# Patient Record
Sex: Female | Born: 1940
Health system: Southern US, Community
[De-identification: ages and names within clinical notes are randomized; demographics above are authoritative.]

## PROBLEM LIST (undated history)

## (undated) ENCOUNTER — Emergency Department (HOSPITAL_COMMUNITY): Admission: EM | Payer: Medicare Other | Source: Home / Self Care

## (undated) DIAGNOSIS — G4719 Other hypersomnia: Secondary | ICD-10-CM

## (undated) DIAGNOSIS — K760 Fatty (change of) liver, not elsewhere classified: Secondary | ICD-10-CM

## (undated) DIAGNOSIS — I2510 Atherosclerotic heart disease of native coronary artery without angina pectoris: Secondary | ICD-10-CM

## (undated) DIAGNOSIS — I679 Cerebrovascular disease, unspecified: Secondary | ICD-10-CM

## (undated) DIAGNOSIS — K219 Gastro-esophageal reflux disease without esophagitis: Secondary | ICD-10-CM

## (undated) DIAGNOSIS — I480 Paroxysmal atrial fibrillation: Secondary | ICD-10-CM

## (undated) DIAGNOSIS — Z950 Presence of cardiac pacemaker: Secondary | ICD-10-CM

## (undated) DIAGNOSIS — I10 Essential (primary) hypertension: Secondary | ICD-10-CM

## (undated) DIAGNOSIS — R269 Unspecified abnormalities of gait and mobility: Secondary | ICD-10-CM

## (undated) DIAGNOSIS — E785 Hyperlipidemia, unspecified: Secondary | ICD-10-CM

## (undated) DIAGNOSIS — I495 Sick sinus syndrome: Secondary | ICD-10-CM

## (undated) DIAGNOSIS — D126 Benign neoplasm of colon, unspecified: Secondary | ICD-10-CM

## (undated) DIAGNOSIS — M4800 Spinal stenosis, site unspecified: Secondary | ICD-10-CM

## (undated) DIAGNOSIS — K449 Diaphragmatic hernia without obstruction or gangrene: Secondary | ICD-10-CM

## (undated) DIAGNOSIS — K317 Polyp of stomach and duodenum: Secondary | ICD-10-CM

## (undated) DIAGNOSIS — G4710 Hypersomnia, unspecified: Secondary | ICD-10-CM

## (undated) DIAGNOSIS — G40909 Epilepsy, unspecified, not intractable, without status epilepticus: Secondary | ICD-10-CM

## (undated) DIAGNOSIS — N390 Urinary tract infection, site not specified: Secondary | ICD-10-CM

## (undated) HISTORY — DX: Polyp of stomach and duodenum: K31.7

## (undated) HISTORY — DX: Gastro-esophageal reflux disease without esophagitis: K21.9

## (undated) HISTORY — DX: Hyperlipidemia, unspecified: E78.5

## (undated) HISTORY — DX: Essential (primary) hypertension: I10

## (undated) HISTORY — DX: Unspecified abnormalities of gait and mobility: R26.9

## (undated) HISTORY — DX: Hypersomnia, unspecified: G47.10

## (undated) HISTORY — DX: Cerebrovascular disease, unspecified: I67.9

## (undated) HISTORY — DX: Paroxysmal atrial fibrillation: I48.0

## (undated) HISTORY — DX: Other hypersomnia: G47.19

## (undated) HISTORY — PX: BACK SURGERY: SHX140

## (undated) HISTORY — DX: Spinal stenosis, site unspecified: M48.00

## (undated) HISTORY — DX: Atherosclerotic heart disease of native coronary artery without angina pectoris: I25.10

## (undated) HISTORY — DX: Sick sinus syndrome: I49.5

## (undated) HISTORY — DX: Benign neoplasm of colon, unspecified: D12.6

## (undated) HISTORY — DX: Urinary tract infection, site not specified: N39.0

## (undated) HISTORY — DX: Fatty (change of) liver, not elsewhere classified: K76.0

## (undated) HISTORY — DX: Diaphragmatic hernia without obstruction or gangrene: K44.9

## (undated) HISTORY — DX: Epilepsy, unspecified, not intractable, without status epilepticus: G40.909

## (undated) SURGERY — ECHOCARDIOGRAM, TRANSESOPHAGEAL
Anesthesia: Moderate Sedation

---

## 1984-11-05 HISTORY — PX: MITRAL VALVE REPLACEMENT: SHX147

## 1992-11-05 HISTORY — PX: AORTIC AND MITRAL VALVE REPLACEMENT: SHX5056

## 1998-02-01 ENCOUNTER — Encounter (HOSPITAL_COMMUNITY): Admission: RE | Admit: 1998-02-01 | Discharge: 1998-05-02 | Payer: Self-pay | Admitting: Cardiology

## 1998-05-20 ENCOUNTER — Encounter (HOSPITAL_COMMUNITY): Admission: RE | Admit: 1998-05-20 | Discharge: 1998-08-18 | Payer: Self-pay | Admitting: Cardiology

## 1998-12-22 ENCOUNTER — Encounter: Payer: Self-pay | Admitting: Cardiology

## 1998-12-22 ENCOUNTER — Inpatient Hospital Stay (HOSPITAL_COMMUNITY): Admission: EM | Admit: 1998-12-22 | Discharge: 1998-12-24 | Payer: Self-pay | Admitting: Emergency Medicine

## 1998-12-23 ENCOUNTER — Encounter: Payer: Self-pay | Admitting: Cardiology

## 2000-01-24 ENCOUNTER — Encounter: Payer: Self-pay | Admitting: *Deleted

## 2000-01-24 ENCOUNTER — Encounter: Admission: RE | Admit: 2000-01-24 | Discharge: 2000-01-24 | Payer: Self-pay | Admitting: *Deleted

## 2000-03-20 ENCOUNTER — Other Ambulatory Visit: Admission: RE | Admit: 2000-03-20 | Discharge: 2000-03-20 | Payer: Self-pay | Admitting: *Deleted

## 2001-03-04 ENCOUNTER — Ambulatory Visit (HOSPITAL_COMMUNITY): Admission: RE | Admit: 2001-03-04 | Discharge: 2001-03-04 | Payer: Self-pay | Admitting: Cardiology

## 2001-03-14 ENCOUNTER — Encounter: Payer: Self-pay | Admitting: *Deleted

## 2001-03-14 ENCOUNTER — Encounter: Admission: RE | Admit: 2001-03-14 | Discharge: 2001-03-14 | Payer: Self-pay | Admitting: *Deleted

## 2001-06-23 ENCOUNTER — Other Ambulatory Visit: Admission: RE | Admit: 2001-06-23 | Discharge: 2001-06-23 | Payer: Self-pay | Admitting: *Deleted

## 2001-12-19 ENCOUNTER — Ambulatory Visit (HOSPITAL_BASED_OUTPATIENT_CLINIC_OR_DEPARTMENT_OTHER): Admission: RE | Admit: 2001-12-19 | Discharge: 2001-12-19 | Payer: Self-pay | Admitting: Internal Medicine

## 2002-04-23 ENCOUNTER — Encounter: Admission: RE | Admit: 2002-04-23 | Discharge: 2002-04-23 | Payer: Self-pay | Admitting: *Deleted

## 2002-04-23 ENCOUNTER — Encounter: Payer: Self-pay | Admitting: *Deleted

## 2003-02-02 ENCOUNTER — Encounter: Payer: Self-pay | Admitting: Neurology

## 2003-02-02 ENCOUNTER — Encounter: Admission: RE | Admit: 2003-02-02 | Discharge: 2003-02-02 | Payer: Self-pay | Admitting: Neurology

## 2003-12-03 ENCOUNTER — Encounter: Admission: RE | Admit: 2003-12-03 | Discharge: 2003-12-03 | Payer: Self-pay | Admitting: Family Medicine

## 2003-12-12 ENCOUNTER — Encounter: Admission: RE | Admit: 2003-12-12 | Discharge: 2003-12-12 | Payer: Self-pay | Admitting: Family Medicine

## 2004-01-17 ENCOUNTER — Inpatient Hospital Stay (HOSPITAL_COMMUNITY): Admission: RE | Admit: 2004-01-17 | Discharge: 2004-01-25 | Payer: Self-pay | Admitting: Cardiology

## 2004-12-11 ENCOUNTER — Encounter: Admission: RE | Admit: 2004-12-11 | Discharge: 2004-12-11 | Payer: Self-pay | Admitting: Neurology

## 2006-04-04 ENCOUNTER — Encounter: Admission: RE | Admit: 2006-04-04 | Discharge: 2006-04-04 | Payer: Self-pay | Admitting: Cardiology

## 2006-04-19 HISTORY — PX: NM MYOCAR PERF WALL MOTION: HXRAD629

## 2007-06-02 ENCOUNTER — Encounter: Admission: RE | Admit: 2007-06-02 | Discharge: 2007-06-27 | Payer: Self-pay | Admitting: Orthopedic Surgery

## 2007-09-09 ENCOUNTER — Encounter: Admission: RE | Admit: 2007-09-09 | Discharge: 2007-09-09 | Payer: Self-pay | Admitting: Cardiology

## 2008-03-31 HISTORY — PX: NM MYOCAR PERF WALL MOTION: HXRAD629

## 2008-04-02 ENCOUNTER — Encounter: Admission: RE | Admit: 2008-04-02 | Discharge: 2008-04-02 | Payer: Self-pay | Admitting: Cardiology

## 2008-04-09 ENCOUNTER — Encounter (INDEPENDENT_AMBULATORY_CARE_PROVIDER_SITE_OTHER): Payer: Self-pay | Admitting: Cardiology

## 2008-04-09 ENCOUNTER — Ambulatory Visit (HOSPITAL_COMMUNITY): Admission: RE | Admit: 2008-04-09 | Discharge: 2008-04-09 | Payer: Self-pay | Admitting: Cardiology

## 2009-04-14 ENCOUNTER — Encounter: Admission: RE | Admit: 2009-04-14 | Discharge: 2009-04-14 | Payer: Self-pay | Admitting: Neurology

## 2010-04-05 HISTORY — PX: CARDIAC CATHETERIZATION: SHX172

## 2010-04-26 ENCOUNTER — Inpatient Hospital Stay (HOSPITAL_COMMUNITY): Admission: AD | Admit: 2010-04-26 | Discharge: 2010-05-11 | Payer: Self-pay | Admitting: Cardiology

## 2010-04-27 ENCOUNTER — Encounter (INDEPENDENT_AMBULATORY_CARE_PROVIDER_SITE_OTHER): Payer: Self-pay | Admitting: Cardiology

## 2010-05-02 ENCOUNTER — Encounter (INDEPENDENT_AMBULATORY_CARE_PROVIDER_SITE_OTHER): Payer: Self-pay | Admitting: Cardiovascular Disease

## 2010-05-05 DIAGNOSIS — Z950 Presence of cardiac pacemaker: Secondary | ICD-10-CM

## 2010-05-05 HISTORY — PX: PACEMAKER PLACEMENT: SHX43

## 2010-05-05 HISTORY — DX: Presence of cardiac pacemaker: Z95.0

## 2010-05-15 ENCOUNTER — Emergency Department (HOSPITAL_COMMUNITY): Admission: EM | Admit: 2010-05-15 | Discharge: 2010-05-15 | Payer: Self-pay | Admitting: Emergency Medicine

## 2010-05-23 ENCOUNTER — Inpatient Hospital Stay (HOSPITAL_COMMUNITY): Admission: EM | Admit: 2010-05-23 | Discharge: 2010-05-25 | Payer: Self-pay | Admitting: Emergency Medicine

## 2010-05-23 ENCOUNTER — Ambulatory Visit: Payer: Self-pay | Admitting: Family Medicine

## 2010-06-13 ENCOUNTER — Ambulatory Visit (HOSPITAL_COMMUNITY): Admission: RE | Admit: 2010-06-13 | Discharge: 2010-06-13 | Payer: Self-pay | Admitting: Cardiology

## 2010-08-25 ENCOUNTER — Encounter (INDEPENDENT_AMBULATORY_CARE_PROVIDER_SITE_OTHER): Payer: Self-pay | Admitting: *Deleted

## 2010-10-09 ENCOUNTER — Ambulatory Visit: Payer: Self-pay | Admitting: Gastroenterology

## 2010-10-09 DIAGNOSIS — K7689 Other specified diseases of liver: Secondary | ICD-10-CM | POA: Insufficient documentation

## 2010-10-09 DIAGNOSIS — I4891 Unspecified atrial fibrillation: Secondary | ICD-10-CM | POA: Insufficient documentation

## 2010-10-16 ENCOUNTER — Encounter: Payer: Self-pay | Admitting: Gastroenterology

## 2010-10-17 ENCOUNTER — Telehealth: Payer: Self-pay | Admitting: Gastroenterology

## 2010-11-01 ENCOUNTER — Telehealth: Payer: Self-pay | Admitting: Gastroenterology

## 2010-11-02 ENCOUNTER — Encounter: Payer: Self-pay | Admitting: Gastroenterology

## 2010-11-05 DIAGNOSIS — D126 Benign neoplasm of colon, unspecified: Secondary | ICD-10-CM

## 2010-11-05 HISTORY — DX: Benign neoplasm of colon, unspecified: D12.6

## 2010-11-10 ENCOUNTER — Other Ambulatory Visit: Payer: Self-pay | Admitting: Gastroenterology

## 2010-11-10 ENCOUNTER — Ambulatory Visit
Admission: RE | Admit: 2010-11-10 | Discharge: 2010-11-10 | Payer: Self-pay | Source: Home / Self Care | Attending: Gastroenterology | Admitting: Gastroenterology

## 2010-11-10 LAB — CBC WITH DIFFERENTIAL/PLATELET
Basophils Absolute: 0 10*3/uL (ref 0.0–0.1)
Basophils Relative: 0.4 % (ref 0.0–3.0)
Eosinophils Absolute: 0.1 10*3/uL (ref 0.0–0.7)
Eosinophils Relative: 1.9 % (ref 0.0–5.0)
HCT: 35 % — ABNORMAL LOW (ref 36.0–46.0)
Hemoglobin: 12 g/dL (ref 12.0–15.0)
Lymphocytes Relative: 23.9 % (ref 12.0–46.0)
Lymphs Abs: 1 10*3/uL (ref 0.7–4.0)
MCHC: 34.4 g/dL (ref 30.0–36.0)
MCV: 94.6 fl (ref 78.0–100.0)
Monocytes Absolute: 0.2 10*3/uL (ref 0.1–1.0)
Monocytes Relative: 5.9 % (ref 3.0–12.0)
Neutro Abs: 2.8 10*3/uL (ref 1.4–7.7)
Neutrophils Relative %: 67.9 % (ref 43.0–77.0)
Platelets: 164 10*3/uL (ref 150.0–400.0)
RBC: 3.7 Mil/uL — ABNORMAL LOW (ref 3.87–5.11)
RDW: 12.6 % (ref 11.5–14.6)
WBC: 4.1 10*3/uL — ABNORMAL LOW (ref 4.5–10.5)

## 2010-11-10 LAB — PROTIME-INR
INR: 3.6 ratio — ABNORMAL HIGH (ref 0.8–1.0)
Prothrombin Time: 35.5 s — ABNORMAL HIGH (ref 10.2–12.4)

## 2010-11-14 ENCOUNTER — Ambulatory Visit
Admission: RE | Admit: 2010-11-14 | Discharge: 2010-11-14 | Payer: Self-pay | Source: Home / Self Care | Attending: Gastroenterology | Admitting: Gastroenterology

## 2010-11-14 ENCOUNTER — Other Ambulatory Visit: Payer: Self-pay | Admitting: Gastroenterology

## 2010-11-14 LAB — COMPREHENSIVE METABOLIC PANEL
ALT: 26 U/L (ref 0–35)
AST: 43 U/L — ABNORMAL HIGH (ref 0–37)
Albumin: 3.6 g/dL (ref 3.5–5.2)
Alkaline Phosphatase: 59 U/L (ref 39–117)
BUN: 12 mg/dL (ref 6–23)
CO2: 30 mEq/L (ref 19–32)
Calcium: 9 mg/dL (ref 8.4–10.5)
Chloride: 101 mEq/L (ref 96–112)
Creatinine, Ser: 1.1 mg/dL (ref 0.4–1.2)
GFR: 55.38 mL/min — ABNORMAL LOW (ref >60.00)
Glucose, Bld: 73 mg/dL (ref 70–99)
Potassium: 3.5 mEq/L (ref 3.5–5.1)
Sodium: 141 mEq/L (ref 135–145)
Total Bilirubin: 0.7 mg/dL (ref 0.3–1.2)
Total Protein: 6.3 g/dL (ref 6.0–8.3)

## 2010-11-20 ENCOUNTER — Encounter: Payer: Self-pay | Admitting: Gastroenterology

## 2010-12-05 NOTE — Letter (Signed)
Summary: [facility] Instructions  [facility]  [address] [place], [place] [address]   Phone: [phone]  Fax: [phone]       Alice Ballard    Jan 04, 1941    MRN: [id number]        Procedure Day /Date: Tuesday January 10th, 2012     Arrival Time: 2:00pm     Procedure Time: 3:00pm     Location of Procedure:                    _x _  [facility] (4th Floor)                        PREPARATION FOR COLONOSCOPY WITH MOVIPREP   Starting 5 days prior to your procedure 11/09/10 do not eat nuts, seeds, popcorn, corn, beans, peas,  salads, or any raw vegetables.  Do not take any fiber supplements (e.g. Metamucil, Citrucel, and Benefiber).  THE DAY BEFORE YOUR PROCEDURE         DATE: 11/13/10  DAY: Monday  1.  Drink clear liquids the entire day-NO SOLID FOOD  2.  Do not drink anything colored red or purple.  Avoid juices with pulp.  No orange juice.  3.  Drink at least 64 oz. (8 glasses) of fluid/clear liquids during the day to prevent dehydration and help the prep work efficiently.  CLEAR LIQUIDS INCLUDE: Water Jello Ice Popsicles Tea (sugar ok, no milk/cream) Powdered fruit flavored drinks Coffee (sugar ok, no milk/cream) Gatorade Juice: apple, white grape, white cranberry  Lemonade Clear bullion, consomm, broth Carbonated beverages (any kind) Strained chicken noodle soup Hard Candy                             4.  In the morning, mix first dose of MoviPrep solution:    Empty 1 Pouch A and 1 Pouch B into the disposable container    Add lukewarm drinking water to the top line of the container. Mix to dissolve    Refrigerate (mixed solution should be used within 24 hrs)  5.  Begin drinking the prep at 5:00 p.m. The MoviPrep container is divided by 4 marks.   Every 15 minutes drink the solution down to the next mark (approximately 8 oz) until the full liter is complete.   6.  Follow completed prep with 16 oz of clear liquid of your choice  (Nothing red or purple).  Continue to drink clear liquids until bedtime.  7.  Before going to bed, mix second dose of MoviPrep solution:    Empty 1 Pouch A and 1 Pouch B into the disposable container    Add lukewarm drinking water to the top line of the container. Mix to dissolve    Refrigerate  THE DAY OF YOUR PROCEDURE      DATE: 11/14/10 EPP:IRJJOAC  Beginning at 10:00 a.m. (5 hours before procedure):         1. Every 15 minutes, drink the solution down to the next mark (approx 8 oz) until the full liter is complete.  2. Follow completed prep with 16 oz. of clear liquid of your choice.    3. You may drink clear liquids until 1:00pm (2 HOURS BEFORE PROCEDURE).   MEDICATION INSTRUCTIONS  Unless otherwise instructed, you should take regular prescription medications with a small sip of water   as early as possible the morning of your procedure.  Stop taking Coumadin on  _ _  (5 days before procedure).  Additional medication instructions: You will be contaced by our office prior to your procedure for directions on holding your Coumadin/Warfarin.  If you do not hear from our office 1 week prior to your scheduled procedure, please call [phone] to discuss.          OTHER INSTRUCTIONS  You will need a responsible adult at least 69 years of age to accompany you and drive you home.   This person must remain in the waiting room during your procedure.  Wear loose fitting clothing that is easily removed.  Leave jewelry and other valuables at home.  However, you may wish to bring a book to read or  an iPod/MP3 player to listen to music as you wait for your procedure to start.  Remove all body piercing jewelry and leave at home.  Total time from sign-in until discharge is approximately 2-3 hours.  You should go home directly after your procedure and rest.  You can resume normal activities the  day after your procedure.  The day of your procedure you should not:    Drive   Make legal decisions   Operate machinery   Drink alcohol   Return to work  You will receive specific instructions about eating, activities and medications before you leave.    The above instructions have been reviewed and explained to me by   Marchelle Folks.     I fully understand and can verbalize these instructions _____________________________ Date _________

## 2010-12-05 NOTE — Letter (Signed)
Summary: New Patient letter  [facility]  [address] [place], [place] [address]   Phone: [phone]  Fax: [phone]       08/25/2010 MRN: [id number]  Alice Ballard [address] [place], [place]  [address]  Dear Alice Ballard,  Welcome to the Gastroenterology Division at [facility].    You are scheduled to see Dr.  Russella Dar on 10-09-10 at 11:00a.m. on the 3rd floor at [facility], [address]  We ask that you try to arrive at our office 15 minutes prior to your appointment time to allow for check-in.  We would like you to complete the enclosed self-administered evaluation form prior to your visit and bring it with you on the day of your appointment.  We will review it with you.  Also, please bring a complete list of all your medications or, if you prefer, bring the medication bottles and we will list them.  Please bring your insurance card so that we may make a copy of it.  If your insurance requires a referral to see a specialist, please bring your referral form from your primary care physician.  Co-payments are due at the time of your visit and may be paid by cash, check or credit card.     Your office visit will consist of a consult with your physician (includes a physical exam), any laboratory testing he/she may order, scheduling of any necessary diagnostic testing (e.g. x-ray, ultrasound, CT-scan), and scheduling of a procedure (e.g. Endoscopy, Colonoscopy) if required.  Please allow enough time on your schedule to allow for any/all of these possibilities.    If you cannot keep your appointment, please call [phone] to cancel or reschedule prior to your appointment date.  This allows [place] the opportunity to schedule an appointment for another patient in need of care.  If you do not cancel or reschedule by 5 p.m. the business day prior to your appointment date, you will be charged a $50.00 late cancellation/no-show fee.    Thank you for choosing [facility] for your medical needs.  We appreciate the opportunity to care for you.  Please visit [place] at our website  to learn more about our practice.                     Sincerely,                                                             The Gastroenterology Division

## 2010-12-05 NOTE — Assessment & Plan Note (Signed)
Summary: discuss COL on BT--ch.   History of Present Illness Visit Type: Initial Consult Primary GI MD: Elie Goody MD [facility] Primary Provider: Quintin Alto, MD Requesting Provider: Quintin Alto, MD Chief Complaint: Screening colonoscopy-pt. on blood thinner History of Present Illness:   This is a 70 year old female who was referred for consideration of colorectal cancer screening. She has multiple comorbidities, including atrial fibrillation, coronary artery disease, and 2 prosthetic valves. She has a St. Jude's aortic valve replacement and mitral valve replacement. She was hospitalized in July, related to seizure medication side effects. She relates her Coumadin dosing as been adjusted multiple times recently by Dr. Clarene Duke. She notes occasional difficulty swallowing large pills and occasional dry mouth for about the past year. She denies any reflux symptoms and denies solids or liquid dysphagia.   GI Review of Systems    Reports weight loss.      Denies abdominal pain, acid reflux, belching, bloating, chest pain, dysphagia with liquids, dysphagia with solids, heartburn, loss of appetite, nausea, vomiting, vomiting blood, and  weight gain.        Denies anal fissure, black tarry stools, change in bowel habit, constipation, diarrhea, diverticulosis, fecal incontinence, heme positive stool, hemorrhoids, irritable bowel syndrome, jaundice, light color stool, liver problems, rectal bleeding, and  rectal pain. Current Medications (verified): 1)  Keppra 500 Mg Tabs (Levetiracetam) .... One Tablet By Mouth Two Times A Day 2)  Amiodarone Hcl 200 Mg Tabs (Amiodarone Hcl) .... One Tablet By Mouth Once Daily 3)  Coumadin 7.5 Mg Tabs (Warfarin Sodium) .[name].. 1 1/2 Tablet By Mouth Once Daily 4)  Lipitor 40 Mg Tabs (Atorvastatin Calcium) .... One Tablet By Mouth At Bedtime 5)  Lisinopril 20 Mg Tabs (Lisinopril) .... One Tablet By Mouth Two Times A Day 6)  Prilosec 20 Mg Cpdr (Omeprazole) .... One  Tablet By Mouth Once Daily 7)  Acetaminophen 500 Mg Tabs (Acetaminophen) .... 2 Tablets By Mouth Every 6 Hours As Needed 8)  Vitamin D3 2000 Unit Caps (Cholecalciferol) .... One Tablet By Mouth Once Daily 9)  Artificial Tears 1.4 % Soln (Polyvinyl Alcohol) .... 2 Drops in Both Eyes Once Daily As Needed 10)  Hydrochlorothiazide 25 Mg Tabs (Hydrochlorothiazide) .[name].. 1 By Mouth Once Daily 11)  Vitamin C 1000 Mg Tabs (Ascorbic Acid) .[name].. 1 By Mouth Once Daily  Allergies (verified): 1)  ! Carbamazepine (Carbamazepine)  Past History:  Past Medical History: Reviewed history from 10/05/2010 and no changes required. Atrial Fibrillation Coronary Artery Disease Hyperlipidemia Hypertension Seizures CVA GERD  Past Surgical History: Reviewed history from 10/05/2010 and no changes required. Pacemaker placement 05/2010 St. Juse aortic valve replacement 1994 Mitral valve replacement 1986 Cardiac cath 04/2010  Family History: Reviewed history and no changes required. No FH of Colon Cancer: Family History of Heart Disease:   Social History: Reviewed history and no changes required. Married 1 girl Retired Patient has never smoked.  Alcohol Use - no Daily Caffeine Use Illicit Drug Use - no Smoking Status:  never Drug Use:  no  Review of Systems       The patient complains of cough, swelling of feet/legs, and urine leakage.         The pertinent positives and negatives are noted as above and in the HPI. All other ROS were reviewed and were negative.   Vital Signs:  Patient profile:   69 year old female Height:      61.75 inches Weight:      145 pounds BMI:  26.83 Pulse rate:   76 / minute Pulse rhythm:   regular BP sitting:   138 / 70  (left arm)  Vitals Entered By: Milford Cage NCMA (October 09, 2010 11:14 AM)  Physical Exam  General:  Well developed, well nourished, no acute distress. Head:  Normocephalic and atraumatic. Eyes:  PERRLA, no icterus. Ears:  Normal  auditory acuity. Mouth:  No deformity or lesions, dentition normal. Neck:  Supple; no masses or thyromegaly. Lungs:  Clear throughout to auscultation. Heart:  Nno murmurs, rubs,  or bruits. Prosthetic valve sounds. irregular rhythm, normal rate.   Abdomen:  Soft, nontender and nondistended. No masses, hepatosplenomegaly or hernias noted. Normal bowel sounds. Rectal:  deferred until time of colonoscopy.   Msk:  Symmetrical with no gross deformities. Normal posture. Pulses:  Normal pulses noted. Extremities:  No clubbing, cyanosis, edema or deformities noted. Neurologic:  Alert and  oriented x4;  grossly normal neurologically. Cervical Nodes:  No significant cervical adenopathy. Inguinal Nodes:  No significant inguinal adenopathy. Psych:  Alert and cooperative. Normal mood and affect.  Impression & Recommendations:  Problem # 1:  SPECIAL SCREENING FOR MALIGNANT NEOPLASMS COLON (ICD-V76.51) Average risk for colorectal cancer. The risks, benefits and alternatives to colonoscopy with possible biopsy and possible polypectomy were discussed with the patient and they consent to proceed. The procedure will be scheduled electively. Orders: Colonoscopy (Colon)  Problem # 2:  LONG-TERM (CURRENT) USE OF ANTICOAGULANTS (ICD-V58.61) Long-term Coumadin anticoagulation for prosthetic aortic valve, prosthetic mitral valve and atrial fibrillation. I recommend a five-day hold of Coumadin anticoagulation to allow for possible biopsy, and possible polypectomy at the time of colonoscopy, however, she will be at increased risk of thromboembolic problems off Coumadin. I will ask Dr. Clarene Duke to review and make recommendations regarding anticoagulation management and the use of Lovenox bridging, if needed.  Problem # 3:  ATRIAL FIBRILLATION (ICD-427.31)  Patient Instructions: 1)  Pick up your prep from your pharmacy.  2)  Colonoscopy brochure given.  3)  Copy sent to : Quintin Alto, MD 4)                           Al Little, MD 5)  The medication list was reviewed and reconciled.  All changed / newly prescribed medications were explained.  A complete medication list was provided to the patient / caregiver.  Prescriptions: MOVIPREP 100 GM  SOLR (PEG-KCL-NACL-NASULF-NA ASC-C) As per prep instructions.  #1 x 0   Entered by:   Christie Nottingham CMA (AAMA)   Authorized by:   Meryl Dare MD [facility]   Signed by:   Christie Nottingham CMA (AAMA) on 10/09/2010   Method used:   Electronically to        [facility]* (retail)       [address]       [place], [place]  [address]       Ph: [phone]       Fax: [phone]   RxID:   [id number]

## 2010-12-07 NOTE — Procedures (Signed)
Summary: Colon prep  Colon prep   Imported By: Lester [place] 11/09/2010 10:04:07  _____________________________________________________________________  External Attachment:    Type:   Image     Comment:   External Document

## 2010-12-07 NOTE — Letter (Signed)
Summary: Patient Notice- Polyp Results  [facility]  [address] [place], [place] [address]   Phone: [phone]  Fax: [phone]        November 20, 2010 MRN: [id number]    [facility] [address] [place], [place]  [address]    Dear Ms. Sundquist,  I am pleased to inform you that the colon polyp removed during your recent colonoscopy was found to be benign (no cancer detected) upon pathologic examination.  I recommend you have a repeat colonoscopy examination in 5 years to look for recurrent polyps, as having colon polyps increases your risk for having recurrent polyps or even colon cancer in the future.  Should you develop new or worsening symptoms of abdominal pain, bowel habit changes or bleeding from the rectum or bowels, please schedule an evaluation with either your primary care physician or with me.  Continue treatment plan as outlined the day of your exam.  Please call us if you are having persistent problems or have questions about your condition that have not been fully answered at this time.  Sincerely,  Meryl Dare MD [facility]  This letter has been electronically signed by your physician.  Appended Document: Patient Notice- Polyp Results Letter mailed

## 2010-12-07 NOTE — Progress Notes (Signed)
Summary: Stay on coumadin  ---- Converted from flag ---- ---- 10/17/2010 8:48 AM, Meryl Dare MD [facility] wrote: She can do colonoscopy while on Coumadin but we will not be able to remove larger polyps. Should have a PT/INR between 2-4 days prior to colonoscopy. MS  ---- 10/16/2010 4:35 PM, Christie Nottingham CMA (AAMA) wrote: Got anticoagulant letter back from Dr. Clarene Duke that patient can not come off their coumadin before there colonoscopy. I just wanted to let you know. I will call patient and tell her she can not come off. ------------------------------  Phone Note Outgoing Call Call back at Home Phone [phone]   Call placed by: Christie Nottingham CMA Duncan Dull),  October 17, 2010 9:02 AM Call placed to: Patient Summary of Call: Called pt to notify her to stay on coumadin for her colonoscopy. Notified her that we can not remove large polyps but we can still remove smallerpolyps within the colon.   Pt agreed and verbalized understanding. Pt is to come into our lab on 11/10/2010 for a PT/INR before procedure. Labs put in IDX.  Pt agreed.  Initial call taken by: Christie Nottingham CMA [facility]),  October 17, 2010 9:05 AM

## 2010-12-07 NOTE — Letter (Signed)
Summary: Anticoagulation/[facility]  Anticoagulation/[facility]   Imported By: Sherian Rein 10/20/2010 14:15:56  _____________________________________________________________________  External Attachment:    Type:   Image     Comment:   External Document

## 2010-12-07 NOTE — Procedures (Signed)
Summary: Colonoscopy  Patient: Alice Ballard Note: All result statuses are Final unless otherwise noted.  Tests: (1) Colonoscopy (COL)   COL Colonoscopy           DONE     [facility]     [address]     [place], [place]  [address]           COLONOSCOPY PROCEDURE REPORT     PATIENT:  Alice Ballard, Alice Ballard  MR#:  [id number]     BIRTHDATE:  23-Oct-1941, 69 yrs. old  GENDER:  female     ENDOSCOPIST:  Judie Petit T. Russella Dar, MD, [facility]     Referred by:  Quintin Alto, M.D.     PROCEDURE DATE:  11/14/2010     PROCEDURE:  Colonoscopy with snare polypectomy     ASA CLASS:  Class III     INDICATIONS:  1) Routine Risk Screening     MEDICATIONS:   Fentanyl 75 mcg IV, Versed 7 mg IV     DESCRIPTION OF PROCEDURE:   After the risks benefits and     alternatives of the procedure were thoroughly explained, informed     consent was obtained.  Digital rectal exam was performed and     revealed no abnormalities.   The LB [id number] endoscope     was introduced through the anus and advanced to the cecum, which     was identified by both the appendix and ileocecal valve, without     limitations.  The quality of the prep was good, using MoviPrep.     The instrument was then slowly withdrawn as the colon was fully     examined.     <<PROCEDUREIMAGES>>     FINDINGS:  A sessile polyp was found in the ascending colon. It     was 5 mm in size. Polyp was snared without cautery. Retrieval was     successful. A normal appearing cecum, ileocecal valve, and     appendiceal orifice were identified. The hepatic flexure,     transverse, splenic flexure, descending, sigmoid colon, and rectum     appeared unremarkable. Retroflexed views in the rectum revealed     internal hemorrhoids, small.  The time to cecum =  3.25  minutes.     The scope was then withdrawn (time =  8.5  min) from the patient     and the procedure completed.           COMPLICATIONS:  None           ENDOSCOPIC IMPRESSION:     1) 5 mm sessile  polyp in the ascending colon     2) Internal hemorrhoids           RECOMMENDATIONS:     1) Hold aspirin, aspirin products, and anti-inflamatory     medication for 2 weeks.     2) Await pathology results.     3) Resume Coumadin (warfarin) today and have your PT/INR checked     as scheduled.     4) If the polyp is adenomatous (pre-cancerous), colonoscopy in 5     years. Otherwise follow colorectal cancer screening guidelines for     "routine risk" patients with colonoscopy in 10 years.           Venita Lick. Russella Dar, MD, Clement[facility] Graham           n.     eSIGNED:   Venita Lick. Joanne Brander at 11/14/2010 03:15 PM  Alice Ballard, Alice Ballard, [id number]  Note: An exclamation mark (!) indicates a result that was not dispersed into the flowsheet. Document Creation Date: 11/14/2010 3:16 PM _______________________________________________________________________  (1) Order result status: Final Collection or observation date-time: 11/14/2010 15:11 Requested date-time:  Receipt date-time:  Reported date-time:  Referring Physician:   Ordering Physician: Claudette Head [phone]) Specimen Source:  Source: Launa Grill Order Number: [id number] Lab site:   Appended Document: Colonoscopy Recall     Procedures Next Due Date:    Colonoscopy: 12/2015

## 2010-12-07 NOTE — Progress Notes (Signed)
Summary: Medication  Phone Note Call from Patient Call back at Home Phone [phone]   Caller: Patient Call For: Dr. Russella Dar Reason for Call: Talk to Nurse Summary of Call: Moviprep is too expensive...Marland Kitchenneeds an alternative med. Initial call taken by: Karna Christmas,  November 01, 2010 3:23 PM  Follow-up for Phone Call        Left message to call back. Follow-up by: Christie Nottingham CMA Duncan Dull),  November 01, 2010 3:41 PM     Appended Document: Medication Pt wants to be swtiched to the [facility] prep so she can afford the prep. She is on a fixed income and cannot afford Movi prep. Told pt I would mail the new prep instructions to her and if she has any questions once she has recieved the prep instructions then she needs to call the office. Pt verbalized understanding. Rx was sent to pts pharmacy for the new prep.   Clinical Lists Changes  Medications: Changed medication from MOVIPREP 100 GM  SOLR (PEG-KCL-NACL-NASULF-NA ASC-C) As per prep instructions. to GOLYTELY 236 GM SOLR (PEG 3350-KCL-NABCB-NACL-NASULF) take as directed per prep instructions - Signed Rx of GOLYTELY 236 GM SOLR (PEG 3350-KCL-NABCB-NACL-NASULF) take as directed per prep instructions;  #1 x 0;  Signed;  Entered by: Christie Nottingham CMA (AAMA);  Authorized by: Meryl Dare MD [facility];  Method used: Electronically to [facility] [address], [place], [place], [place]  [address], Ph: [phone], Fax: [phone]    Prescriptions: GOLYTELY 236 GM SOLR (PEG 3350-KCL-NABCB-NACL-NASULF) take as directed per prep instructions  #1 x 0   Entered by:   Christie Nottingham CMA (AAMA)   Authorized by:   Meryl Dare MD [facility]   Signed by:   Christie Nottingham CMA (AAMA) on 11/02/2010   Method used:   Electronically to        [facility]* (retail)       [address]       [place], [place]  [address]       Ph: [phone]       Fax: [phone]   RxID:   [id number]

## 2010-12-07 NOTE — Letter (Signed)
Summary: Anticoagulation Modification Letter  [facility]  [address] [place], [place] [address]   Phone: [phone]  Fax: [phone]    October 09, 2010  Re:    ESTI DEMELLO DOB:    December 29, 1940 MRN:    [id number]    Dear Dr. Clarene Duke,  We have scheduled the above patient for an endoscopic procedure. Our records show that  he/she is on anticoagulation therapy. Please advise as to how long the patient may come off their therapy of coumadin prior to the scheduled procedure(s) on 11/14/10.   Please fax back/or route the completed form to [place] at [phone].  Thank you for your help with this matter.  Sincerely,  Christie Nottingham CMA Duncan Dull)   Physician Recommendation:  Hold Plavix 7 days prior ________________  Hold Coumadin 5 days prior ____________  Other ______________________________     Appended Document: Anticoagulation Modification Letter See phone note from 10/17/10.

## 2010-12-15 ENCOUNTER — Other Ambulatory Visit: Payer: Self-pay

## 2011-01-19 LAB — PROTIME-INR: INR: 6.01 (ref 0.00–1.49)

## 2011-01-20 LAB — BASIC METABOLIC PANEL
CO2: 27 mEq/L (ref 19–32)
Calcium: 9.2 mg/dL (ref 8.4–10.5)
Chloride: 96 mEq/L (ref 96–112)
Creatinine, Ser: 0.63 mg/dL (ref 0.4–1.2)
GFR calc Af Amer: >60 mL/min (ref >60)
GFR calc non Af Amer: >60 mL/min (ref >60)
Glucose, Bld: 99 mg/dL (ref 70–99)
Potassium: 4.6 mEq/L (ref 3.5–5.1)
Sodium: 130 mEq/L — ABNORMAL LOW (ref 135–145)

## 2011-01-20 LAB — PROTIME-INR
INR: 2.79 — ABNORMAL HIGH (ref 0.00–1.49)
INR: 3.53 — ABNORMAL HIGH (ref 0.00–1.49)
INR: 3.86 — ABNORMAL HIGH (ref 0.00–1.49)
Prothrombin Time: 35.1 seconds — ABNORMAL HIGH (ref 11.6–15.2)
Prothrombin Time: 37.6 seconds — ABNORMAL HIGH (ref 11.6–15.2)

## 2011-01-20 LAB — DIGOXIN LEVEL: Digoxin Level: <0.2 ng/mL — ABNORMAL LOW (ref 0.8–2.0)

## 2011-01-20 LAB — CBC
HCT: 36.7 % (ref 36.0–46.0)
Hemoglobin: 12.4 g/dL (ref 12.0–15.0)
MCH: 32.7 pg (ref 26.0–34.0)
MCHC: 33.8 g/dL (ref 30.0–36.0)
MCV: 96.7 fL (ref 78.0–100.0)
Platelets: 207 10*3/uL (ref 150–400)
RBC: 3.8 MIL/uL — ABNORMAL LOW (ref 3.87–5.11)
RDW: 12.4 % (ref 11.5–15.5)
WBC: 4.9 10*3/uL (ref 4.0–10.5)

## 2011-01-20 LAB — DIFFERENTIAL
Basophils Absolute: 0.1 10*3/uL (ref 0.0–0.1)
Basophils Relative: 1 % (ref 0–1)
Eosinophils Absolute: 0.1 10*3/uL (ref 0.0–0.7)
Eosinophils Relative: 2 % (ref 0–5)
Lymphocytes Relative: 24 % (ref 12–46)
Lymphs Abs: 1.2 10*3/uL (ref 0.7–4.0)
Monocytes Relative: 9 % (ref 3–12)
Neutro Abs: 3.1 10*3/uL (ref 1.7–7.7)
Neutrophils Relative %: 63 % (ref 43–77)

## 2011-01-20 LAB — CARBAMAZEPINE LEVEL, TOTAL: Carbamazepine Lvl: 17.6 ug/mL (ref 4.0–12.0)

## 2011-01-21 LAB — PROTIME-INR
INR: 1.37 (ref 0.00–1.49)
INR: 1.96 — ABNORMAL HIGH (ref 0.00–1.49)
INR: 1.98 — ABNORMAL HIGH (ref 0.00–1.49)
INR: 2.22 — ABNORMAL HIGH (ref 0.00–1.49)
INR: 2.45 — ABNORMAL HIGH (ref 0.00–1.49)
INR: 4.08 — ABNORMAL HIGH (ref 0.00–1.49)
INR: 1.12 (ref 0.00–1.49)
INR: 1.13 (ref 0.00–1.49)
INR: 1.3 (ref 0.00–1.49)
INR: 1.42 (ref 0.00–1.49)
INR: 1.42 (ref 0.00–1.49)
INR: 1.98 — ABNORMAL HIGH (ref 0.00–1.49)
INR: 2.09 — ABNORMAL HIGH (ref 0.00–1.49)
Prothrombin Time: 16.8 seconds — ABNORMAL HIGH (ref 11.6–15.2)
Prothrombin Time: 22.3 seconds — ABNORMAL HIGH (ref 11.6–15.2)
Prothrombin Time: 24.4 seconds — ABNORMAL HIGH (ref 11.6–15.2)
Prothrombin Time: 26.4 seconds — ABNORMAL HIGH (ref 11.6–15.2)
Prothrombin Time: 14.3 seconds (ref 11.6–15.2)
Prothrombin Time: 14.4 seconds (ref 11.6–15.2)
Prothrombin Time: 22.3 seconds — ABNORMAL HIGH (ref 11.6–15.2)
Prothrombin Time: 31.5 seconds — ABNORMAL HIGH (ref 11.6–15.2)

## 2011-01-21 LAB — COMPREHENSIVE METABOLIC PANEL
ALT: 14 U/L (ref 0–35)
CO2: 29 mEq/L (ref 19–32)
Calcium: 8.5 mg/dL (ref 8.4–10.5)
Chloride: 106 mEq/L (ref 96–112)
GFR calc non Af Amer: 58 mL/min — ABNORMAL LOW (ref >60)
Glucose, Bld: 104 mg/dL — ABNORMAL HIGH (ref 70–99)
Sodium: 139 mEq/L (ref 135–145)
Total Bilirubin: 0.6 mg/dL (ref 0.3–1.2)

## 2011-01-21 LAB — CBC
HCT: 31.6 % — ABNORMAL LOW (ref 36.0–46.0)
HCT: 31.7 % — ABNORMAL LOW (ref 36.0–46.0)
HCT: 31.8 % — ABNORMAL LOW (ref 36.0–46.0)
HCT: 32.2 % — ABNORMAL LOW (ref 36.0–46.0)
HCT: 33.7 % — ABNORMAL LOW (ref 36.0–46.0)
HCT: 36.6 % (ref 36.0–46.0)
HCT: 31.7 % — ABNORMAL LOW (ref 36.0–46.0)
HCT: 32.4 % — ABNORMAL LOW (ref 36.0–46.0)
HCT: 32.8 % — ABNORMAL LOW (ref 36.0–46.0)
HCT: 33.5 % — ABNORMAL LOW (ref 36.0–46.0)
HCT: 33.6 % — ABNORMAL LOW (ref 36.0–46.0)
Hemoglobin: 10.8 g/dL — ABNORMAL LOW (ref 12.0–15.0)
Hemoglobin: 10.8 g/dL — ABNORMAL LOW (ref 12.0–15.0)
Hemoglobin: 10.8 g/dL — ABNORMAL LOW (ref 12.0–15.0)
Hemoglobin: 10.9 g/dL — ABNORMAL LOW (ref 12.0–15.0)
Hemoglobin: 10.7 g/dL — ABNORMAL LOW (ref 12.0–15.0)
Hemoglobin: 10.8 g/dL — ABNORMAL LOW (ref 12.0–15.0)
Hemoglobin: 11 g/dL — ABNORMAL LOW (ref 12.0–15.0)
Hemoglobin: 11.2 g/dL — ABNORMAL LOW (ref 12.0–15.0)
Hemoglobin: 11.3 g/dL — ABNORMAL LOW (ref 12.0–15.0)
MCH: 32.8 pg (ref 26.0–34.0)
MCH: 33.5 pg (ref 26.0–34.0)
MCH: 33.9 pg (ref 26.0–34.0)
MCH: 33.6 pg (ref 26.0–34.0)
MCH: 33.9 pg (ref 26.0–34.0)
MCH: 34 pg (ref 26.0–34.0)
MCHC: 33.5 g/dL (ref 30.0–36.0)
MCHC: 33.8 g/dL (ref 30.0–36.0)
MCHC: 34 g/dL (ref 30.0–36.0)
MCHC: 34.3 g/dL (ref 30.0–36.0)
MCHC: 33.6 g/dL (ref 30.0–36.0)
MCHC: 34.1 g/dL (ref 30.0–36.0)
MCHC: 34.1 g/dL (ref 30.0–36.0)
MCV: 97.9 fL (ref 78.0–100.0)
MCV: 99.2 fL (ref 78.0–100.0)
MCV: 99.4 fL (ref 78.0–100.0)
MCV: 99.6 fL (ref 78.0–100.0)
MCV: 98.4 fL (ref 78.0–100.0)
MCV: 98.5 fL (ref 78.0–100.0)
MCV: 99.4 fL (ref 78.0–100.0)
MCV: 99.4 fL (ref 78.0–100.0)
MCV: 99.4 fL (ref 78.0–100.0)
MCV: 99.5 fL (ref 78.0–100.0)
Platelets: 154 10*3/uL (ref 150–400)
Platelets: 168 10*3/uL (ref 150–400)
Platelets: 153 10*3/uL (ref 150–400)
Platelets: 164 10*3/uL (ref 150–400)
Platelets: 174 10*3/uL (ref 150–400)
Platelets: 176 10*3/uL (ref 150–400)
Platelets: 177 10*3/uL (ref 150–400)
Platelets: 189 10*3/uL (ref 150–400)
RBC: 3.19 MIL/uL — ABNORMAL LOW (ref 3.87–5.11)
RBC: 3.22 MIL/uL — ABNORMAL LOW (ref 3.87–5.11)
RBC: 3.39 MIL/uL — ABNORMAL LOW (ref 3.87–5.11)
RBC: 3.21 MIL/uL — ABNORMAL LOW (ref 3.87–5.11)
RBC: 3.24 MIL/uL — ABNORMAL LOW (ref 3.87–5.11)
RBC: 3.27 MIL/uL — ABNORMAL LOW (ref 3.87–5.11)
RBC: 3.38 MIL/uL — ABNORMAL LOW (ref 3.87–5.11)
RDW: 12.3 % (ref 11.5–15.5)
RDW: 12.5 % (ref 11.5–15.5)
RDW: 12.8 % (ref 11.5–15.5)
RDW: 12.2 % (ref 11.5–15.5)
RDW: 12.2 % (ref 11.5–15.5)
RDW: 12.3 % (ref 11.5–15.5)
RDW: 12.4 % (ref 11.5–15.5)
RDW: 12.5 % (ref 11.5–15.5)
WBC: 4.9 10*3/uL (ref 4.0–10.5)
WBC: 4.9 10*3/uL (ref 4.0–10.5)
WBC: 5.1 10*3/uL (ref 4.0–10.5)
WBC: 6.1 10*3/uL (ref 4.0–10.5)
WBC: 6.3 10*3/uL (ref 4.0–10.5)
WBC: 4.7 10*3/uL (ref 4.0–10.5)
WBC: 5.1 10*3/uL (ref 4.0–10.5)
WBC: 5.4 10*3/uL (ref 4.0–10.5)
WBC: 5.4 10*3/uL (ref 4.0–10.5)
WBC: 5.5 10*3/uL (ref 4.0–10.5)
WBC: 7.2 10*3/uL (ref 4.0–10.5)

## 2011-01-21 LAB — POCT I-STAT 3, VENOUS BLOOD GAS (G3P V)
Acid-Base Excess: 1 mmol/L (ref 0.0–2.0)
Acid-Base Excess: 1 mmol/L (ref 0.0–2.0)
Acid-Base Excess: 2 mmol/L (ref 0.0–2.0)
Bicarbonate: 26.9 mEq/L — ABNORMAL HIGH (ref 20.0–24.0)
Bicarbonate: 27.2 mEq/L — ABNORMAL HIGH (ref 20.0–24.0)
Bicarbonate: 27.2 mEq/L — ABNORMAL HIGH (ref 20.0–24.0)
O2 Saturation: 58 %
TCO2: 28 mmol/L (ref 0–100)
TCO2: 29 mmol/L (ref 0–100)
TCO2: 29 mmol/L (ref 0–100)

## 2011-01-21 LAB — HEPARIN LEVEL (UNFRACTIONATED)
Heparin Unfractionated: 0.25 IU/mL — ABNORMAL LOW (ref 0.30–0.70)
Heparin Unfractionated: 0.3 IU/mL (ref 0.30–0.70)
Heparin Unfractionated: 0.47 IU/mL (ref 0.30–0.70)
Heparin Unfractionated: 0.25 IU/mL — ABNORMAL LOW (ref 0.30–0.70)
Heparin Unfractionated: 0.44 IU/mL (ref 0.30–0.70)
Heparin Unfractionated: 0.46 IU/mL (ref 0.30–0.70)
Heparin Unfractionated: 0.48 IU/mL (ref 0.30–0.70)
Heparin Unfractionated: 0.51 IU/mL (ref 0.30–0.70)

## 2011-01-21 LAB — BASIC METABOLIC PANEL
BUN: 6 mg/dL (ref 6–23)
BUN: 3 mg/dL — ABNORMAL LOW (ref 6–23)
BUN: 5 mg/dL — ABNORMAL LOW (ref 6–23)
BUN: 5 mg/dL — ABNORMAL LOW (ref 6–23)
BUN: 7 mg/dL (ref 6–23)
BUN: 7 mg/dL (ref 6–23)
CO2: 30 mEq/L (ref 19–32)
CO2: 26 mEq/L (ref 19–32)
CO2: 31 mEq/L (ref 19–32)
Calcium: 8.2 mg/dL — ABNORMAL LOW (ref 8.4–10.5)
Calcium: 8.5 mg/dL (ref 8.4–10.5)
Calcium: 8.7 mg/dL (ref 8.4–10.5)
Chloride: 108 mEq/L (ref 96–112)
Chloride: 99 mEq/L (ref 96–112)
Chloride: 103 mEq/L (ref 96–112)
Chloride: 104 mEq/L (ref 96–112)
Chloride: 106 mEq/L (ref 96–112)
Chloride: 106 mEq/L (ref 96–112)
Creatinine, Ser: 0.71 mg/dL (ref 0.4–1.2)
Creatinine, Ser: 0.75 mg/dL (ref 0.4–1.2)
Creatinine, Ser: 0.77 mg/dL (ref 0.4–1.2)
Creatinine, Ser: 0.83 mg/dL (ref 0.4–1.2)
GFR calc Af Amer: >60 mL/min (ref >60)
GFR calc Af Amer: >60 mL/min (ref >60)
GFR calc Af Amer: >60 mL/min (ref >60)
GFR calc Af Amer: >60 mL/min (ref >60)
GFR calc non Af Amer: >60 mL/min (ref >60)
GFR calc non Af Amer: >60 mL/min (ref >60)
GFR calc non Af Amer: >60 mL/min (ref >60)
GFR calc non Af Amer: >60 mL/min (ref >60)
GFR calc non Af Amer: >60 mL/min (ref >60)
Glucose, Bld: 108 mg/dL — ABNORMAL HIGH (ref 70–99)
Glucose, Bld: 101 mg/dL — ABNORMAL HIGH (ref 70–99)
Glucose, Bld: 105 mg/dL — ABNORMAL HIGH (ref 70–99)
Potassium: 4.2 mEq/L (ref 3.5–5.1)
Potassium: 4.4 mEq/L (ref 3.5–5.1)
Potassium: 3.8 mEq/L (ref 3.5–5.1)
Potassium: 4.2 mEq/L (ref 3.5–5.1)
Potassium: 4.2 mEq/L (ref 3.5–5.1)
Sodium: 143 mEq/L (ref 135–145)
Sodium: 139 mEq/L (ref 135–145)
Sodium: 140 mEq/L (ref 135–145)
Sodium: 141 mEq/L (ref 135–145)
Sodium: 141 mEq/L (ref 135–145)

## 2011-01-21 LAB — APTT: aPTT: 70 seconds — ABNORMAL HIGH (ref 24–37)

## 2011-01-21 LAB — CARDIAC PANEL(CRET KIN+CKTOT+MB+TROPI)
CK, MB: 0.9 ng/mL (ref 0.3–4.0)
CK, MB: 1 ng/mL (ref 0.3–4.0)
Relative Index: INVALID (ref 0.0–2.5)
Total CK: 62 U/L (ref 7–177)
Total CK: 69 U/L (ref 7–177)
Troponin I: 0.01 ng/mL (ref 0.00–0.06)

## 2011-01-21 LAB — LIPID PANEL
Cholesterol: 144 mg/dL (ref 0–200)
HDL: 40 mg/dL (ref >39)
LDL Cholesterol: 79 mg/dL (ref 0–99)
Total CHOL/HDL Ratio: 3.6 RATIO
Triglycerides: 125 mg/dL (ref <150)
VLDL: 25 mg/dL (ref 0–40)

## 2011-01-21 LAB — URINE MICROSCOPIC-ADD ON

## 2011-01-21 LAB — DIFFERENTIAL
Basophils Absolute: 0 10*3/uL (ref 0.0–0.1)
Basophils Relative: 0 % (ref 0–1)
Eosinophils Relative: 2 % (ref 0–5)
Monocytes Absolute: 0.5 10*3/uL (ref 0.1–1.0)

## 2011-01-21 LAB — TSH: TSH: 1.388 u[IU]/mL (ref 0.350–4.500)

## 2011-01-21 LAB — GLUCOSE, CAPILLARY: Glucose-Capillary: 118 mg/dL — ABNORMAL HIGH (ref 70–99)

## 2011-01-21 LAB — POCT I-STAT 3, ART BLOOD GAS (G3+)
Acid-Base Excess: 1 mmol/L (ref 0.0–2.0)
O2 Saturation: 90 %
pO2, Arterial: 59 mmHg — ABNORMAL LOW (ref 80.0–100.0)

## 2011-01-21 LAB — POCT CARDIAC MARKERS
CKMB, poc: <1 ng/mL — ABNORMAL LOW (ref 1.0–8.0)
Myoglobin, poc: 55.7 ng/mL (ref 12–200)

## 2011-01-21 LAB — URINALYSIS, ROUTINE W REFLEX MICROSCOPIC
Protein, ur: NEGATIVE mg/dL
Urobilinogen, UA: 0.2 mg/dL (ref 0.0–1.0)

## 2011-01-21 LAB — MAGNESIUM: Magnesium: 2.2 mg/dL (ref 1.5–2.5)

## 2011-03-23 NOTE — Discharge Summary (Signed)
Ballard, Alice                             ACCOUNT NO.:  [id number]   MEDICAL RECORD NO.:  [id number]                   PATIENT TYPE:  INP   LOCATION:  3715                                 FACILITY:  [facility]   PHYSICIAN:  Thereasa Solo. Little, M.D.              DATE OF BIRTH:  11-28-1940   DATE OF ADMISSION:  01/17/2004  DATE OF DISCHARGE:  01/25/2004                                 DISCHARGE SUMMARY   ADMISSION DIAGNOSES:  1. Right L5-S1 herniated nucleus pulposus, spinal stenosis, and lumbar     radiculopathy with lumbago.  2. St. Jude mitral valve and aortic valve mechanical valves on chronic     Coumadin.  3. Hypertension.  4. Hyperlipidemia.  5. Admit for Coumadin to heparin crossover for surgery.   DISCHARGE DIAGNOSES:  1. Right L5-S1 herniated nucleus pulposus, spinal stenosis, and lumbar     radiculopathy with lumbago.  2. St. Jude mitral valve and aortic valve mechanical valves on chronic     Coumadin.  3. Hypertension.  4. Hyperlipidemia.  5. Admit for Coumadin to heparin crossover for surgery.   PROCEDURE:  Right L5-S1 microdiskectomy using microdissection by Cristi Loron, M.D.   HISTORY OF PRESENT ILLNESS:  The patient is a 70 year old female who is  status post aortic valve replacement and mitral valve replacement both with  St. Jude mechanical valves on chronic Coumadin.  She has developed  significant back pain and MRI revealed the above noted L5-S1 herniated disk.  She was seen by Dr. Darryll Capers and is scheduled for surgical repair of  lumbar disease.  She was admitted by Dr. Clarene Duke at this time in order to  transition her from Coumadin to heparin to perfect her mitral and aortic  mechanical valves.   MEDICATIONS:  1. Coumadin 7 mg daily.  2. Lanoxin 0.25 mg daily.  3. Tegretol 300 mg t.i.d.  4. Vitamin C one daily.  5. Calcium with vitamin D 600 mg daily.  6. Lipitor 20 mg daily.   ALLERGIES:  No known drug allergies.   HOSPITAL COURSE:  The  patient was admitted.  She was placed on heparin.  Her  pro-time slowly came down and she was ultimately able to undergo surgery on  January 20, 2004.  She tolerated the procedure well and was returned to the  neurosurgical ICU.  She was started back on heparin the first postoperative  day and Coumadin was restarted with aim to get her INR between 2.5 and 3.5.  She was maintained on daily Coumadin and was monitored with daily pro-times.  She made good progress from her surgery and from her mitral and aortic valve  disease.  She continued to do well and by January 25, 2004, her INR was 2.2,  her heparin was discontinued and she was discharged home.  She resumed her  preadmission medicines as listed above along with Coumadin.  She was  scheduled for  follow-up of her pro-time and follow up with Dr. Clarene Duke in the office.  She  was scheduled to see Dr. Lovell Sheehan in four weeks.   CONDITION ON DISCHARGE:  Improved.      Eber Hong, P.A.                 Thereasa Solo. Little, M.D.    WDJ/MEDQ  D:  02/25/2004  T:  02/27/2004  Job:  [id number]   cc:   Stacie Glaze, M.D. [facility]   Talmadge Coventry, M.D.  [address]  [place]  [place] [address]  Fax: [phone]

## 2011-03-23 NOTE — Op Note (Signed)
Alice Ballard, Alice Ballard                             ACCOUNT NO.:  [id number]   MEDICAL RECORD NO.:  [id number]                   PATIENT TYPE:  INP   LOCATION:  3715                                 FACILITY:  [facility]   PHYSICIAN:  Cristi Loron, M.D.            DATE OF BIRTH:  01-Sep-1941   DATE OF PROCEDURE:  01/21/2004  DATE OF DISCHARGE:                                 OPERATIVE REPORT   PREOPERATIVE DIAGNOSIS:  Right L5-S1 herniated nucleus pulposus, spinal  stenosis, lumbar radiculopathy with lumbago.   POSTOPERATIVE DIAGNOSIS:  Right L5-S1 herniated nucleus pulposus, spinal  stenosis, lumbar radiculopathy with lumbago.   OPERATION PERFORMED:  Right L5-S1 microdiskectomy using microdissection.   SURGEON:  Cristi Loron, M.D.   ASSISTANT:  Payton Doughty, M.D.   ANESTHESIA:  General endotracheal.   ESTIMATED BLOOD LOSS:  25 mL.   SPECIMENS:  None.   DRAINS:  None.   COMPLICATIONS:  None.   INDICATIONS FOR PROCEDURE:  The patient is a 70 year old white female who  has suffered from back and right leg pain.  She has failed medical  management and was worked up with a lumbar MRI which demonstrated a  herniated disk at L5-S1 on the right.  I dicussed the various treatment  options with the patient and her husband including surgery.  The patient and  her husband weighed the risks, benefits and alternatives to surgery and  decided to proceed with microdiskectomy.   DESCRIPTION OF PROCEDURE:  The patient was brought to the operating room by  the anesthesia team.  General endotracheal anesthesia was induced.  The  patient was then turned to the prone position on the Wilson frame.  Her  lumbosacral region was then prepared with Betadine scrub and Betadine  solution and sterile drapes were applied.  I then injected the area to be  incised with Marcaine with epinephrine solution.  I used a scalpel to make a  midline incision over the L5-S1 interspace. I performed a  right-sided  subperiosteal dissection using electrocautery stripping the paraspinous  musculature from the right spinous process and lamina of L5 and the upper  sacrum.  I inserted the McCullough retractor for exposure and then obtained  intraoperative radiograph to confirm my location.  We then brought the  operating microscope into the field and under its magnification and  illumination, completed the microdissection.  We used a high speed drill to  perform a right L5 laminotomy.  We widened the laminotomy with the Kerrison  punch and removed the right L5-S1 ligamentum flavum.  I then used  microdissection to free up the thecal sac and right S1 nerve root from the  epidural tissue.  Dr. Channing Mutters gently retracted the nerve root medially with the  D'Errico retractor exposing a large underlying herniated disk.  I incised  the herniated disk with a 15 blade scalpel and removed it using pituitary  forceps.  We performed a partial intervertebral diskectomy using the  pituitary forceps and the Epstein and Scoville curets.  We then used the  osteophyte tool to remove some redundant ligament from the vertebral end  plate of V4-U9.  We then palpated along the ventral surface of the thecal  sac and along the exit route of the right S1 nerve root and noted it was  well decompressed.  WE obtained stringent hemostasis using bipolar  electrocautery.  We then copiously irrigated the wound out with bacitracin  solution.  I then removed the solution, removed the [facility] retractor.  We then reapproximated the patient's thoracolumbar fascia with interrupted  #1 Vicryl sutures, subcutaneous tissue with interrupted 2-0 Vicryl suture  and the skin with Steri-Strips and benzoin.  The wound was then coated with  bacitracin ointment, sterile dressing was applied, the drapes were removed.  The patient was subsequently returned to supine position where she was  extubated by the anesthesia team and transported to the  post anesthesia care  unit in stable condition.                                               Cristi Loron, M.D.    JDJ/MEDQ  D:  01/21/2004  T:  01/24/2004  Job:  [id number]

## 2011-07-06 ENCOUNTER — Inpatient Hospital Stay (HOSPITAL_COMMUNITY): Payer: Medicare Other

## 2011-07-06 ENCOUNTER — Emergency Department (HOSPITAL_COMMUNITY): Payer: Medicare Other

## 2011-07-06 ENCOUNTER — Inpatient Hospital Stay (HOSPITAL_COMMUNITY)
Admission: EM | Admit: 2011-07-06 | Discharge: 2011-07-11 | DRG: 394 | Disposition: A | Discharge status: Home / Self Care | Payer: Medicare Other | Attending: Cardiology | Admitting: Cardiology

## 2011-07-06 DIAGNOSIS — N390 Urinary tract infection, site not specified: Secondary | ICD-10-CM | POA: Diagnosis present

## 2011-07-06 DIAGNOSIS — B961 Klebsiella pneumoniae [K. pneumoniae] as the cause of diseases classified elsewhere: Secondary | ICD-10-CM | POA: Diagnosis present

## 2011-07-06 DIAGNOSIS — I495 Sick sinus syndrome: Secondary | ICD-10-CM | POA: Diagnosis present

## 2011-07-06 DIAGNOSIS — I2510 Atherosclerotic heart disease of native coronary artery without angina pectoris: Secondary | ICD-10-CM | POA: Diagnosis present

## 2011-07-06 DIAGNOSIS — N183 Chronic kidney disease, stage 3 unspecified: Secondary | ICD-10-CM | POA: Diagnosis present

## 2011-07-06 DIAGNOSIS — N179 Acute kidney failure, unspecified: Secondary | ICD-10-CM | POA: Diagnosis present

## 2011-07-06 DIAGNOSIS — D131 Benign neoplasm of stomach: Principal | ICD-10-CM | POA: Diagnosis present

## 2011-07-06 DIAGNOSIS — K449 Diaphragmatic hernia without obstruction or gangrene: Secondary | ICD-10-CM | POA: Diagnosis present

## 2011-07-06 DIAGNOSIS — Z8673 Personal history of transient ischemic attack (TIA), and cerebral infarction without residual deficits: Secondary | ICD-10-CM

## 2011-07-06 DIAGNOSIS — Z950 Presence of cardiac pacemaker: Secondary | ICD-10-CM

## 2011-07-06 DIAGNOSIS — E876 Hypokalemia: Secondary | ICD-10-CM | POA: Diagnosis present

## 2011-07-06 DIAGNOSIS — D649 Anemia, unspecified: Secondary | ICD-10-CM

## 2011-07-06 DIAGNOSIS — R791 Abnormal coagulation profile: Secondary | ICD-10-CM | POA: Diagnosis present

## 2011-07-06 DIAGNOSIS — D62 Acute posthemorrhagic anemia: Secondary | ICD-10-CM | POA: Diagnosis present

## 2011-07-06 DIAGNOSIS — I129 Hypertensive chronic kidney disease with stage 1 through stage 4 chronic kidney disease, or unspecified chronic kidney disease: Secondary | ICD-10-CM | POA: Diagnosis present

## 2011-07-06 DIAGNOSIS — Z7901 Long term (current) use of anticoagulants: Secondary | ICD-10-CM

## 2011-07-06 DIAGNOSIS — I4891 Unspecified atrial fibrillation: Secondary | ICD-10-CM | POA: Diagnosis present

## 2011-07-06 LAB — CBC
HCT: 21.2 % — ABNORMAL LOW (ref 36.0–46.0)
Hemoglobin: 7 g/dL — ABNORMAL LOW (ref 12.0–15.0)
MCV: 91 fL (ref 78.0–100.0)
RBC: 2.33 MIL/uL — ABNORMAL LOW (ref 3.87–5.11)
WBC: 4.9 10*3/uL (ref 4.0–10.5)

## 2011-07-06 LAB — ABO/RH: ABO/RH(D): A POS

## 2011-07-06 LAB — URINALYSIS, ROUTINE W REFLEX MICROSCOPIC
Bilirubin Urine: NEGATIVE
Glucose, UA: NEGATIVE mg/dL
Nitrite: POSITIVE — AB
Specific Gravity, Urine: 1.014 (ref 1.005–1.030)
Specific Gravity, Urine: 1.01 (ref 1.005–1.030)
Urobilinogen, UA: 0.2 mg/dL (ref 0.0–1.0)
Urobilinogen, UA: 0.2 mg/dL (ref 0.0–1.0)
pH: 5.5 (ref 5.0–8.0)
pH: 6.5 (ref 5.0–8.0)

## 2011-07-06 LAB — DIFFERENTIAL
Basophils Absolute: 0 10*3/uL (ref 0.0–0.1)
Basophils Relative: 0 % (ref 0–1)
Eosinophils Absolute: 0.1 10*3/uL (ref 0.0–0.7)
Neutro Abs: 3.5 10*3/uL (ref 1.7–7.7)
Neutrophils Relative %: 72 % (ref 43–77)

## 2011-07-06 LAB — BASIC METABOLIC PANEL
BUN: 24 mg/dL — ABNORMAL HIGH (ref 6–23)
Chloride: 103 mEq/L (ref 96–112)
Creatinine, Ser: 2.15 mg/dL — ABNORMAL HIGH (ref 0.50–1.10)
Glucose, Bld: 91 mg/dL (ref 70–99)
Potassium: 3.7 mEq/L (ref 3.5–5.1)

## 2011-07-06 LAB — CK TOTAL AND CKMB (NOT AT ARMC)
CK, MB: 1.9 ng/mL (ref 0.3–4.0)
Relative Index: INVALID (ref 0.0–2.5)

## 2011-07-06 LAB — HEPATIC FUNCTION PANEL
ALT: 19 U/L (ref 0–35)
AST: 39 U/L — ABNORMAL HIGH (ref 0–37)
Albumin: 3.3 g/dL — ABNORMAL LOW (ref 3.5–5.2)
Bilirubin, Direct: <0.1 mg/dL (ref 0.0–0.3)
Total Bilirubin: 0.3 mg/dL (ref 0.3–1.2)

## 2011-07-06 LAB — URINE MICROSCOPIC-ADD ON

## 2011-07-06 LAB — TSH: TSH: 1.746 u[IU]/mL (ref 0.350–4.500)

## 2011-07-06 LAB — MRSA PCR SCREENING: MRSA by PCR: NEGATIVE

## 2011-07-06 LAB — APTT: aPTT: 53 seconds — ABNORMAL HIGH (ref 24–37)

## 2011-07-07 LAB — PROTIME-INR
INR: 2.6 — ABNORMAL HIGH (ref 0.00–1.49)
Prothrombin Time: 28.3 seconds — ABNORMAL HIGH (ref 11.6–15.2)

## 2011-07-07 LAB — TYPE AND SCREEN
ABO/RH(D): A POS
Antibody Screen: NEGATIVE
Unit division: 0

## 2011-07-07 LAB — BASIC METABOLIC PANEL
BUN: 23 mg/dL (ref 6–23)
CO2: 31 mEq/L (ref 19–32)
Calcium: 8.7 mg/dL (ref 8.4–10.5)
Chloride: 102 mEq/L (ref 96–112)
Creatinine, Ser: 1.82 mg/dL — ABNORMAL HIGH (ref 0.50–1.10)

## 2011-07-07 LAB — CBC
Hemoglobin: 9.1 g/dL — ABNORMAL LOW (ref 12.0–15.0)
MCH: 29.4 pg (ref 26.0–34.0)
MCHC: 33.7 g/dL (ref 30.0–36.0)
Platelets: 198 10*3/uL (ref 150–400)
RBC: 3.27 MIL/uL — ABNORMAL LOW (ref 3.87–5.11)
RDW: 14.3 % (ref 11.5–15.5)
RDW: 14.1 % (ref 11.5–15.5)
WBC: 4.9 10*3/uL (ref 4.0–10.5)

## 2011-07-07 LAB — PREPARE FRESH FROZEN PLASMA

## 2011-07-08 LAB — COMPREHENSIVE METABOLIC PANEL
ALT: 16 U/L (ref 0–35)
AST: 30 U/L (ref 0–37)
Albumin: 2.9 g/dL — ABNORMAL LOW (ref 3.5–5.2)
Alkaline Phosphatase: 69 U/L (ref 39–117)
Chloride: 103 mEq/L (ref 96–112)
Creatinine, Ser: 1.59 mg/dL — ABNORMAL HIGH (ref 0.50–1.10)
Potassium: 4.2 mEq/L (ref 3.5–5.1)
Sodium: 139 mEq/L (ref 135–145)
Total Bilirubin: 0.3 mg/dL (ref 0.3–1.2)

## 2011-07-08 LAB — CBC
HCT: 27.4 % — ABNORMAL LOW (ref 36.0–46.0)
Hemoglobin: 9.1 g/dL — ABNORMAL LOW (ref 12.0–15.0)
RBC: 3.12 MIL/uL — ABNORMAL LOW (ref 3.87–5.11)
WBC: 5.1 10*3/uL (ref 4.0–10.5)

## 2011-07-08 LAB — URINE CULTURE: Colony Count: >=100000

## 2011-07-08 LAB — PROTIME-INR
INR: 3.03 — ABNORMAL HIGH (ref 0.00–1.49)
Prothrombin Time: 31.9 seconds — ABNORMAL HIGH (ref 11.6–15.2)

## 2011-07-09 LAB — BASIC METABOLIC PANEL
CO2: 30 mEq/L (ref 19–32)
Calcium: 8.8 mg/dL (ref 8.4–10.5)
Creatinine, Ser: 1.39 mg/dL — ABNORMAL HIGH (ref 0.50–1.10)
GFR calc non Af Amer: 38 mL/min — ABNORMAL LOW (ref >60)

## 2011-07-09 LAB — CBC
MCH: 28.8 pg (ref 26.0–34.0)
MCHC: 32.8 g/dL (ref 30.0–36.0)
MCV: 88 fL (ref 78.0–100.0)
Platelets: 185 10*3/uL (ref 150–400)
RBC: 3.33 MIL/uL — ABNORMAL LOW (ref 3.87–5.11)

## 2011-07-09 LAB — PROTIME-INR
INR: 2.37 — ABNORMAL HIGH (ref 0.00–1.49)
Prothrombin Time: 26.3 seconds — ABNORMAL HIGH (ref 11.6–15.2)

## 2011-07-10 ENCOUNTER — Other Ambulatory Visit: Payer: Self-pay | Admitting: Gastroenterology

## 2011-07-10 DIAGNOSIS — D131 Benign neoplasm of stomach: Secondary | ICD-10-CM

## 2011-07-10 DIAGNOSIS — K922 Gastrointestinal hemorrhage, unspecified: Secondary | ICD-10-CM

## 2011-07-10 DIAGNOSIS — D649 Anemia, unspecified: Secondary | ICD-10-CM

## 2011-07-10 LAB — BASIC METABOLIC PANEL
BUN: 12 mg/dL (ref 6–23)
CO2: 31 mEq/L (ref 19–32)
Chloride: 107 mEq/L (ref 96–112)
Creatinine, Ser: 1.35 mg/dL — ABNORMAL HIGH (ref 0.50–1.10)

## 2011-07-10 LAB — CBC
HCT: 28.3 % — ABNORMAL LOW (ref 36.0–46.0)
Hemoglobin: 9.2 g/dL — ABNORMAL LOW (ref 12.0–15.0)
MCH: 28.9 pg (ref 26.0–34.0)
MCHC: 32.5 g/dL (ref 30.0–36.0)

## 2011-07-11 LAB — BASIC METABOLIC PANEL
CO2: 29 mEq/L (ref 19–32)
Chloride: 108 mEq/L (ref 96–112)
Potassium: 3.5 mEq/L (ref 3.5–5.1)
Sodium: 143 mEq/L (ref 135–145)

## 2011-07-11 LAB — HEMOGLOBIN AND HEMATOCRIT, BLOOD: HCT: 28 % — ABNORMAL LOW (ref 36.0–46.0)

## 2011-07-26 ENCOUNTER — Encounter: Payer: Self-pay | Admitting: Gastroenterology

## 2011-07-26 ENCOUNTER — Ambulatory Visit (INDEPENDENT_AMBULATORY_CARE_PROVIDER_SITE_OTHER): Payer: Medicare Other | Admitting: Gastroenterology

## 2011-07-26 VITALS — BP 124/62 | HR 60 | Ht 63.0 in | Wt 144.0 lb

## 2011-07-26 DIAGNOSIS — R110 Nausea: Secondary | ICD-10-CM

## 2011-07-26 DIAGNOSIS — D131 Benign neoplasm of stomach: Secondary | ICD-10-CM

## 2011-07-26 DIAGNOSIS — Z860100 Personal history of colon polyps, unspecified: Secondary | ICD-10-CM

## 2011-07-26 DIAGNOSIS — K219 Gastro-esophageal reflux disease without esophagitis: Secondary | ICD-10-CM

## 2011-07-26 DIAGNOSIS — D649 Anemia, unspecified: Secondary | ICD-10-CM

## 2011-07-26 NOTE — Progress Notes (Signed)
History of Present Illness: This is a 70 year old female who returns for follow up with her husband following a hospitalization 3 weeks ago for GI bleed secondary to bleeding hyperplastic gastric polyp that was removed by Dr. Audley Hose. She had a coagulopathy with an INR of 5, a urinary tract infection, acute on chronic renal failure and anemia. I have reviewed the discharge summary. She states she has been weak and has had constant nausea since leaving the hospital. She did not have nausea prior to admission. Denies weight loss, abdominal pain, constipation, diarrhea, change in stool caliber, melena, hematochezia, vomiting, dysphagia, reflux symptoms, chest pain.  Current Medications, Allergies, Past Medical History, Past Surgical History, Family History and Social History were reviewed in [employer] record.  Physical Exam: General: Well developed , well nourished, fatigued, no acute distress Head: Normocephalic and atraumatic Eyes:  sclerae anicteric, EOMI Ears: Normal auditory acuity Mouth: No deformity or lesions Lungs: Clear throughout to auscultation Heart: Regular rate and rhythm; no murmurs, rubs or bruits Abdomen: Soft, non tender and non distended. No masses, hepatosplenomegaly or hernias noted. Normal Bowel sounds Musculoskeletal: Symmetrical with no gross deformities  Pulses:  Normal pulses noted Extremities: No clubbing, cyanosis, edema or deformities noted Neurological: Alert oriented x 4, grossly nonfocal Psychological:  Alert and cooperative. Fatigued with a flat affect  Assessment and Recommendations:  1. Gastrointestinal bleed and anemia secondary to a bleeding hyperplastic gastric polyp. This was removed. No further therapy or followup needed.  2. GERD. Continue pantoprazole 40 mg daily and antireflux measures.  3. Nausea. I suspect this is a medication side effect possibly from Celexa, Keppra, amiodarone or another medication. Asked her to review these  medications with her prescribing physicians.  4. Personal history of adenomatous colon polyps. Surveillance colonoscopy recommended January 2017.  5. Mechanical aortic and mitral valve replacements on Coumadin anticoagulation.

## 2011-07-26 NOTE — Patient Instructions (Addendum)
Concerned for side effects that cause nausea should be followed up with your Primary Care Physician.  cc: Quintin Alto, MD       Caprice Kluver, MD

## 2011-08-07 NOTE — Discharge Summary (Signed)
NAMESARAHY, Alice Ballard                   ACCOUNT NO.:  [id number]  MEDICAL RECORD NO.:  [id number]  LOCATION:  3712                         FACILITY:  [facility]  PHYSICIAN:  Alice Ballard, M.D. DATE OF BIRTH:  May 09, 1941  DATE OF ADMISSION:  07/06/2011 DATE OF DISCHARGE:  07/11/2011                              DISCHARGE SUMMARY   DISCHARGE DIAGNOSES: 1. Symptomatic acute blood loss anemia with fatigue, weakness, and     shortness of breath secondary to gastrointestinal bleed. 2. Gastrointestinal bleed secondary to bleeding gastric polyp     resected, awaiting pathology. 3. Coagulopathy with INR 5 on admission, treated with fresh frozen     plasma and Coumadin held until INR therapeutic only. 4. Urinary tract infection, Klebsiella pneumonia infection treated     with IV Rocephin and now Keflex. 5. Acute renal failure, on chronic with a chronic kidney disease stage     III, much improved from admission. 6. History of sick sinus syndrome with permanent transvenous pacemaker     AV pacing. 7. History of mechanical mitral valve and aortic valve replacements,     on Coumadin. 8. History of paroxysmal atrial fibrillation, maintaining sinus rhythm     on amiodarone. 9. Hypertension, controlled. 10.History of seizure disorder. 11.Hypokalemia, replaced.  DISCHARGE CONDITION:  Improved.  PROCEDURES:  EGD by Dr. Jeani Ballard on July 09, 2011, he was on- call for Dr. Russella Ballard.  DISCHARGE INSTRUCTIONS: 1. Increase activity slowly. 2. Low-sodium, heart-healthy diet. 3. Only 1 mg of Coumadin today prior to discharge, on July 11, 2011. 4. Follow up with Dr. Julieanne Ballard on August 05, 2011, at 4 p.m. 5. Follow up with Dr. Claudette Ballard with [facility]. 6. Follow up with Coumadin Clinic at [facility] on July 13, 2011, at 10:10 a.m. 7. The patient's husband had issues and we recommended he call GI as     well.  DISCHARGE MEDICATIONS:  See medication reconciliation  sheet.  We did decrease her lisinopril and was held during hospitalization secondary to acute renal failure.  We will resume it at half the home dose which will be just 20 mg daily on July 12, 2011.  We have adjusted her Coumadin dose and she is on Keflex currently for 2 more days to complete her UTI treatment.  HOSPITAL COURSE:  A 70 year old white female with history of mitral valve replacement in 1986, with a St. Jude mechanical valve secondary to rheumatic fever and then an aortic valve replacement in 1994 secondary to endocarditis with a St. Jude mechanical valve.  Cardiac cath revealed nonobstructive disease.  The patient is on Coumadin and had came in to our office for INR evaluation.  She felt very weak and tired and some shortness of breath.  We also did a hemoglobin and hematocrit, her hemoglobin was found to be 7.3 with hematocrit 23.5.  Her INR was 5. She was sent to [facility].  Additionally, on that lab draw, her creatinine was 2.20.  She was admitted, unable to get a Hemoccult stool at that time and there was no stool in the rectal vault.  GI consult was  obtained with her GI physician, Dr. Claudette Ballard, at [facility].  We gave her 1 unit of fresh frozen plasma.  Secondary to her elevated INR, we also gave 2 units of packed cells.  She was admitted and has been monitored.  Her INR did come down.  On July 07, 2011, her INR was 2.60 and her hemoglobin had come up to 9.5, hematocrit 28.7, she is stable with that.  UA was done as well and her urine Klebsiella and she is treated with IV Rocephin initially and now switched to p.o. Keflex.  The patient underwent EGD and she had a gastric polyp that was bleeding. This was resected without further complications.  She also had hemoclipping.  She does also have a hiatal hernia and that was done by Dr. Elnoria Ballard who was on-call for Dr. Russella Ballard over the holiday weekend.  The patient continued to be stable and ambulated  without complications.  By July 11, 2011, she was ready for discharge home.  She will follow up with Dr. Clarene Ballard with the Coumadin Clinic and will call Dr. Ardell Ballard office to see if they need to see her back.  LABORATORY DATA:  On admission to the [facility], hemoglobin 7, hematocrit 21.2, WBC 4.9, platelets 193.  ProTime 42.8, INR 4.42, PTT 53.  Chemistry; sodium was 139, potassium 3.7, chloride 103, CO2 30, glucose 91, BUN 24, creatinine 2.15, total bili 0.3, direct bili less than 0.1, alkaline phos 76, SGOT 39, SGPT 19, total protein 6.3, albumin 3.3, calcium 9.9, magnesium 2.2.  Cardiac enzymes were negative.  BNP was 822.  TSH was 1.746.  Urinalysis; large amount of blood, positive leukocytes and nitrites, and urine culture with Klebsiella.  At discharge, ProTime  is 32.2, INR of 3.07.  She received 1 mg of Coumadin today, July 11, 2011, and then she will resume just 2.5 mg daily and will be seen in Coumadin Clinic on Friday, 2 days from now.  Hemoglobin at discharge is 9.3 with hematocrit of 28.  RADIOLOGY:  Chest x-ray on admission, stable chronic interstitial prominence without overt edema and renal ultrasound was done due to some flank pain she had in addition to the UTI.  No hydronephrosis or ultrasound evidence of urinary tract calculi, but this is low sensitivity for renal calculi, no explanation for hematuria.  If symptoms continue, would evaluate with CT.  EKG reveals AV pacing.  She has a large atrial paced first-degree AV block.     Alice Ballard, N.P.   ______________________________ Alice Solo Alice Ballard, M.D.    LRI/MEDQ  D:  07/11/2011  T:  07/11/2011  Job:  [id number]  cc:   Alice Lick. Alice Dar, MD, Alice Graham Donnie Aho, MD  Electronically Signed by Alice Ballard N.P. on 07/13/2011 11:06:23 AM Electronically Signed by Alice Ballard M.D. on 08/07/2011 11:24:41 AM

## 2011-08-07 NOTE — H&P (Signed)
NAMESABEL, HORNBECK                   ACCOUNT NO.:  [id number]  MEDICAL RECORD NO.:  [id number]  LOCATION:  [facility]                         FACILITY:  [facility]  PHYSICIAN:  Thereasa Solo. Little, M.D. DATE OF BIRTH:  1941/03/27  DATE OF ADMISSION:  07/06/2011 DATE OF DISCHARGE:                             HISTORY & PHYSICAL   CHIEF COMPLAINT:  Weakness and shortness of breath.  HISTORY OF PRESENT ILLNESS:  A 70 year old white female with history of mitral valve replacement in 1986 and aortic valve replacement in 1994, both St. Jude mechanical valves. Originally the mitral valve was replaced secondary to rheumatic fever and the aortic valve was done later secondary to endocarditis.  She also has a permanent transvenous pacemaker secondary to sick sinus syndrome with paroxysmal Afib.  Her last cardiac cath was in July 2011 with nonobstructive coronary disease.  Slightly elevated PA pressures of 42, EF was 55% to 60%, her last cardioversion was in August 2011.  The patient presented to our Coumadin Clinic at [facility] today, July 06, 2011, with complaints of weakness and shortness of breath.  INR was 5.3, but she was very pale.  She was sent for hemoglobin and basic metabolic panel, hemoglobin was 7.3, hematocrit 23.5, and creatinine 2.20.  Previously, her creatinine has been 1.1. She has stated she had a black stool earlier this week, but her most recent stool was brown.  She was sent to [facility], we are admitting her for acute blood loss anemia.  We will hold her Coumadin, transfuse fresh frozen plasma.  No Coumadin for now secondary to the bleed and we hope not to have to give vitamin K.  We will transfuse 2 units of packed cells.  PAST MEDICAL HISTORY:  Mitral valve replacement in 1986 secondary to rheumatic fever with St. Jude mechanical, aortic valve replacement in 1994 secondary to endocarditis St. Jude mechanical, cardiac cath with nonobstructive  disease.  EF by echo 55% to 60%, permanent transvenous pacemaker with Medtronic Adapta secondary to sick sinus syndrome.  She has a history of PAF on amiodarone and Coumadin as well, last cardioversion was in August 2011.  She also has history of right L5-S1 herniated nuclear propulsus with surgery, history of CVA, and history of seizure disorder.  SOCIAL HISTORY:  Married and lives with her husband.  She has one adult child.  She walks on a fairly regular basis.  No tobacco.  No alcohol.  FAMILY HISTORY:  Not significant to this admission.  REVIEW OF SYSTEMS:  GENERAL:  Increased weakness recently.  No colds or fevers. SKIN:  No rashes or ulcers. HEENT:  Negative. GI:  Black stool earlier this week, now normal stool. GU:  No hematuria or dysuria. NEURO:  No lightheadedness or dizziness. CARDIOVASCULAR:  Denies chest pain, but positive shortness of breath. MUSCULOSKELETAL:  Negative. ENDOCRINE:  No diabetes or thyroid disease.  OUTPATIENT MEDICATIONS: 1. Coumadin.  She was taking 2.5 mg every day and 5 mg on Wednesdays.     We are holding that now. 2. Lipitor 40 mg daily. 3. Vitamin C 1000 mg daily. 4. Vitamin D 2000 units daily. 5. Prilosec 20  mg p.r.n. 6. Lisinopril 20 mg b.i.d. 7. Amiodarone 200 mg 1-1/2 tablets daily to equal 300 mg. 8. Keppra 500 mg b.i.d. 9. HCTZ 25 mg daily. 10.Claritin 10 mg p.r.n. 11.Celexa was just started on June 22, 2011 at 20 mg daily.  ALLERGIES:  No known allergies.  PHYSICAL EXAMINATION:  VITAL SIGNS:  Blood pressure 139/59, pulse 73, respirations 20, temperature 98.3, and oxygen saturation on room air is 100%. GENERAL:  Alert, oriented, pale, and pleasant affect.  No acute distress. SKIN:  Warm and dry.  Brisk capillary refill. HEENT: Normocephalic.  Sclerae are clear. NECK:  Supple.  No JVD.  No bruits. HEART:  S1, S2.  Regular rate and rhythm with the click of valve and soft systolic murmur. LUNGS:  Clear without rales, rhonchi,  or wheezes. ABDOMEN:  Soft and nontender.  Positive bowel sounds.  Do not palpate liver, spleen, or masses. LOWER EXTREMITIES:  Without edema, 1+ posterior tib bilaterally. NEURO:  Alert and oriented x3.  Moves all extremities.  Follows commands.  LABORATORY DATA:  Hemoglobin 7.3, hematocrit 23.5, platelets 218, and WBC 4.4.  INR was 5.3.  Sodium 141, potassium 3.9, BUN 21, creatinine 2.20, and glucose 102.  Please note EKG appears to be either atrially paced with a large first- degree block versus AFib with non-sensing and non-pacing.  IMPRESSION: 1. Acute blood loss anemia secondary to gastrointestinal bleed. 2. Acute renal failure. 3. Gastrointestinal bleed.  Mechanical aortic valve and mitral valve     on Coumadin. 4. Elevated supratherapeutic INR. 5. Paroxysmal atrial fibrillation. 6. Permanent transvenous pacemaker secondary to sick sinus syndrome,     Medtronic. 7. Hypertension, stable. 8. Questionable pacemaker malfunction.  We will have it interrogated.  PLAN:  Admit to step-down.  Transfuse fresh frozen plasma and lower the INR and then transfuse 2 units of packed cells.  Prefer not to usevitamin K secondary to her valve.  We will check her chest x-ray, GI consult with [facility] as she has seen in the past.     [name], N.P.   ______________________________ Thereasa Solo Little, M.D.    LRI/MEDQ  D:  07/06/2011  T:  07/06/2011  Job:  [id number]  cc:   Thereasa Solo. Little, M.D. [facility]olyne Loa [facility]  Electronically Signed by Nada Boozer N.P. on 07/11/2011 07:52:11 AM Electronically Signed by Julieanne Manson M.D. on 08/07/2011 11:24:56 AM

## 2011-09-19 ENCOUNTER — Encounter (HOSPITAL_COMMUNITY): Payer: Self-pay | Admitting: *Deleted

## 2011-09-19 ENCOUNTER — Telehealth: Payer: Self-pay | Admitting: Gastroenterology

## 2011-09-19 ENCOUNTER — Emergency Department (HOSPITAL_COMMUNITY)
Admission: EM | Admit: 2011-09-19 | Discharge: 2011-09-19 | Disposition: A | Discharge status: Home / Self Care | Payer: Medicare Other | Attending: Emergency Medicine | Admitting: Emergency Medicine

## 2011-09-19 ENCOUNTER — Emergency Department (HOSPITAL_COMMUNITY): Payer: Medicare Other

## 2011-09-19 ENCOUNTER — Other Ambulatory Visit: Payer: Self-pay

## 2011-09-19 DIAGNOSIS — K219 Gastro-esophageal reflux disease without esophagitis: Secondary | ICD-10-CM | POA: Insufficient documentation

## 2011-09-19 DIAGNOSIS — D689 Coagulation defect, unspecified: Secondary | ICD-10-CM | POA: Insufficient documentation

## 2011-09-19 DIAGNOSIS — M546 Pain in thoracic spine: Secondary | ICD-10-CM | POA: Insufficient documentation

## 2011-09-19 DIAGNOSIS — Z8673 Personal history of transient ischemic attack (TIA), and cerebral infarction without residual deficits: Secondary | ICD-10-CM | POA: Insufficient documentation

## 2011-09-19 DIAGNOSIS — M549 Dorsalgia, unspecified: Secondary | ICD-10-CM

## 2011-09-19 DIAGNOSIS — E785 Hyperlipidemia, unspecified: Secondary | ICD-10-CM | POA: Insufficient documentation

## 2011-09-19 DIAGNOSIS — R079 Chest pain, unspecified: Secondary | ICD-10-CM | POA: Insufficient documentation

## 2011-09-19 DIAGNOSIS — I2510 Atherosclerotic heart disease of native coronary artery without angina pectoris: Secondary | ICD-10-CM | POA: Insufficient documentation

## 2011-09-19 DIAGNOSIS — Z79899 Other long term (current) drug therapy: Secondary | ICD-10-CM | POA: Insufficient documentation

## 2011-09-19 DIAGNOSIS — R011 Cardiac murmur, unspecified: Secondary | ICD-10-CM | POA: Insufficient documentation

## 2011-09-19 DIAGNOSIS — Z7901 Long term (current) use of anticoagulants: Secondary | ICD-10-CM | POA: Insufficient documentation

## 2011-09-19 DIAGNOSIS — I10 Essential (primary) hypertension: Secondary | ICD-10-CM | POA: Insufficient documentation

## 2011-09-19 DIAGNOSIS — D649 Anemia, unspecified: Secondary | ICD-10-CM | POA: Insufficient documentation

## 2011-09-19 DIAGNOSIS — G40909 Epilepsy, unspecified, not intractable, without status epilepticus: Secondary | ICD-10-CM | POA: Insufficient documentation

## 2011-09-19 DIAGNOSIS — K921 Melena: Secondary | ICD-10-CM | POA: Insufficient documentation

## 2011-09-19 DIAGNOSIS — I4891 Unspecified atrial fibrillation: Secondary | ICD-10-CM | POA: Insufficient documentation

## 2011-09-19 LAB — URINE MICROSCOPIC-ADD ON

## 2011-09-19 LAB — URINALYSIS, ROUTINE W REFLEX MICROSCOPIC
Bilirubin Urine: NEGATIVE
Glucose, UA: NEGATIVE mg/dL
Protein, ur: 30 mg/dL — AB

## 2011-09-19 LAB — CBC
MCV: 88.4 fL (ref 78.0–100.0)
Platelets: 157 10*3/uL (ref 150–400)
RBC: 3.03 MIL/uL — ABNORMAL LOW (ref 3.87–5.11)
WBC: 4.4 10*3/uL (ref 4.0–10.5)

## 2011-09-19 LAB — DIFFERENTIAL
Lymphocytes Relative: 24 % (ref 12–46)
Lymphs Abs: 1.1 10*3/uL (ref 0.7–4.0)
Neutrophils Relative %: 66 % (ref 43–77)

## 2011-09-19 LAB — BASIC METABOLIC PANEL
CO2: 28 mEq/L (ref 19–32)
Glucose, Bld: 79 mg/dL (ref 70–99)
Potassium: 3.5 mEq/L (ref 3.5–5.1)
Sodium: 139 mEq/L (ref 135–145)

## 2011-09-19 LAB — PROTIME-INR: Prothrombin Time: 36.6 seconds — ABNORMAL HIGH (ref 11.6–15.2)

## 2011-09-19 LAB — TYPE AND SCREEN: Antibody Screen: NEGATIVE

## 2011-09-19 MED ORDER — MORPHINE SULFATE 2 MG/ML IJ SOLN
INTRAMUSCULAR | Status: AC
  Administered 2011-09-19: 2 mg via INTRAVENOUS
  Filled 2011-09-19: qty 1

## 2011-09-19 MED ORDER — SODIUM CHLORIDE 0.9 % IV SOLN
Freq: Once | INTRAVENOUS | Status: AC
  Administered 2011-09-19: 16:00:00 via INTRAVENOUS

## 2011-09-19 MED ORDER — IOHEXOL 300 MG/ML  SOLN
100.0000 mL | Freq: Once | INTRAMUSCULAR | Status: AC | PRN
  Administered 2011-09-19: 100 mL via INTRAVENOUS

## 2011-09-19 NOTE — ED Notes (Signed)
Pt transported to radiology.

## 2011-09-19 NOTE — ED Provider Notes (Signed)
History     CSN: [id number] Arrival date & time: 09/19/2011 12:27 PM   First MD Initiated Contact with Patient 09/19/11 1608      Chief Complaint  Patient presents with  . Back Pain    (Consider location/radiation/quality/duration/timing/severity/associated sxs/prior treatment) HPI  Past Medical History  Diagnosis Date  . UTI (urinary tract infection)   . Sick sinus syndrome   . Paroxysmal atrial fibrillation   . Hypertension   . Seizure disorder   . Coronary artery disease   . Hyperlipidemia   . Cerebral vascular disease   . GERD (gastroesophageal reflux disease)   . Tubular adenoma of colon 11/2010  . Hemorrhoids   . Hiatal hernia   . Fatty liver   . Spinal stenosis   . Gastric polyp     Past Surgical History  Procedure Date  . Pacemaker placement 05/2010  . Aortic and mitral valve replacement 1994    St. Jude  . Mitral valve replacement 1986  . Cardiac catheterization 04/2010    Family History  Problem Relation Age of Onset  . Colon cancer Sister   . Colon polyps Sister     History  Substance Use Topics  . Smoking status: Never Smoker   . Smokeless tobacco: Never Used  . Alcohol Use: No    OB History    Grav Para Term Preterm Abortions TAB SAB Ect Mult Living                  Review of Systems  Allergies  Carbamazepine  Home Medications   Current Outpatient Rx  Name Route Sig Dispense Refill  . AMIODARONE HCL 200 MG PO TABS Oral Take 300 mg by mouth daily. Take one and 1/2 tablets by mouth once daily    . VITAMIN C 1000 MG PO TABS Oral Take 1,000 mg by mouth daily.     . ATORVASTATIN CALCIUM 80 MG PO TABS Oral Take 40 mg by mouth at bedtime.     . CHOLECALCIFEROL 2000 UNITS PO CAPS Oral Take 1 capsule by mouth daily.     [name] HYPROMELLOSE 2.5 % OP SOLN Both Eyes Place 2 drops into both eyes daily as needed. For dryness    . LEVETIRACETAM 500 MG PO TABS Oral Take 500 mg by mouth 2 (two) times daily.     [name] LISINOPRIL 20 MG PO TABS Oral Take 20  mg by mouth daily.      [name] PANTOPRAZOLE SODIUM 40 MG PO TBEC Oral Take 40 mg by mouth daily.      . WARFARIN SODIUM 5 MG PO TABS Oral Take 2.5 mg by mouth daily. Take as directed    . ACETAMINOPHEN 500 MG PO TABS Oral Take 1,000 mg by mouth every 6 (six) hours as needed. For pain      BP 170/76  Pulse 70  Temp(Src) 97 F (36.1 C) (Oral)  Resp 18  SpO2 100%  Physical Exam  ED Course  Procedures (including critical care time)  Labs Reviewed  URINALYSIS, ROUTINE W REFLEX MICROSCOPIC - Abnormal; Notable for the following:    Appearance CLOUDY (*)    Hgb urine dipstick LARGE (*)    Protein, ur 30 (*)    Leukocytes, UA TRACE (*)    All other components within normal limits  PROTIME-INR - Abnormal; Notable for the following:    Prothrombin Time 36.6 (*)    INR 3.62 (*)    All other components within normal limits  APTT -  Abnormal; Notable for the following:    aPTT 47 (*)    All other components within normal limits  CBC - Abnormal; Notable for the following:    RBC 3.03 (*)    Hemoglobin 8.8 (*)    HCT 26.8 (*)    All other components within normal limits  BASIC METABOLIC PANEL - Abnormal; Notable for the following:    Creatinine, Ser 1.28 (*)    GFR calc non Af Amer 42 (*)    GFR calc Af Amer 49 (*)    All other components within normal limits  URINE MICROSCOPIC-ADD ON - Abnormal; Notable for the following:    Squamous Epithelial / LPF FEW (*)    Bacteria, UA MANY (*)    All other components within normal limits  DIFFERENTIAL  TYPE AND SCREEN  POCT I-STAT TROPONIN I  POCT OCCULT BLOOD STOOL, DEVICE  I-STAT TROPONIN I   No results found.   No diagnosis found.    MDM  Duplicate entry. Please delete        Doug Sou, MD 09/20/11 [phone]

## 2011-09-19 NOTE — ED Notes (Signed)
Pt reports being sent here for possible internal bleeding, having pain to mid and upper back x 7 days, has blood in urine and stools. No acute distress noted at triage.

## 2011-09-19 NOTE — Telephone Encounter (Signed)
Patient's husband advised to to follow up and notify Dr Leandrew Koyanagi, I will send a copy of this note also

## 2011-09-19 NOTE — ED Provider Notes (Signed)
Complains of back pain at left scapular area onset 5 days ago not made better or worse by anything. Presently pain-free. Reports had black bowel movement 4 days ago. However, last bowel movement 2 days ago was normal. On exam lungs clear to auscultation heart regular rate and rhythm with a midsystolic click abdomen nondistended nontender  Doug Sou, MD 09/19/11 1836

## 2011-09-19 NOTE — Telephone Encounter (Signed)
Should have evaluation with PCP to check INR, CBC, hemoccults, etc.

## 2011-09-19 NOTE — ED Notes (Signed)
Pt states that she has had back pain and that is what happened whhen she had a gi bleed the last time states had a black bm on sat and when she went to pee it was red pt states occ she willl have left chest pain by her pacemekae

## 2011-09-19 NOTE — Telephone Encounter (Signed)
Patient's husband reports that she had a black stool on Sunday.  She has had a BM yesterday and today that was brown.  The patient denies any iron, peptobismol, kaopectate etc. Use.  She denies any abdominal pain.  Her husband reports that she had a large amount of blood in her urine today and has pain in her back.  They have notified her primary care and are awaiting a return phone call.  She  has a coumadin check with Dr Clarene Duke tomorrow.  Dr Russella Dar she has no other GI complaints.  Please advise

## 2011-09-19 NOTE — ED Provider Notes (Signed)
History     CSN: [id number] Arrival date & time: 09/19/2011 12:27 PM   First MD Initiated Contact with Patient 09/19/11 1608      Chief Complaint  Patient presents with  . Back Pain    (Consider location/radiation/quality/duration/timing/severity/associated sxs/prior treatment) Patient is a 70 y.o. female presenting with back pain. The history is provided by the patient and the spouse.  Back Pain  This is a new problem. The current episode started more than 1 week ago. The problem occurs constantly. The problem has not changed since onset.The pain is associated with no known injury (The patient reports she has a recent history of admission for upper GI bleeding with similar symptoms. No nausea or vomiting. She has had melena x 1. No lightheadedness, dizziness or syncope. No SOB or ). The pain is present in the thoracic spine. The quality of the pain is described as aching. The pain is the same all the time. Associated symptoms include chest pain. Pertinent negatives include no fever, no abdominal pain and no dysuria.    Past Medical History  Diagnosis Date  . UTI (urinary tract infection)   . Sick sinus syndrome   . Paroxysmal atrial fibrillation   . Hypertension   . Seizure disorder   . Coronary artery disease   . Hyperlipidemia   . Cerebral vascular disease   . GERD (gastroesophageal reflux disease)   . Tubular adenoma of colon 11/2010  . Hemorrhoids   . Hiatal hernia   . Fatty liver   . Spinal stenosis   . Gastric polyp     Past Surgical History  Procedure Date  . Pacemaker placement 05/2010  . Aortic and mitral valve replacement 1994    St. Jude  . Mitral valve replacement 1986  . Cardiac catheterization 04/2010    Family History  Problem Relation Age of Onset  . Colon cancer Sister   . Colon polyps Sister     History  Substance Use Topics  . Smoking status: Never Smoker   . Smokeless tobacco: Never Used  . Alcohol Use: No    OB History    Grav Para Term  Preterm Abortions TAB SAB Ect Mult Living                  Review of Systems  Constitutional: Negative for fever and chills.  HENT: Negative.   Respiratory: Negative.  Negative for shortness of breath.   Cardiovascular: Positive for chest pain.  Gastrointestinal: Positive for blood in stool. Negative for abdominal pain.  Genitourinary: Negative for dysuria and hematuria.  Musculoskeletal: Positive for back pain.  Skin: Negative.  Negative for pallor.  Neurological: Negative.  Negative for dizziness, syncope and light-headedness.    Allergies  Carbamazepine  Home Medications   Current Outpatient Rx  Name Route Sig Dispense Refill  . AMIODARONE HCL 200 MG PO TABS Oral Take 300 mg by mouth daily. Take one and 1/2 tablets by mouth once daily    . VITAMIN C 1000 MG PO TABS Oral Take 1,000 mg by mouth daily.     . ATORVASTATIN CALCIUM 80 MG PO TABS Oral Take 40 mg by mouth at bedtime.     . CHOLECALCIFEROL 2000 UNITS PO CAPS Oral Take 1 capsule by mouth daily.     [name] HYPROMELLOSE 2.5 % OP SOLN Both Eyes Place 2 drops into both eyes daily as needed. For dryness    . LEVETIRACETAM 500 MG PO TABS Oral Take 500 mg by mouth 2 (  two) times daily.     [name] LISINOPRIL 20 MG PO TABS Oral Take 20 mg by mouth daily.      [name] PANTOPRAZOLE SODIUM 40 MG PO TBEC Oral Take 40 mg by mouth daily.      . WARFARIN SODIUM 5 MG PO TABS Oral Take 2.5 mg by mouth daily. Take as directed    . ACETAMINOPHEN 500 MG PO TABS Oral Take 1,000 mg by mouth every 6 (six) hours as needed. For pain      BP 163/90  Pulse 73  Temp(Src) 97.8 F (36.6 C) (Oral)  Resp 16  SpO2 99%  Physical Exam  Constitutional: She appears well-developed and well-nourished.  HENT:  Head: Normocephalic.  Eyes: Conjunctivae are normal.       No conjunctival palor.  Neck: Normal range of motion. Neck supple.  Cardiovascular: Normal rate and regular rhythm.   Murmur heard. Pulmonary/Chest: Effort normal and breath sounds normal.    Abdominal: Soft. Bowel sounds are normal. She exhibits no distension. There is no tenderness. There is no rebound and no guarding.  Musculoskeletal: Normal range of motion.  Neurological: She is alert. No cranial nerve deficit.  Skin: Skin is warm and dry. No rash noted. No pallor.  Psychiatric: She has a normal mood and affect.    ED Course  Procedures (including critical care time)   Labs Reviewed  URINALYSIS, ROUTINE W REFLEX MICROSCOPIC  PROTIME-INR  APTT  CBC  DIFFERENTIAL  BASIC METABOLIC PANEL  POCT OCCULT BLOOD STOOL, DEVICE   No results found.   No diagnosis found.    MDM  She feels better with medications. Discussed lab studies and CT chest as being essentially without significant change. Discussed hematuria and she reports history of same. Patient and husband feel comfortable with discharge home with follow up at primary care office for recheck of INR and urine in 2-3 days.         Rodena Medin, PA 09/19/11 2135

## 2011-09-20 NOTE — ED Provider Notes (Signed)
Medical screening examination/treatment/procedure(s) were conducted as a shared visit with non-physician practitioner(s) and myself.  I personally evaluated the patient during the encounter  C/o pain at right scapula for several days. Also reports had black BM 09/15/11 and had norma lbm 1.65 days ago. Omn exam alert, nad lungs cta cor rrr with mid-systolic click. abd non tender.  Doug Sou, MD 09/20/11 [phone]

## 2011-11-20 ENCOUNTER — Telehealth: Payer: Self-pay | Admitting: Gastroenterology

## 2011-11-20 NOTE — Telephone Encounter (Signed)
Agree with advice. Her PCP or nephrologist needs to evaluate her complaint

## 2011-11-20 NOTE — Telephone Encounter (Signed)
Patient's husband reports that his wife is weak and her urine is black.  He is concerned that she is having "bleeding inside again".  Patient was admitted last fall for GI bleed.  He says her stools are not dark or black just the urine.  The patient has a history of acute renal failure from last  Fall.  I have recommended he contact her primary care MD today to have her seen.  He reports that they were to be sent to a nephrologist but haven't heard about the appt yet.  I have again asked him to call his primary care MD.  The husband verbalized understanding

## 2012-01-06 IMAGING — CR DG CHEST 2V
2 series · 2 of 2 positions shown · non-contrast
Comparison: Portable chest x-ray of 04/26/2010

CLINICAL DATA: Unstable angina, pacemaker insertion

CHEST - 2 VIEW

[w chest pa]
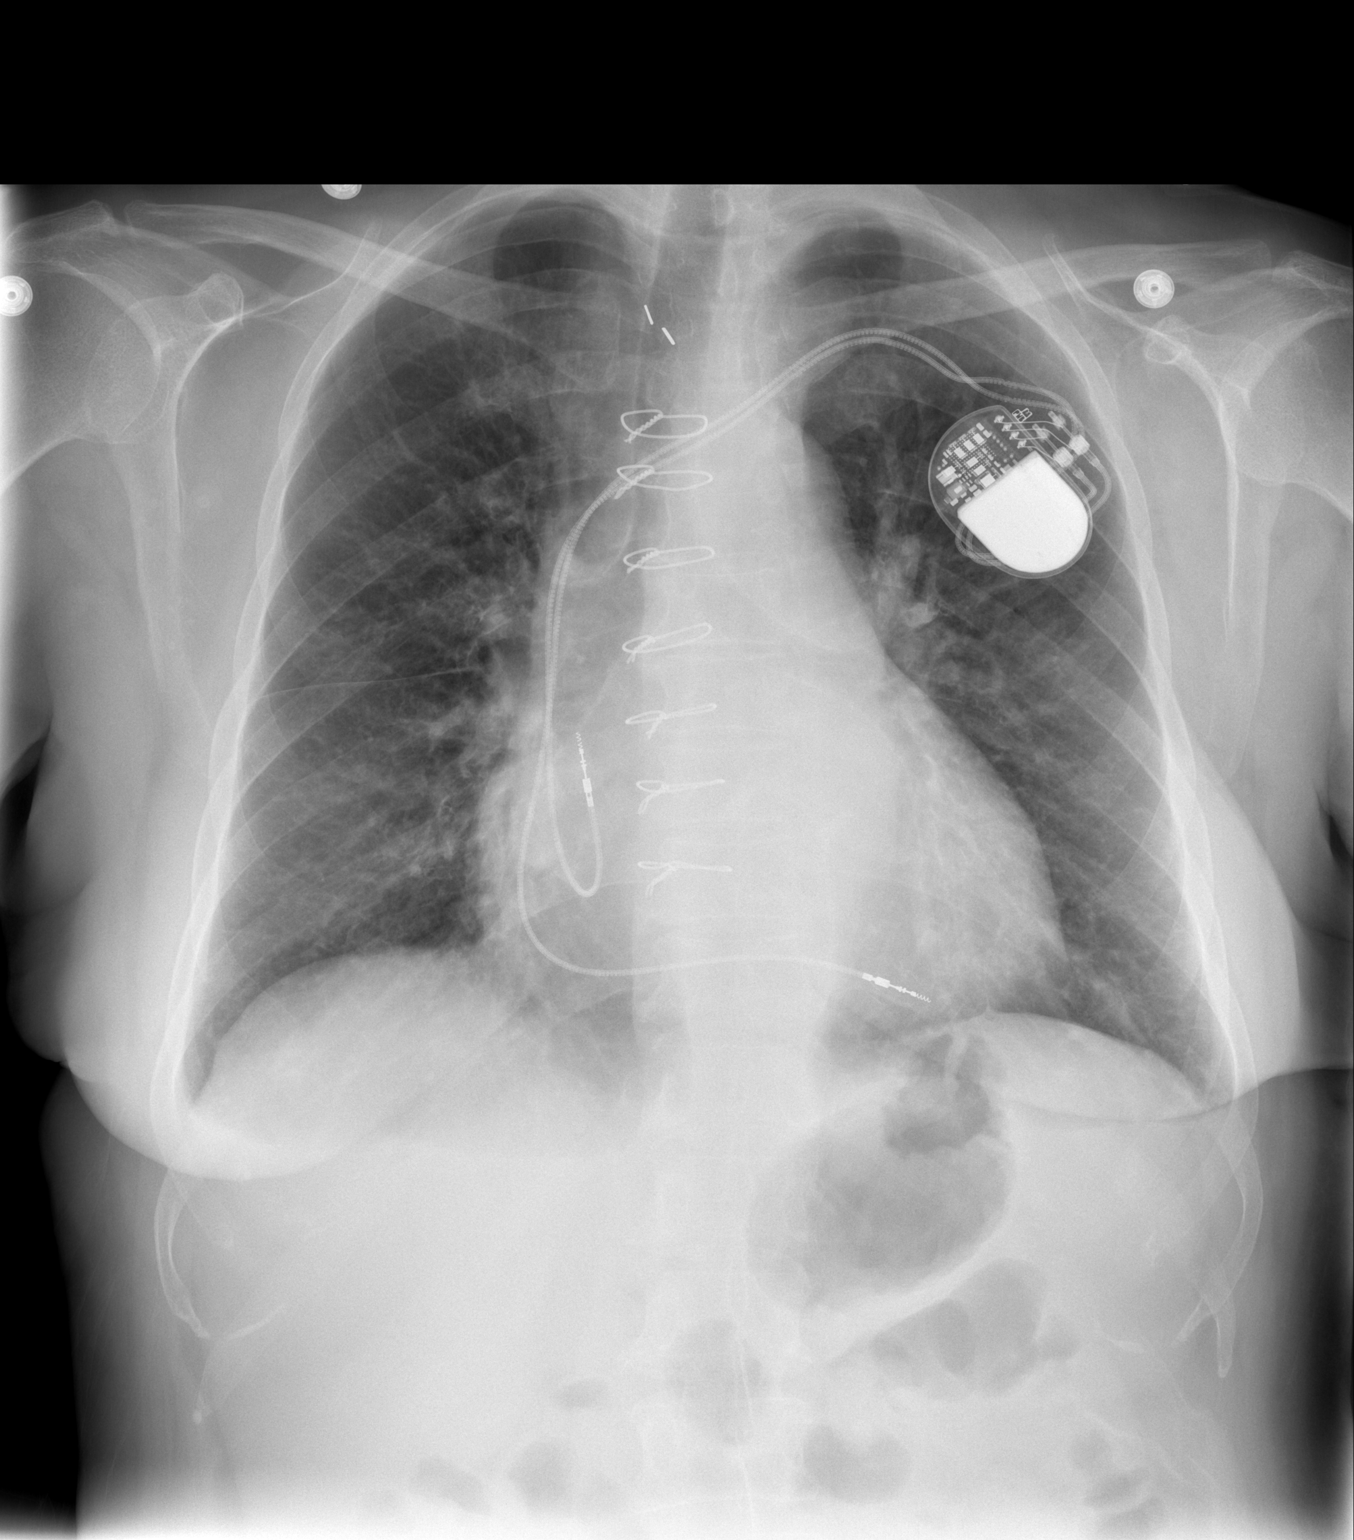

[w chest lat]
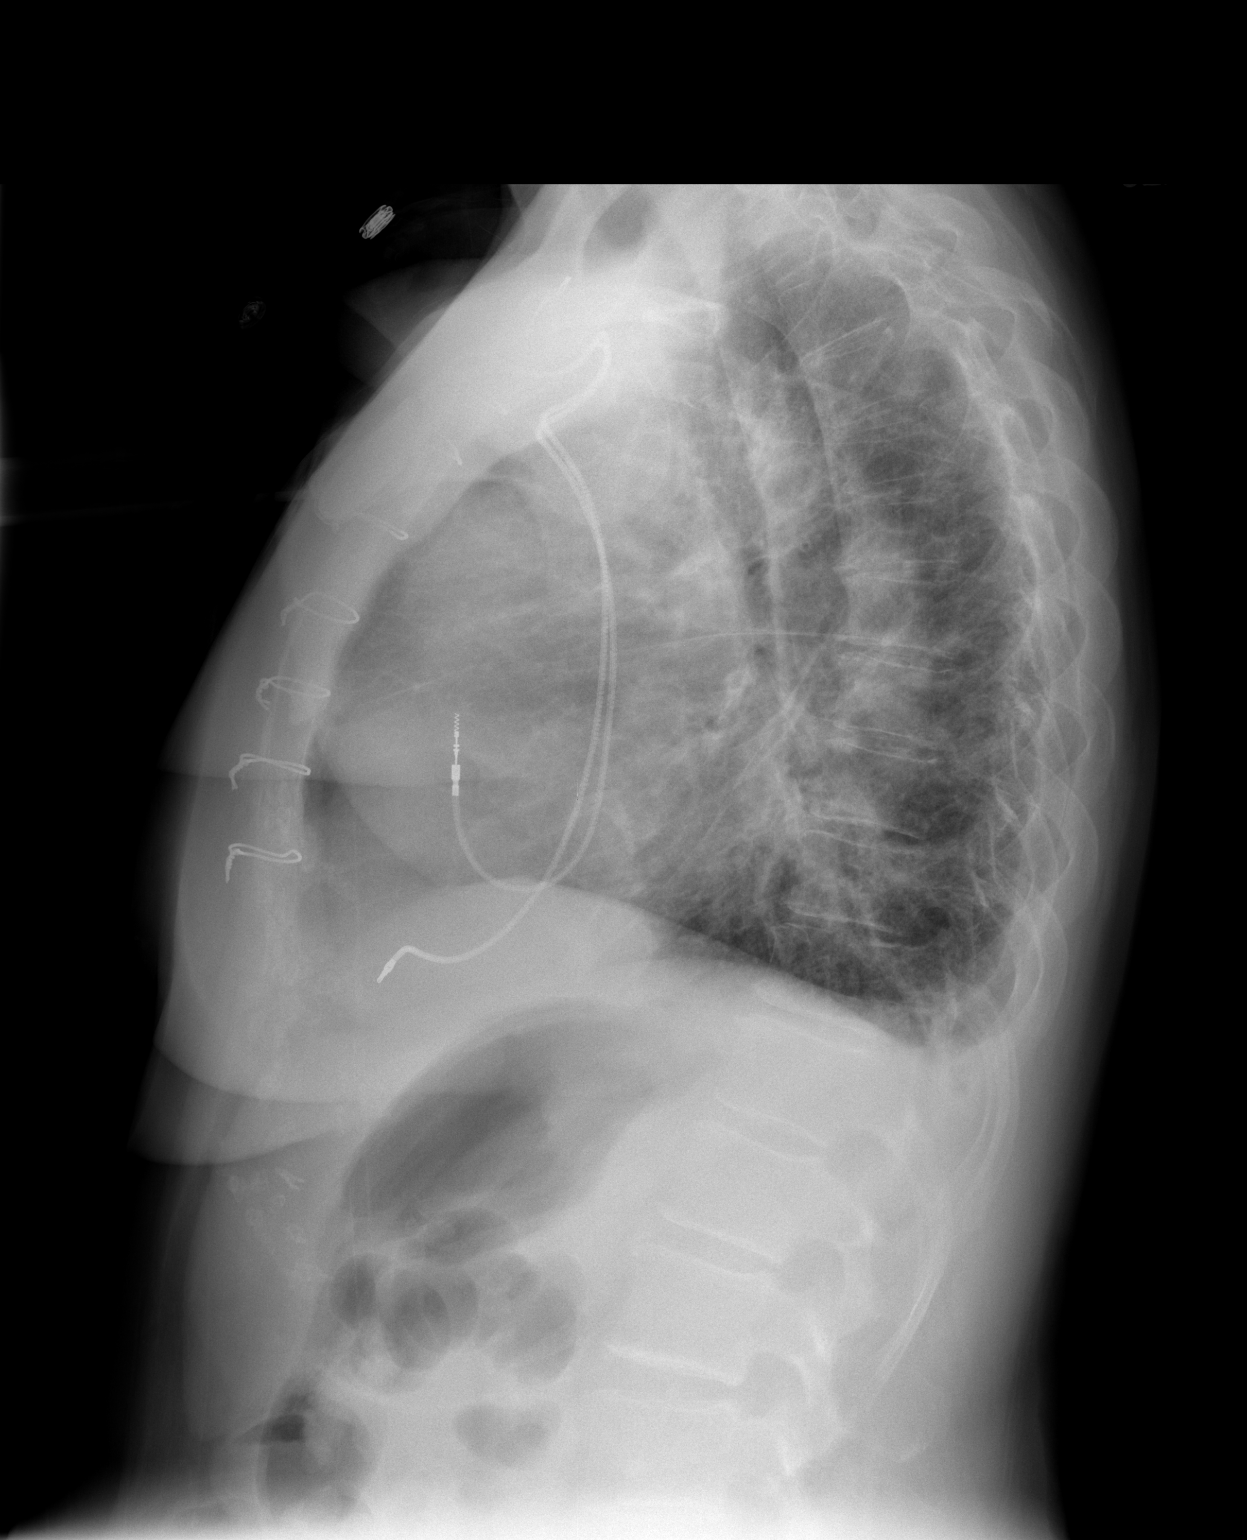

[2 of 2 positions shown; findings below may reference images not displayed]

FINDINGS: The lungs are clear.  There is cardiomegaly present.
There is mild pulmonary vascular congestion with small pleural
effusions blunting the posterior costophrenic angles.  A permanent
pacemaker is now present.  Median sternotomy sutures are noted.  No
bony abnormality is seen.
IMPRESSION: Cardiomegaly with mild congestion and small effusions.  Permanent
pacemaker is now present.

## 2012-01-15 IMAGING — CT CT HEAD W/O CM
1 of 2 series · 13 of 30 positions shown, 17 images · non-contrast
Comparison: 09/09/2007

CLINICAL DATA: Headache/nosebleed

CT HEAD WITHOUT CONTRAST
TECHNIQUE: Contiguous axial images were obtained from the base of
the skull through the vertex without contrast.

[Series 2: brain · axial · 0.47mm/px · z∈[+121,+242]mm · 13 of 28 slices shown, 17 images]
[im 2/28  brain]
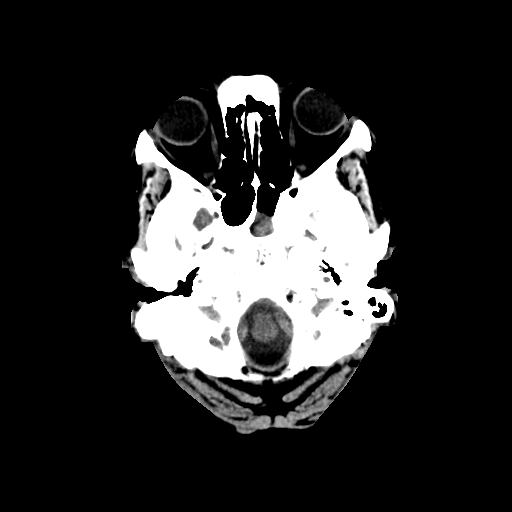
[im 2/28  bone]
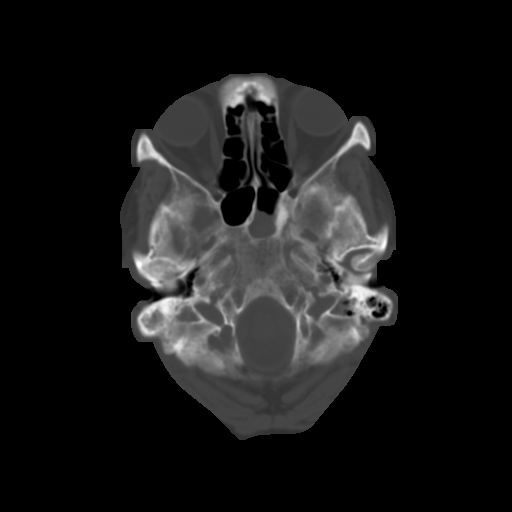
[im 4/28  brain]
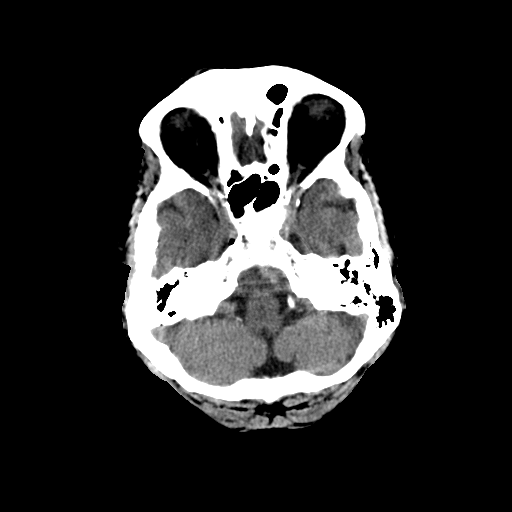
[im 6/28  brain]
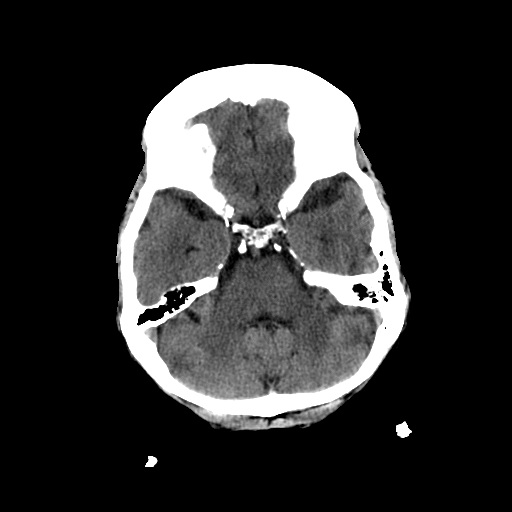
[im 8/28  brain]
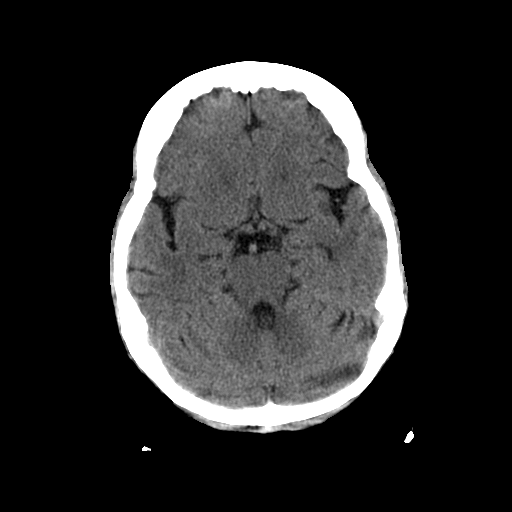
[im 10/28  brain]
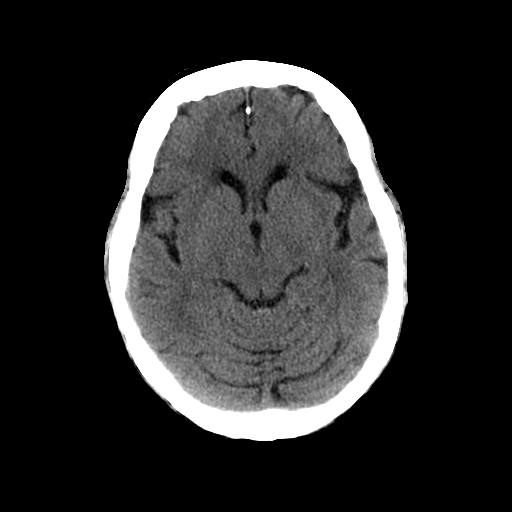
[im 10/28  bone]
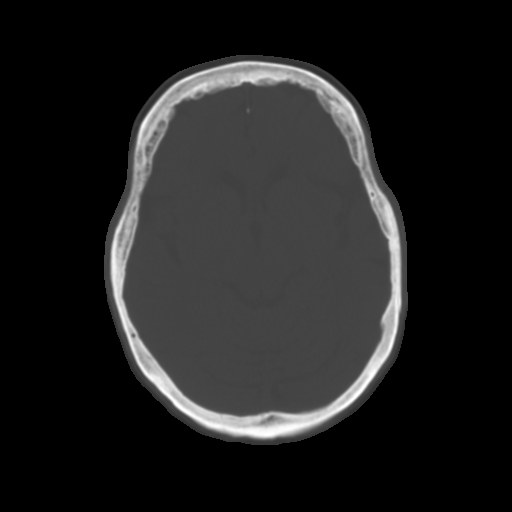
[im 12/28  brain]
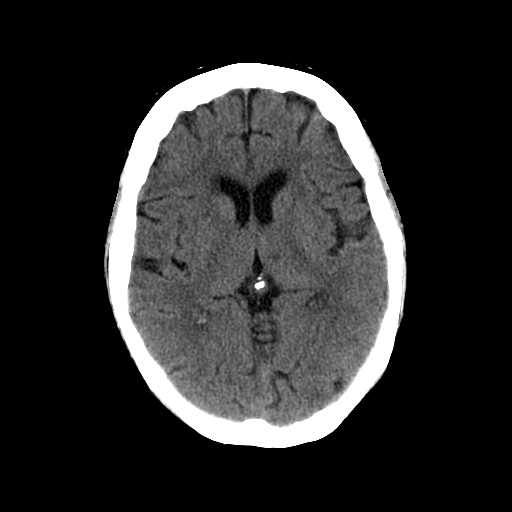
[im 14/28  brain]
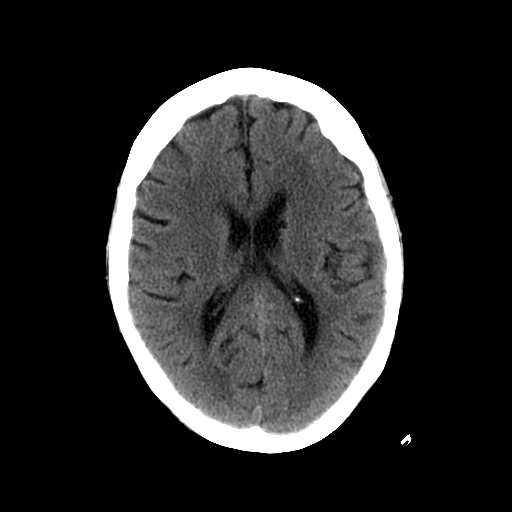
[im 16/28  brain]
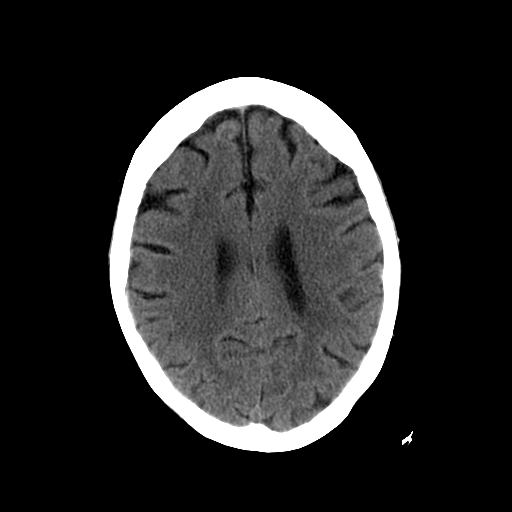
[im 18/28  brain]
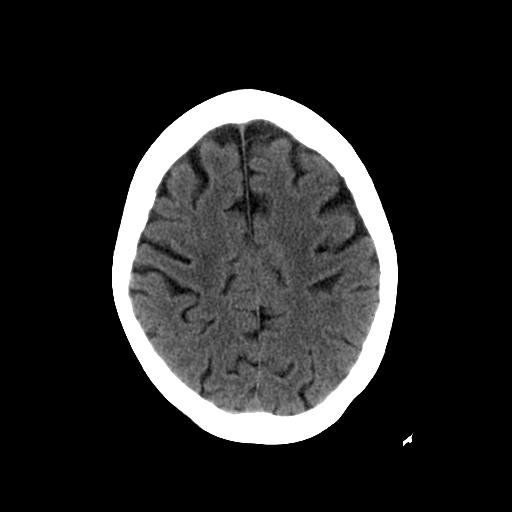
[im 18/28  bone]
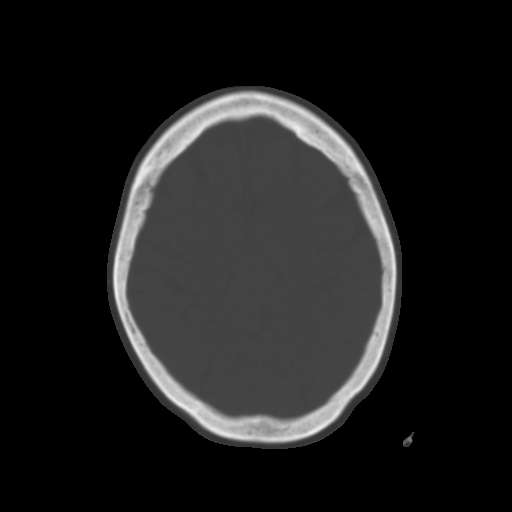
[im 20/28  brain]
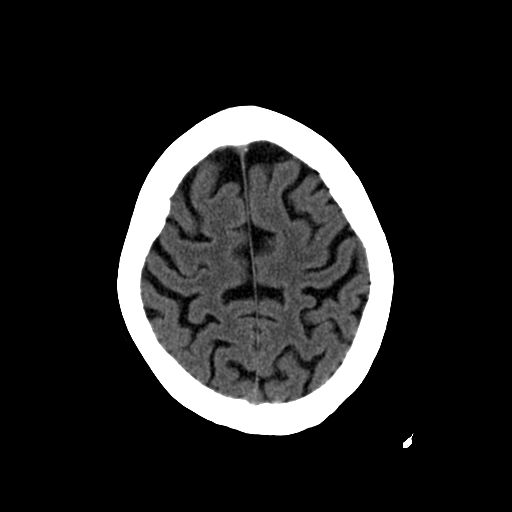
[im 22/28  brain]
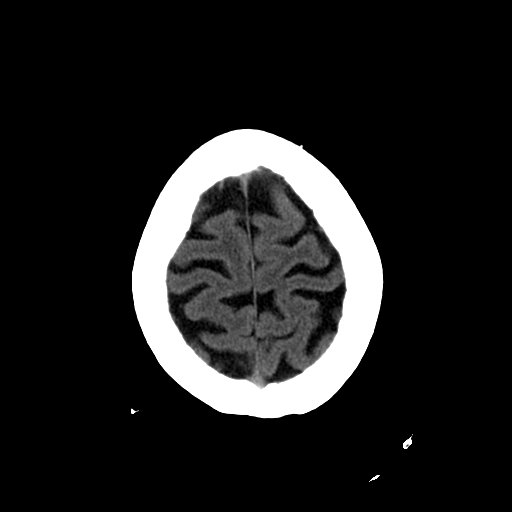
[im 24/28  brain]
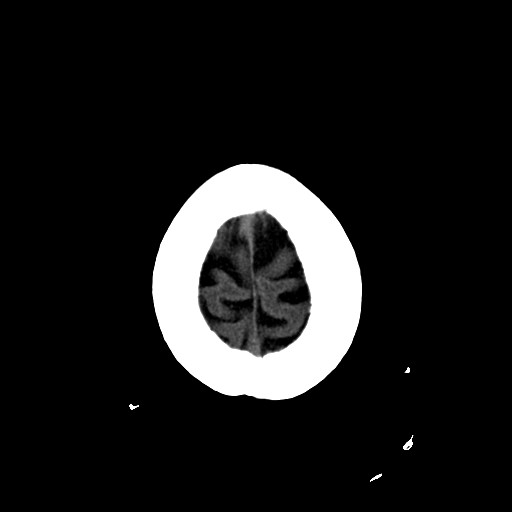
[im 26/28  brain]
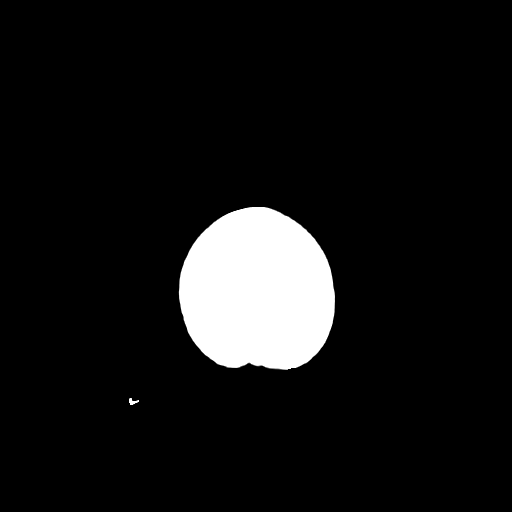
[im 26/28  bone]
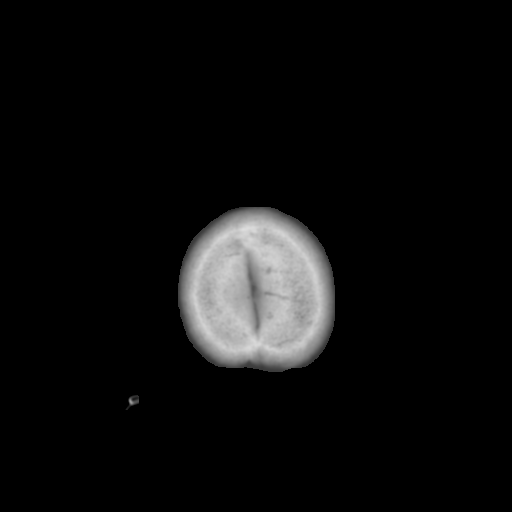

[13 of 30 positions shown; findings below may reference images not displayed]

FINDINGS: Ventricular size and CSF spaces within normal limits for
age.  There is mild microvascular white matter disease.  There is
an old left caudate head lacunar infarct without change.  No
evidence for acute infarct, hemorrhage, or mass lesion. No extra-
axial fluid collections or midline shift.  Calvarium intact.  No
fluid in the sinuses visualized.
IMPRESSION: Chronic changes as above - no acute abnormality or significant
interval change since 09/09/2007.

## 2012-07-03 DIAGNOSIS — R55 Syncope and collapse: Secondary | ICD-10-CM

## 2012-07-04 DIAGNOSIS — I4891 Unspecified atrial fibrillation: Secondary | ICD-10-CM

## 2012-07-05 ENCOUNTER — Observation Stay (HOSPITAL_COMMUNITY)
Admission: EM | Admit: 2012-07-05 | Discharge: 2012-07-06 | Disposition: A | Discharge status: Home / Self Care | Payer: Medicare Other | Source: Other Acute Inpatient Hospital | Attending: Cardiovascular Disease | Admitting: Cardiovascular Disease

## 2012-07-05 ENCOUNTER — Encounter (HOSPITAL_COMMUNITY): Payer: Self-pay | Admitting: *Deleted

## 2012-07-05 DIAGNOSIS — I10 Essential (primary) hypertension: Secondary | ICD-10-CM | POA: Insufficient documentation

## 2012-07-05 DIAGNOSIS — I4891 Unspecified atrial fibrillation: Secondary | ICD-10-CM | POA: Insufficient documentation

## 2012-07-05 DIAGNOSIS — K219 Gastro-esophageal reflux disease without esophagitis: Secondary | ICD-10-CM | POA: Insufficient documentation

## 2012-07-05 DIAGNOSIS — D649 Anemia, unspecified: Secondary | ICD-10-CM | POA: Diagnosis present

## 2012-07-05 DIAGNOSIS — R61 Generalized hyperhidrosis: Secondary | ICD-10-CM | POA: Insufficient documentation

## 2012-07-05 DIAGNOSIS — Z952 Presence of prosthetic heart valve: Secondary | ICD-10-CM | POA: Insufficient documentation

## 2012-07-05 DIAGNOSIS — Z950 Presence of cardiac pacemaker: Secondary | ICD-10-CM | POA: Insufficient documentation

## 2012-07-05 DIAGNOSIS — R609 Edema, unspecified: Secondary | ICD-10-CM | POA: Insufficient documentation

## 2012-07-05 DIAGNOSIS — R0602 Shortness of breath: Secondary | ICD-10-CM | POA: Insufficient documentation

## 2012-07-05 DIAGNOSIS — R55 Syncope and collapse: Secondary | ICD-10-CM | POA: Diagnosis present

## 2012-07-05 DIAGNOSIS — I2510 Atherosclerotic heart disease of native coronary artery without angina pectoris: Secondary | ICD-10-CM | POA: Insufficient documentation

## 2012-07-05 LAB — BASIC METABOLIC PANEL
BUN: 21 mg/dL (ref 6–23)
CO2: 29 mEq/L (ref 19–32)
Chloride: 104 mEq/L (ref 96–112)
Glucose, Bld: 95 mg/dL (ref 70–99)
Potassium: 3.9 mEq/L (ref 3.5–5.1)
Sodium: 138 mEq/L (ref 135–145)

## 2012-07-05 LAB — CBC
HCT: 26 % — ABNORMAL LOW (ref 36.0–46.0)
Hemoglobin: 8.5 g/dL — ABNORMAL LOW (ref 12.0–15.0)
MCH: 29.4 pg (ref 26.0–34.0)
MCHC: 32.7 g/dL (ref 30.0–36.0)
RBC: 2.89 MIL/uL — ABNORMAL LOW (ref 3.87–5.11)

## 2012-07-05 MED ORDER — LISINOPRIL 5 MG PO TABS
5.0000 mg | ORAL_TABLET | Freq: Every day | ORAL | Status: DC
  Administered 2012-07-05 – 2012-07-06 (×2): 5 mg via ORAL
  Filled 2012-07-05 (×2): qty 1

## 2012-07-05 MED ORDER — POLYVINYL ALCOHOL 1.4 % OP SOLN
2.0000 [drp] | Freq: Every day | OPHTHALMIC | Status: DC | PRN
  Filled 2012-07-05: qty 15

## 2012-07-05 MED ORDER — AMIODARONE HCL 200 MG PO TABS
300.0000 mg | ORAL_TABLET | Freq: Every day | ORAL | Status: DC
  Administered 2012-07-05 – 2012-07-06 (×2): 300 mg via ORAL
  Filled 2012-07-05 (×2): qty 1

## 2012-07-05 MED ORDER — PANTOPRAZOLE SODIUM 40 MG PO TBEC
40.0000 mg | DELAYED_RELEASE_TABLET | Freq: Every day | ORAL | Status: DC
  Administered 2012-07-05: 40 mg via ORAL
  Filled 2012-07-05: qty 1

## 2012-07-05 MED ORDER — LEVETIRACETAM 500 MG PO TABS
500.0000 mg | ORAL_TABLET | Freq: Two times a day (BID) | ORAL | Status: DC
  Administered 2012-07-05 – 2012-07-06 (×2): 500 mg via ORAL
  Filled 2012-07-05 (×4): qty 1

## 2012-07-05 MED ORDER — WARFARIN - PHARMACIST DOSING INPATIENT: Freq: Every day | Status: DC

## 2012-07-05 MED ORDER — HYPROMELLOSE (GONIOSCOPIC) 2.5 % OP SOLN
2.0000 [drp] | Freq: Every day | OPHTHALMIC | Status: DC | PRN
  Filled 2012-07-05: qty 15

## 2012-07-05 MED ORDER — WARFARIN SODIUM 2.5 MG PO TABS
2.5000 mg | ORAL_TABLET | Freq: Once | ORAL | Status: AC
  Administered 2012-07-05: 2.5 mg via ORAL
  Filled 2012-07-05: qty 1

## 2012-07-05 MED ORDER — ATORVASTATIN CALCIUM 40 MG PO TABS
40.0000 mg | ORAL_TABLET | Freq: Every day | ORAL | Status: DC
  Administered 2012-07-05: 40 mg via ORAL
  Filled 2012-07-05 (×2): qty 1

## 2012-07-05 NOTE — H&P (Addendum)
Alice Ballard is an 71 y.o. female.   Chief Complaint:  Dizziness/presyncope HPI:   The patient is a 71 year old Caucasian female with history of valvular heart disease. Rheumatic fever as a child. Her mitral valve replaced in 1994 an aortic valve replaced because of endocarditis.  Is on chronic Coumadin therapy. Her history also includes pacemaker, paroxysmal atrial fibrillation, hypertension, seizure disorder, coronary disease, hyperlipidemia, cerebrovascular disease, chest esophageal reflux disease.  Her last office visit with Dr. Clarene Duke was in April 2013. She had a problem with gross hematuria back in January of this year.    Patient presents from [facility] secondary to dizziness this past Thursday at 1200hrs.  She states that she had gone to do some laundry and began losing vision in her right eye and having right-sided weakness. She became wobbly and had to keep herself from falling. She also became incontinent of bowel. She did not lose consciousness.  She also reports some right lower extremity edema on occasion, shortness of breath mention being diaphoretic at one point. She denies nausea, vomiting, chest pain, abdominal pain, cough, congestion, fever, sore throat, dysuria, hematuria, hematochezia. She has had not not had another episode since Thursday.    Past Medical History  Diagnosis Date  . UTI (urinary tract infection)   . Sick sinus syndrome   . Paroxysmal atrial fibrillation   . Hypertension   . Seizure disorder   . Coronary artery disease   . Hyperlipidemia   . Cerebral vascular disease   . GERD (gastroesophageal reflux disease)   . Tubular adenoma of colon 11/2010  . Hemorrhoids   . Hiatal hernia   . Fatty liver   . Spinal stenosis   . Gastric polyp     Past Surgical History  Procedure Date  . Pacemaker placement 05/2010  . Aortic and mitral valve replacement 1994    St. Jude  . Mitral valve replacement 1986  . Cardiac catheterization 04/2010    Family History   Problem Relation Age of Onset  . Colon cancer Sister   . Colon polyps Sister    Social History:  reports that she has never smoked. She has never used smokeless tobacco. She reports that she does not drink alcohol or use illicit drugs.  Allergies:  Allergies  Allergen Reactions  . Carbamazepine     REACTION: Headaches    Medications Prior to Admission  Medication Sig Dispense Refill  . acetaminophen (TYLENOL) 500 MG tablet Take 1,000 mg by mouth every 6 (six) hours as needed. For pain      . amiodarone (PACERONE) 200 MG tablet Take 300 mg by mouth daily. Take one and 1/2 tablets by mouth once daily      . Ascorbic Acid (VITAMIN C) 1000 MG tablet Take 1,000 mg by mouth daily.       [name] atorvastatin (LIPITOR) 80 MG tablet Take 40 mg by mouth at bedtime.       . Cholecalciferol (HM VITAMIN D3) 2000 UNITS CAPS Take 1 capsule by mouth daily.       . hydroxypropyl methylcellulose (ISOPTO TEARS) 2.5 % ophthalmic solution Place 2 drops into both eyes daily as needed. For dryness      . levETIRAcetam (KEPPRA) 500 MG tablet Take 500 mg by mouth 2 (two) times daily.       [name] lisinopril (PRINIVIL,ZESTRIL) 20 MG tablet Take 20 mg by mouth daily.        . pantoprazole (PROTONIX) 40 MG tablet Take 40 mg by  mouth daily.        [name] warfarin (COUMADIN) 5 MG tablet Take 2.5 mg by mouth daily. Take as directed        No results found for this or any previous visit (from the past 48 hour(s)). No results found.  Review of Systems  Constitutional: Positive for diaphoresis. Negative for fever.  HENT: Negative for congestion and sore throat.   Eyes: Negative for blurred vision.  Respiratory: Positive for shortness of breath (occassional). Negative for cough.   Cardiovascular: Positive for leg swelling (Right). Negative for chest pain, palpitations, orthopnea and PND.  Gastrointestinal: Negative for nausea, vomiting, diarrhea, constipation and blood in stool.       Incontinent bowel on Thursday    Genitourinary: Negative for dysuria and hematuria.  Neurological: Positive for dizziness. Negative for weakness and headaches.    Blood pressure 139/57, pulse 70, temperature 98.1 F (36.7 C), temperature source Oral, resp. rate 21, height 5\' 4"  (1.626 m), weight 52.6 kg (115 lb 15.4 oz), SpO2 100.00%. Physical Exam  Constitutional: She is oriented to person, place, and time. She appears well-developed and well-nourished. No distress.  HENT:  Head: Normocephalic and atraumatic.  Eyes: EOM are normal. Pupils are equal, round, and reactive to light. No scleral icterus.  Neck: Normal range of motion. Neck supple. No JVD present.  Cardiovascular: Normal rate and regular rhythm.   Murmur heard.  Systolic murmur is present with a grade of 2/6  Pulses:      Radial pulses are 2+ on the right side, and 2+ on the left side.       Dorsalis pedis pulses are 2+ on the right side, and 0 on the left side.       Posterior tibial pulses are 0 on the right side, and 0 on the left side.       No carotid bruit.  Mech valve clicks.  Feet are warm  Respiratory: Effort normal. She has no wheezes. She has no rales.  GI: Soft. Bowel sounds are normal. She exhibits no distension. There is no tenderness.  Musculoskeletal: She exhibits no edema.  Lymphadenopathy:    She has no cervical adenopathy.  Neurological: She is alert and oriented to person, place, and time. She exhibits normal muscle tone.  Skin: Skin is warm and dry.  Psychiatric: She has a normal mood and affect.     Assessment/Plan Patient Active Hospital Problem List: Pre-syncope (07/05/2012) Atrial fibrillation (10/09/2010) Pacemaker: Medtronic 2011 for tachy brady AFIB (07/05/2012) Aortic valve replaced (07/05/2012) Mitral valve replaced 1994 (07/05/2012)  Plan:  Transferred from [facility] hospital with presyncope.  No further episodes..  Will interrogate pacemaker.  MD to follow.   HAGER, BRYAN 07/05/2012, 2:03 PM   I have seen and  examined the patient along with Wilburt Finlay, PA.  I have reviewed the chart, notes and new data.  I agree with PA's note.  - By history, the events sound most compatible with hypovolemia and orthostatic hypotension, now improved. (rather than AS related presyncope) - By exam, the aortic ejection jet is relatively early peaking, compatible with non severe AS. - An extensive workup for prosthetic valve malfunction was performed in 2011 (including heart cath, TEE and valve fluoroscopy) and the decision was made that although the valve was small (19 mm mechanical prosthesis), it was not malfunctioning. The transvalvular gradients at that time were estimated by TTE to be even higher than on the TTE from yesterday at [facility]. - She is purposefully kept  at a lower INR of 2-2.5 due to history of recurrent GI bleeding. No recent overt bleeding - She has lost a lot of weight in the last 2 years and may no longer tolerate this dose of ACEi.  PLAN: Recheck hemoglobin.  Reduce ACEi. Interrogate PPM. Ambulate - if asymptomatic, bring back for outpatient TEE.   Thurmon Fair, MD, [facility] [phone] 07/05/2012, 3:20 PM

## 2012-07-05 NOTE — Progress Notes (Signed)
ANTICOAGULATION CONSULT NOTE - Initial Consult  Pharmacy Consult for Coumadin Indication: MVR/AVR/PAF  Allergies  Allergen Reactions  . Carbamazepine     REACTION: Headaches    Patient Measurements: Height: 5\' 4"  (162.6 cm) Weight: 115 lb 15.4 oz (52.6 kg) IBW/kg (Calculated) : 54.7   Vital Signs: Temp: 98.1 F (36.7 C) (08/31 1148) Temp src: Oral (08/31 1148) BP: 139/57 mmHg (08/31 1148) Pulse Rate: 70  (08/31 1148)  Labs:  [facility] 07/05/12 1418  HGB 8.5*  HCT 26.0*  PLT 165  APTT --  LABPROT --  INR --  HEPARINUNFRC --  CREATININE --  CKTOTAL --  CKMB --  TROPONINI --    INR 8/31 at [facility] = 2.5  Estimated Creatinine Clearance: 34.4 ml/min (by C-G formula based on Cr of 1.28).   Medical History: Past Medical History  Diagnosis Date  . UTI (urinary tract infection)   . Sick sinus syndrome   . Paroxysmal atrial fibrillation   . Hypertension   . Seizure disorder   . Coronary artery disease   . Hyperlipidemia   . Cerebral vascular disease   . GERD (gastroesophageal reflux disease)   . Tubular adenoma of colon 11/2010  . Hemorrhoids   . Hiatal hernia   . Fatty liver   . Spinal stenosis   . Gastric polyp     Medications:  Coumadin 2.5mg  daily except 1.25 on MWF  Assessment: 71 yo F with hx valvular heart disease 2/2 rheumatic fever as a child. S/p MVR 1994, [id number] 2/2 endocarditis. Presented to [facility] 829 with R sided weakness, dizziness, and vision changes to R eye (now resolved). Pt transferred to [facility] 07/05/2012 for work-up of presyncope.   To continue Coumadin per pharmacy.  Noted lower INR goal (2-2.5) due to hematuria and GIB Jan 2013.  Goal of Therapy:  INR 2-2.5   Plan:  Continue home Coumadin dose of 2.5mg  tonight. Daily INR.  [employer], Pharm.D., BCPS Clinical Pharmacist Pager [phone] 07/05/2012 3:16 PM

## 2012-07-06 DIAGNOSIS — D649 Anemia, unspecified: Secondary | ICD-10-CM | POA: Diagnosis present

## 2012-07-06 LAB — BASIC METABOLIC PANEL
BUN: 23 mg/dL (ref 6–23)
CO2: 27 mEq/L (ref 19–32)
Calcium: 9.5 mg/dL (ref 8.4–10.5)
GFR calc non Af Amer: 33 mL/min — ABNORMAL LOW (ref >90)
Glucose, Bld: 98 mg/dL (ref 70–99)

## 2012-07-06 LAB — CBC
Hemoglobin: 8.6 g/dL — ABNORMAL LOW (ref 12.0–15.0)
MCH: 29.4 pg (ref 26.0–34.0)
MCHC: 32.3 g/dL (ref 30.0–36.0)
MCV: 90.8 fL (ref 78.0–100.0)
Platelets: 180 10*3/uL (ref 150–400)
RBC: 2.93 MIL/uL — ABNORMAL LOW (ref 3.87–5.11)

## 2012-07-06 LAB — PROTIME-INR: Prothrombin Time: 33 seconds — ABNORMAL HIGH (ref 11.6–15.2)

## 2012-07-06 MED ORDER — FOLIC ACID 1 MG PO TABS: 1.0000 mg | ORAL_TABLET | Freq: Every day | ORAL | Status: AC

## 2012-07-06 MED ORDER — LISINOPRIL 5 MG PO TABS: 5.0000 mg | ORAL_TABLET | Freq: Every day | ORAL | Status: DC

## 2012-07-06 MED ORDER — FERROUS SULFATE 325 (65 FE) MG PO TABS: 325.0000 mg | ORAL_TABLET | Freq: Every day | ORAL | Status: DC

## 2012-07-06 NOTE — Progress Notes (Signed)
ANTICOAGULATION CONSULT NOTE - Follow-up Consult  Pharmacy Consult for Coumadin Indication: MVR/AVR/PAF  Allergies  Allergen Reactions  . Tegretol (Carbamazepine) Other (See Comments)    REACTION: Headaches  . Shellfish Allergy Rash    Patient Measurements: Height: 5\' 4"  (162.6 cm) Weight: 115 lb 15.4 oz (52.6 kg) IBW/kg (Calculated) : 54.7   Vital Signs: Temp: 98.1 F (36.7 C) (09/01 0742) Temp src: Oral (09/01 0742) BP: 119/49 mmHg (09/01 0722) Pulse Rate: 70  (09/01 0722)  Labs:  [facility] 07/06/12 0445 07/05/12 1418  HGB 8.6* 8.5*  HCT 26.6* 26.0*  PLT 180 165  APTT -- --  LABPROT 33.0* --  INR 3.17* --  HEPARINUNFRC -- --  CREATININE 1.57* 1.51*  CKTOTAL -- --  CKMB -- --  TROPONINI -- --    INR 8/31 at [facility] = 2.5  Estimated Creatinine Clearance: 28.1 ml/min (by C-G formula based on Cr of 1.57).   Medications:  Coumadin 2.5mg  daily except 1.25 on MWF (PTA)  Assessment: 71 yo F with hx valvular heart disease 2/2 rheumatic fever as a child. S/p MVR 1994, [id number] 2/2 endocarditis. Presented to [facility] 829 with R sided weakness, dizziness, and vision changes to R eye (now resolved). Pt transferred to [facility] 07/05/2012 for work-up of presyncope.  Considering d/c home today with further outpt work-up of syncope.   Noted lower INR goal (2-2.5) due to hematuria and GIB Jan 2013.  INR currently above goal.  Goal of Therapy:  INR 2-2.5   Plan:  No Coumadin tonight. Continue daily INR.  [employer], Pharm.D., BCPS Clinical Pharmacist Pager [phone] 07/06/2012 10:07 AM

## 2012-07-06 NOTE — Progress Notes (Signed)
[facility]  Subjective: No Complaints   Objective: Vital signs in last 24 hours: Temp:  [98 F (36.7 C)-98.5 F (36.9 C)] 98.1 F (36.7 C) (09/01 0742) Pulse Rate:  [70-71] 70  (09/01 0341) Resp:  [17-23] 18  (09/01 0742) BP: (95-141)/(34-58) 103/45 mmHg (09/01 0341) SpO2:  [97 %-100 %] 98 % (09/01 0742) Weight:  [52.6 kg (115 lb 15.4 oz)] 52.6 kg (115 lb 15.4 oz) (08/31 1148) Last BM Date: 07/04/12  Intake/Output from previous day: 08/31 0701 - 09/01 0700 In: 320 [P.O.:320] Out: 150 [Urine:150] Intake/Output this shift:    Medications Current Facility-Administered Medications  Medication Dose Route Frequency Provider Last Rate Last Dose  . amiodarone (PACERONE) tablet 300 mg  300 mg Oral Daily Wilburt Finlay, PA   300 mg at 07/05/12 1640  . atorvastatin (LIPITOR) tablet 40 mg  40 mg Oral QHS Wilburt Finlay, PA   40 mg at 07/05/12 2131  . levETIRAcetam (KEPPRA) tablet 500 mg  500 mg Oral BID Wilburt Finlay, PA   500 mg at 07/05/12 1651  . lisinopril (PRINIVIL,ZESTRIL) tablet 5 mg  5 mg Oral Daily Melora Menon, MD   5 mg at 07/05/12 1650  . pantoprazole (PROTONIX) EC tablet 40 mg  40 mg Oral Q1200 Wilburt Finlay, PA   40 mg at 07/05/12 1650  . polyvinyl alcohol (LIQUIFILM TEARS) 1.4 % ophthalmic solution 2 drop  2 drop Both Eyes Daily PRN Dorota Heinrichs, MD      . warfarin (COUMADIN) tablet 2.5 mg  2.5 mg Oral ONCE-1800 Kimberly Ballard Hammons, PHARMD   2.5 mg at 07/05/12 1715  . Warfarin - Pharmacist Dosing Inpatient   Does not apply q1800 Judie Bonus Hammons, PHARMD      . DISCONTD: hydroxypropyl methylcellulose (ISOPTO TEARS) 2.5 % ophthalmic solution 2 drop  2 drop Both Eyes Daily PRN Wilburt Finlay, PA        PE: General appearance: alert, cooperative and no distress Lungs: clear to auscultation bilaterally Heart: regular rate and rhythm and 16 sys MM Extremities: No LEE Pulses: 2+ and symmetric Skin: warm and dry Neurologic: Grossly  normal  Lab Results:   Basename 07/06/12 0445 07/05/12 1418  WBC 4.3 4.2  HGB 8.6* 8.5*  HCT 26.6* 26.0*  PLT 180 165   BMET  Basename 07/06/12 0445 07/05/12 1418  NA 141 138  K 4.0 3.9  CL 106 104  CO2 27 29  GLUCOSE 98 95  BUN 23 21  CREATININE 1.57* 1.51*  CALCIUM 9.5 9.1   PT/INR  Basename 07/06/12 0445  LABPROT 33.0*  INR 3.17*    Assessment/Plan  Principal Problem:  *Pre-syncope Active Problems:  Atrial fibrillation  Pacemaker: Medtronic 2011 for tachy brady AFIB  Aortic valve replaced  Mitral valve replaced 1994  Normocytic anemia  Plan:   Anemic and stable.  She has an appt with Dr. Leandrew Koyanagi in [place] this month.  Ambulate this AM. DC home with OP TEE.  INR 3.17  Target 2.0-2.5.     LOS: 1 day    HAGER, BRYAN 07/06/2012 7:55 AM  I have seen and examined the patient along with Wilburt Finlay, PA.  I have reviewed the chart, notes and new data.  I agree with PA's note.  Key new complaints: feels well Key examination changes: sharp prosthetic valve clicks, early to mid peaking aortic ejection murmur Key new findings / data: hemoglobin moderately low but stable.  PLAN: If not symptomatic with ambulation, dc home. Reduce  warfarin to achieve INR 2-2.5, due to GI bleeding history. Folic acid and iron supplements. Reduced lisinopril dose. Recheck INR early next week. Outpatient TEE.  Thurmon Fair, MD, [facility] [phone] 07/06/2012, 8:17 AM

## 2012-07-07 NOTE — Discharge Summary (Signed)
Physician Discharge Summary  Patient ID: Alice Ballard MRN: [id number] DOB/AGE: 1941-07-20 71 y.o.  Admit date: 07/05/2012 Discharge date: 07/07/2012  Admission Diagnoses:  Presyncope.  Discharge Diagnoses:  Principal Problem:  *Pre-syncope Active Problems:  Atrial fibrillation  Pacemaker: Medtronic 2011 for tachy brady AFIB  Aortic valve replaced  Mitral valve replaced 1994  Normocytic anemia   Discharged Condition: stable  Hospital Course:   The patient is a 71 year old Caucasian female with history of valvular heart disease. Rheumatic fever as a child. Her mitral valve replaced in 1994 an aortic valve replaced because of endocarditis. Is on chronic Coumadin therapy. Her history also includes pacemaker, paroxysmal atrial fibrillation, hypertension, seizure disorder, coronary disease, hyperlipidemia, cerebrovascular disease, chest esophageal reflux disease. Her last office visit with Dr. Clarene Duke was in April 2013. She had a problem with gross hematuria back in January of this year.   Patient presents from [facility] secondary to dizziness this past Thursday at 1200hrs. She stated that she had gone to do some laundry and began losing vision in her right eye and having right-sided weakness. She became wobbly and had to keep herself from falling. She also became incontinent of bowel. She did not lose consciousness. She also reports some right lower extremity edema on occasion, shortness of breath mention being diaphoretic at one point. She denies nausea, vomiting, chest pain, abdominal pain, cough, congestion, fever, sore throat, dysuria, hematuria, hematochezia. She has had not not had another episode since Thursday.  The patient was admitted for observation.  We rechecked her HGB which was stable.  She was subsequently discharged in stable condition after being seen by Dr. Royann Shivers with plan for outpatient TEE.    Consults: None  Significant Diagnostic Studies:  Interrogated PM    CBC    Component Value Date/Time   WBC 4.3 07/06/2012 0445   RBC 2.93* 07/06/2012 0445   HGB 8.6* 07/06/2012 0445   HCT 26.6* 07/06/2012 0445   PLT 180 07/06/2012 0445   MCV 90.8 07/06/2012 0445   MCH 29.4 07/06/2012 0445   MCHC 32.3 07/06/2012 0445   RDW 12.5 07/06/2012 0445   LYMPHSABS 1.1 09/19/2011 1611   MONOABS 0.3 09/19/2011 1611   EOSABS 0.1 09/19/2011 1611   BASOSABS 0.0 09/19/2011 1611    BMET    Component Value Date/Time   NA 141 07/06/2012 0445   K 4.0 07/06/2012 0445   CL 106 07/06/2012 0445   CO2 27 07/06/2012 0445   GLUCOSE 98 07/06/2012 0445   BUN 23 07/06/2012 0445   CREATININE 1.57* 07/06/2012 0445   CALCIUM 9.5 07/06/2012 0445   GFRNONAA 33* 07/06/2012 0445   GFRAA 38* 07/06/2012 0445      Treatments: None  Discharge Exam: Blood pressure 119/49, pulse 70, temperature 98.1 F (36.7 C), temperature source Oral, resp. rate 18, height 5\' 4"  (1.626 m), weight 52.6 kg (115 lb 15.4 oz), SpO2 98.00%.   Disposition: 01-Home or Self Care  Discharge Orders    Future Orders Please Complete By Expires   Diet - low sodium heart healthy      Increase activity slowly        Medication List  As of 07/07/2012 10:55 AM   TAKE these medications         acetaminophen 500 MG tablet   Commonly known as: TYLENOL   Take 1,000 mg by mouth every 6 (six) hours as needed. For pain      alendronate 70 MG tablet   Commonly known as: FOSAMAX  Take 70 mg by mouth every 7 (seven) days. Take with a full glass of water on an empty stomach. Takes on Sunday.      amiodarone 200 MG tablet   Commonly known as: PACERONE   Take 300 mg by mouth daily.      atorvastatin 80 MG tablet   Commonly known as: LIPITOR   Take 40 mg by mouth at bedtime.      ferrous sulfate 325 (65 FE) MG tablet   Take 1 tablet (325 mg total) by mouth daily.      folic acid 1 MG tablet   Commonly known as: FOLVITE   Take 1 tablet (1 mg total) by mouth daily.      hydrochlorothiazide 25 MG tablet   Commonly known as:  HYDRODIURIL   Take 25 mg by mouth daily.      levETIRAcetam 500 MG tablet   Commonly known as: KEPPRA   Take 500 mg by mouth 2 (two) times daily.      lisinopril 5 MG tablet   Commonly known as: PRINIVIL,ZESTRIL   Take 1 tablet (5 mg total) by mouth daily.      pantoprazole 40 MG tablet   Commonly known as: PROTONIX   Take 40 mg by mouth daily as needed. For heartburn      SYSTANE OP   Place 1 drop into both eyes daily as needed. For dry eyes      vitamin C 1000 MG tablet   Take 1,000 mg by mouth daily.      Vitamin D3 2000 UNITS capsule   Take 2,000 Units by mouth daily.      warfarin 7.5 MG tablet   Commonly known as: COUMADIN   Take 7.5 mg by mouth daily. [facility] Regimen given at [facility]      warfarin 5 MG tablet   Commonly known as: COUMADIN   Take 2.5-5 mg by mouth See admin instructions. Take 2.5mg  on Mon, Wed & Fri & 5mg  on all other days           Follow-up Information    Follow up with CROITORU,MIHAI, MD. (INR check in one week.  Our office will call with date.)    Contact information:   [address] [place] [address] [phone]       Follow up with Thurmon Fair, MD. (Ultrasound of your heart.  OUr office will call with the date and time.)    Contact information:   [address] [place] [address] [phone]          Signed: Wilburt Finlay 07/07/2012, 10:55 AM

## 2012-07-08 NOTE — Progress Notes (Signed)
Retro ur ins review. 

## 2012-07-10 ENCOUNTER — Other Ambulatory Visit: Payer: Self-pay | Admitting: Cardiovascular Disease

## 2012-07-11 ENCOUNTER — Encounter (HOSPITAL_COMMUNITY): Payer: Self-pay

## 2012-07-11 ENCOUNTER — Ambulatory Visit (HOSPITAL_COMMUNITY)
Admission: RE | Admit: 2012-07-11 | Discharge: 2012-07-11 | Disposition: A | Discharge status: Home / Self Care | Payer: Medicare Other | Source: Ambulatory Visit | Attending: Cardiovascular Disease | Admitting: Cardiovascular Disease

## 2012-07-11 ENCOUNTER — Encounter (HOSPITAL_COMMUNITY)
Admission: RE | Disposition: A | Discharge status: Home / Self Care | Payer: Self-pay | Source: Ambulatory Visit | Attending: Cardiovascular Disease

## 2012-07-11 DIAGNOSIS — Z952 Presence of prosthetic heart valve: Secondary | ICD-10-CM

## 2012-07-11 DIAGNOSIS — Z954 Presence of other heart-valve replacement: Secondary | ICD-10-CM | POA: Insufficient documentation

## 2012-07-11 HISTORY — PX: DOPPLER ECHOCARDIOGRAPHY: SHX263

## 2012-07-11 HISTORY — DX: Presence of cardiac pacemaker: Z95.0

## 2012-07-11 HISTORY — PX: TEE WITHOUT CARDIOVERSION: SHX5443

## 2012-07-11 SURGERY — ECHOCARDIOGRAM, TRANSESOPHAGEAL
Anesthesia: Moderate Sedation

## 2012-07-11 MED ORDER — FENTANYL CITRATE 0.05 MG/ML IJ SOLN
INTRAMUSCULAR | Status: DC | PRN
  Administered 2012-07-11: 25 ug via INTRAVENOUS

## 2012-07-11 MED ORDER — SODIUM CHLORIDE 0.9 % IV SOLN
INTRAVENOUS | Status: DC
  Administered 2012-07-11: 500 mL via INTRAVENOUS

## 2012-07-11 MED ORDER — BUTAMBEN-TETRACAINE-BENZOCAINE 2-2-14 % EX AERO
INHALATION_SPRAY | CUTANEOUS | Status: DC | PRN
  Administered 2012-07-11: 2 via TOPICAL

## 2012-07-11 MED ORDER — MIDAZOLAM HCL 10 MG/2ML IJ SOLN
INTRAMUSCULAR | Status: DC | PRN
  Administered 2012-07-11: 2 mg via INTRAVENOUS

## 2012-07-11 MED ORDER — MIDAZOLAM HCL 5 MG/ML IJ SOLN
INTRAMUSCULAR | Status: AC
  Filled 2012-07-11: qty 2

## 2012-07-11 MED ORDER — FENTANYL CITRATE 0.05 MG/ML IJ SOLN
INTRAMUSCULAR | Status: AC
  Filled 2012-07-11: qty 2

## 2012-07-11 NOTE — Progress Notes (Signed)
  Echocardiogram 2D Echocardiogram has been performed.  Alice Ballard 07/11/2012, 11:48 AM

## 2012-07-11 NOTE — H&P (Signed)
Date of Initial H&P: 295284  History reviewed, patient examined, no change in status, stable for surgery. Here for TEE evaluation of prosthetic aortic valve. This procedure has been fully reviewed with the patient and written informed consent has been obtained.  Thurmon Fair, MD, [facility] [phone] office [phone] pager 07/11/2012 10:01 AM

## 2012-07-11 NOTE — CV Procedure (Signed)
INDICATIONS: mechanical prosthesis pannus formation  PROCEDURE:   Informed consent was obtained prior to the procedure. The risks, benefits and alternatives for the procedure were discussed and the patient comprehended these risks.  Risks include, but are not limited to, cough, sore throat, vomiting, nausea, somnolence, esophageal and stomach trauma or perforation, bleeding, low blood pressure, aspiration, pneumonia, infection, trauma to the teeth and death.    After a procedural time-out, the oropharynx was anesthetized with 20% benzocaine spray. The patient was given 2 mg versed and 25 mcg fentanyl for moderate sedation.   The transesophageal probe was inserted in the esophagus and stomach without difficulty and multiple views were obtained.  The patient was kept under observation until the patient left the procedure room.  The patient left the procedure room in stable condition.   Agitated microbubble saline contrast was not administered.  COMPLICATIONS:    There were no immediate complications.  FINDINGS:  No evidence of prosthetic valve dysfunction, no visible pannus. Gradients are unchanged from 2011 and are probably normal for a 19 mm mechanical prosthesis.  RECOMMENDATIONS:     Continue medical therapy. Recent syncope is felt to be due to orthostatic hypotension.  Time Spent Directly with the Patient:  45 minutes   Alice Ballard 07/11/2012, 11:45 AM

## 2012-07-14 ENCOUNTER — Encounter (HOSPITAL_COMMUNITY): Payer: Self-pay

## 2012-07-14 ENCOUNTER — Encounter (HOSPITAL_COMMUNITY): Payer: Self-pay | Admitting: Cardiovascular Disease

## 2013-01-20 ENCOUNTER — Ambulatory Visit: Payer: Self-pay | Admitting: Cardiovascular Disease

## 2013-01-20 DIAGNOSIS — I4891 Unspecified atrial fibrillation: Secondary | ICD-10-CM

## 2013-01-20 DIAGNOSIS — Z7901 Long term (current) use of anticoagulants: Secondary | ICD-10-CM | POA: Insufficient documentation

## 2013-03-30 ENCOUNTER — Encounter: Payer: Self-pay | Admitting: *Deleted

## 2013-04-01 ENCOUNTER — Ambulatory Visit: Payer: Medicare Other | Admitting: Pharmacist Clinician (PhC)/ Clinical Pharmacy Specialist

## 2013-04-04 ENCOUNTER — Encounter: Payer: Self-pay | Admitting: Cardiovascular Disease

## 2013-04-06 ENCOUNTER — Ambulatory Visit (INDEPENDENT_AMBULATORY_CARE_PROVIDER_SITE_OTHER): Payer: Medicare Other | Admitting: Cardiovascular Disease

## 2013-04-06 ENCOUNTER — Other Ambulatory Visit: Payer: Self-pay | Admitting: Cardiovascular Disease

## 2013-04-06 ENCOUNTER — Ambulatory Visit (INDEPENDENT_AMBULATORY_CARE_PROVIDER_SITE_OTHER): Payer: Medicare Other | Admitting: Pharmacist Clinician (PhC)/ Clinical Pharmacy Specialist

## 2013-04-06 ENCOUNTER — Encounter: Payer: Self-pay | Admitting: Cardiovascular Disease

## 2013-04-06 VITALS — BP 140/80 | HR 74 | Ht 66.0 in | Wt 127.2 lb

## 2013-04-06 DIAGNOSIS — R5383 Other fatigue: Secondary | ICD-10-CM

## 2013-04-06 DIAGNOSIS — Z950 Presence of cardiac pacemaker: Secondary | ICD-10-CM

## 2013-04-06 DIAGNOSIS — R5381 Other malaise: Secondary | ICD-10-CM

## 2013-04-06 DIAGNOSIS — E785 Hyperlipidemia, unspecified: Secondary | ICD-10-CM

## 2013-04-06 DIAGNOSIS — I10 Essential (primary) hypertension: Secondary | ICD-10-CM | POA: Insufficient documentation

## 2013-04-06 DIAGNOSIS — I4891 Unspecified atrial fibrillation: Secondary | ICD-10-CM

## 2013-04-06 DIAGNOSIS — R040 Epistaxis: Secondary | ICD-10-CM

## 2013-04-06 DIAGNOSIS — Z954 Presence of other heart-valve replacement: Secondary | ICD-10-CM

## 2013-04-06 DIAGNOSIS — Z952 Presence of prosthetic heart valve: Secondary | ICD-10-CM

## 2013-04-06 DIAGNOSIS — Z7901 Long term (current) use of anticoagulants: Secondary | ICD-10-CM

## 2013-04-06 DIAGNOSIS — Z79899 Other long term (current) drug therapy: Secondary | ICD-10-CM

## 2013-04-06 LAB — POCT INR: INR: 2

## 2013-04-06 LAB — PACEMAKER DEVICE OBSERVATION

## 2013-04-06 MED ORDER — LISINOPRIL 10 MG PO TABS: 30.0000 mg | ORAL_TABLET | Freq: Every day | ORAL | Status: DC

## 2013-04-06 NOTE — Assessment & Plan Note (Signed)
Lipid profile performed less than a year ago showed a focus of 135 was resonant 83 HDL 61 LDL of 57 all within the target range. Repeat lipid profile, cmet, thyroid profile in 3 months which returns for pacemaker check.

## 2013-04-06 NOTE — Assessment & Plan Note (Signed)
At home her blood pressure often exceeds 140 mm Hg. Increase the lisinopril to 30 mg daily.

## 2013-04-06 NOTE — Patient Instructions (Addendum)
Normal device function. Plan to follow up in 3 months for a device interrogation, and in 12 months with Dr. Royann Shivers Your physician has recommended you make the following change in your medication: increase lisinopril to 30 mg once a day

## 2013-04-06 NOTE — Assessment & Plan Note (Addendum)
She has a rather small (19 mm) mechanical aortic valve prosthesis that was placed in 1994. She had a syncopal event about a year ago concern was raised about possible valve dysfunction, as the gradients were elevated. Evaluation fluoroscopy and TEE did not show evidence of any abnormalities in valve motion. By her most recent echocardiogram gradients are a little high with a peak of 51 and a mean of 28 mm Hg. It is likely that the gradients are elevated due to some degree of prosthetic valve mismatch. She has not had any new syncopal events. Also there is no evidence of significant left ventricular hypertrophy that would support valve dysfunction with stenosis.

## 2013-04-06 NOTE — Assessment & Plan Note (Signed)
This is always worse during the wintertime as expected, but still occurs 2-3 times a week in the spring. She wonders whether it could be blood pressure related. The bleeding always occurs from her left nostril and she is able to stop it with local pressure.

## 2013-04-06 NOTE — Assessment & Plan Note (Signed)
St. Jude 29 mm mechanical mitral valve prosthesis placed in 1986 with normal function by echo most recently performed in September of 2013 (mean gradient 7 mm Hg, pressure halftime 44 ms). As the gradients are moderately elevated it would be beneficial to maintain sinus rhythm as long as possible and especially important to avoid rapid ventricular rates.

## 2013-04-06 NOTE — Progress Notes (Signed)
Patient ID: Alice Ballard, female   DOB: 04-21-1941, 72 y.o.   MRN: [id number]   Reason for office visit Followup for valvular heart disease and atrial fibrillation  Alice Ballard is a 72 year old woman with a mechanical aortic and mitral valve prostheses as well as recurrent persistent atrial fibrillation, hypertension, dyslipidemia. She has no history of coronary artery disease. She has a dual-chamber permanent pacemaker that was implanted in 2011 for tachycardia-bradycardia syndrome. She is on chronic warfarin anticoagulation both for her mechanical valves and 40 atrial fibrillation.  Since her last, she has not had any episodes of dizziness or syncope. Has not had focal neurological events or other signs of cardioembolic problems. She denies serious bleeding issues but has 2-3 episodes of epistaxis weekly. This does not appear to be any worse and has been in the past and she can always stop the epistaxis with local pressure. She has not had any recurrence of hematuria which was associated with a urinary tract infection last year.  She had developed progressive weight loss but this has stabilized and she has actually gained back about 7 pounds since her last appointment.   She is reasonably active and takes care of housework without complaints of shortness of breath dizziness or chest pain on exertion she denies lower extremity edema  She is unaware of arrhythmia but today she is in atrial fibrillation with 100% ventricular pacing. Since her last device check have been 11 episodes of mode switch and 2 episodes of persistent atrial for relation. One occurred in late February and lasted for a couple of weeks; the other is ongoing, now for about 6 days. Ventricular rate control is fair during episodes of atrial fibrillation with an average ventricular rate of 75 beats per minutes, although occasionally ventricular rates increased approximately and 20 beats per minute.  The overall burden of atrial  fibrillation is 28% due to these long episodes. There is 86% atrial pacing and only 24% ventricular paced.   Allergies  Allergen Reactions  . Tegretol (Carbamazepine) Other (See Comments)    REACTION: Headaches  . Shellfish Allergy Rash    Current Outpatient Prescriptions  Medication Sig Dispense Refill  . acetaminophen (TYLENOL) 500 MG tablet Take 1,000 mg by mouth every 6 (six) hours as needed. For pain      . amiodarone (PACERONE) 200 MG tablet Take 200 mg by mouth daily.       . Ascorbic Acid (VITAMIN C) 1000 MG tablet Take 1,000 mg by mouth daily.      [name] atorvastatin (LIPITOR) 80 MG tablet Take 40 mg by mouth at bedtime.      . Cholecalciferol (VITAMIN D3) 2000 UNITS capsule Take 2,000 Units by mouth daily.      . ferrous sulfate 325 (65 FE) MG tablet Take 1 tablet (325 mg total) by mouth daily.  30 tablet  3  . folic acid (FOLVITE) 1 MG tablet Take 1 tablet (1 mg total) by mouth daily.  30 tablet  5  . hydrochlorothiazide (HYDRODIURIL) 25 MG tablet Take 25 mg by mouth daily.      [name] levETIRAcetam (KEPPRA) 500 MG tablet Take 500 mg by mouth 2 (two) times daily.      [name] lisinopril (PRINIVIL,ZESTRIL) 10 MG tablet Take 3 tablets (30 mg total) by mouth daily.  30 tablet  11  . omeprazole (PRILOSEC) 20 MG capsule Take 20 mg by mouth daily.      Bertram Gala Glycol-Propyl Glycol (SYSTANE OP) Place 1 drop into both  eyes daily as needed. For dry eyes      . warfarin (COUMADIN) 5 MG tablet Take 2.5-5 mg by mouth See admin instructions. Take 2.5mg  on Mon, Wed & Fri & 5mg  on all other days      . warfarin (COUMADIN) 7.5 MG tablet Take 7.5 mg by mouth daily. [facility] Regimen given at [facility]      . alendronate (FOSAMAX) 70 MG tablet Take 70 mg by mouth every 7 (seven) days. Take with a full glass of water on an empty stomach. Takes on Sunday.      . pantoprazole (PROTONIX) 40 MG tablet Take 40 mg by mouth daily as needed. For heartburn       No current facility-administered medications  for this visit.    Past Medical History  Diagnosis Date  . UTI (urinary tract infection)   . Sick sinus syndrome   . Paroxysmal atrial fibrillation   . Hypertension   . Seizure disorder   . Coronary artery disease   . Hyperlipidemia   . Cerebral vascular disease   . GERD (gastroesophageal reflux disease)   . Tubular adenoma of colon 11/2010  . Hemorrhoids   . Hiatal hernia   . Fatty liver   . Spinal stenosis   . Gastric polyp   . Pacemaker july 2011    medtronic adapta model # ADDRL 1,SERIAL #[id number]    Past Surgical History  Procedure Laterality Date  . Pacemaker placement  05/2010  . Aortic and mitral valve replacement  1994    19mm St. Jude  . Mitral valve replacement  1986    St Jude prosthesis   . Cardiac catheterization  04/2010  . Back surgery    . Tee without cardioversion  07/11/2012    Procedure: TRANSESOPHAGEAL ECHOCARDIOGRAM (TEE);  Surgeon: Thurmon Fair, MD;  Location: [facility] ENDOSCOPY;  Service: Cardiovascular;  Laterality: N/A;  . Doppler echocardiography  sept 06, 2013    aotric valve grad. peak 51 mmHg and mean 28 mm Hg  dimensional subtractive index 0.26; mitral valve grad peak 25 and mean 7mm Hg norm pressure halftime .  [name] Nm myocar perf wall motion  03/31/2008    EF 69% low risk  . Nm myocar perf wall motion  04/19/2006    EF 69%    Family History  Problem Relation Age of Onset  . Colon cancer Sister   . Colon polyps Sister   . Heart disease Mother     History   Social History  . Marital Status: Married    Spouse Name: N/A    Number of Children: N/A  . Years of Education: N/A   Occupational History  . DISABLED    Social History Main Topics  . Smoking status: Never Smoker   . Smokeless tobacco: Never Used  . Alcohol Use: No  . Drug Use: No  . Sexually Active: Not on file   Other Topics Concern  . Not on file   Social History Narrative  . No narrative on file    Review of systems: The patient specifically denies any chest  pain at rest exertion, dyspnea at rest or with exertion, orthopnea, paroxysmal nocturnal dyspnea, syncope, palpitations, focal neurological deficits, intermittent claudication, lower extremity edema, unexplained weight gain, cough, hemoptysis or wheezing. She has epistaxis as described above. She has easy bruising upon injury. She denies abdominal pain dysphagia nausea vomiting diarrhea constipation hematuria dysuria posterior plates or frequency urgency intolerance to heat or cold mood swings allergic response  fever chills visual or auditory changes  PHYSICAL EXAM BP 140/80  Pulse 74  Ht 5\' 6"  (1.676 m)  Wt 127 lb 3.2 oz (57.698 kg)  BMI 20.54 kg/m2  General: Alert, oriented x3, no distress Head: no evidence of trauma, PERRL, EOMI, no exophtalmos or lid lag, no myxedema, no xanthelasma; normal ears, nose and oropharynx Neck: normal jugular venous pulsations and no hepatojugular reflux; brisk carotid pulses without delay and no carotid bruits Chest: clear to auscultation, no signs of consolidation by percussion or palpation, normal fremitus, symmetrical and full respiratory excursions; well-healed sternotomy scar; healthy appearing left subclavian pacemaker site Cardiovascular: normal position and quality of the apical impulse, regular rhythm, normal first and second heart sounds, sharp prosthetic valve clicks, grade 2/6 early peaking systolic ejection murmur up and down the left upper sternal border in the aortic focus, rubs or gallops Abdomen: no tenderness or distention, no masses by palpation, no abnormal pulsatility or arterial bruits, normal bowel sounds, no hepatosplenomegaly Extremities: no clubbing, cyanosis or edema; 2+ radial, ulnar and brachial pulses bilaterally; 2+ right femoral, posterior tibial and dorsalis pedis pulses; 2+ left femoral, posterior tibial and dorsalis pedis pulses; no subclavian or femoral bruits Neurological: grossly nonfocal   EKG: Atrial fibrillation and 100%  ventricular paced rhythm   BMET    Component Value Date/Time   NA 141 07/06/2012 0445   K 4.0 07/06/2012 0445   CL 106 07/06/2012 0445   CO2 27 07/06/2012 0445   GLUCOSE 98 07/06/2012 0445   BUN 23 07/06/2012 0445   CREATININE 1.57* 07/06/2012 0445   CALCIUM 9.5 07/06/2012 0445   GFRNONAA 33* 07/06/2012 0445   GFRAA 38* 07/06/2012 0445     ASSESSMENT AND PLAN Epistaxis, recurrent This is always worse during the wintertime as expected, but still occurs 2-3 times a week in the spring. She wonders whether it could be blood pressure related. The bleeding always occurs from her left nostril and she is able to stop it with local pressure.   HTN (hypertension) At home her blood pressure often exceeds 140 mm Hg. Increase the lisinopril to 30 mg daily.  CKD (chronic kidney disease), stage III Baseline creatinine 1.4-1.5  Pacemaker: Medtronic 2011 for tachy brady AFIB Normal pacemaker function. Please see separate note.  Atrial fibrillation She is unaware of the arrhythmia from a symptomatic standpoint. She has had recurrent persistent episodes of atrial fibrillation sometimes lasting more than a week. Ventricular rate control is adequate. She is on warfarin anticoagulations for prostatic aortic and mitral valve as well as the atrial fibrillation itself. She has never had a cardioembolic event that we are aware of. No changes in nature current medications. Importance to review liver function tests and thyroid function studies at least twice a year.  Hx of mechanical aortic valve replacement She has a rather small (19 mm) mechanical aortic valve prosthesis that was placed in 1994. She had a syncopal event about a year ago concern was raised about possible valve dysfunction, as the gradients were elevated. Evaluation fluoroscopy and TEE did not show evidence of any abnormalities in valve motion. By her most recent echocardiogram gradients are a little high with a peak of 51 and a mean of 28 mm Hg. It is likely  that the gradients are elevated due to some degree of prosthetic valve mismatch. She has not had any new syncopal events. Also there is no evidence of significant left ventricular hypertrophy that would support valve dysfunction with stenosis.  Mitral valve replaced 1994  St. Jude 29 mm mechanical mitral valve prosthesis placed in 1986 with normal function by echo most recently performed in September of 2013 (mean gradient 7 mm Hg, pressure halftime 44 ms). As the gradients are moderately elevated it would be beneficial to maintain sinus rhythm as long as possible and especially important to avoid rapid ventricular rates.  Hyperlipidemia Lipid profile performed less than a year ago showed a focus of 135 was resonant 83 HDL 61 LDL of 57 all within the target range. Repeat lipid profile, cmet, thyroid profile in 3 months which returns for pacemaker check.     Junious Silk, MD, [facility] and [facility] [phone] office [phone] pager

## 2013-04-06 NOTE — Assessment & Plan Note (Signed)
Normal pacemaker function. Please see separate note.

## 2013-04-06 NOTE — Assessment & Plan Note (Signed)
Baseline creatinine 1.4-1.5

## 2013-04-06 NOTE — Assessment & Plan Note (Addendum)
She is unaware of the arrhythmia from a symptomatic standpoint. She has had recurrent persistent episodes of atrial fibrillation sometimes lasting more than a week. Ventricular rate control is adequate. She is on warfarin anticoagulations for prostatic aortic and mitral valve as well as the atrial fibrillation itself. She has never had a cardioembolic event that we are aware of. No changes in nature current medications. Importance to review liver function tests and thyroid function studies at least twice a year.

## 2013-04-06 NOTE — Progress Notes (Deleted)
In clinic pacemaker interrogation. Normal device function.

## 2013-04-08 LAB — PACEMAKER DEVICE OBSERVATION
AL IMPEDENCE PM: 467 Ohm
BRDY-0003RV: 120 {beats}/min
BRDY-0004RV: 120 {beats}/min
RV LEAD IMPEDENCE PM: 573 Ohm
VENTRICULAR PACING PM: 23.8

## 2013-05-04 ENCOUNTER — Ambulatory Visit (INDEPENDENT_AMBULATORY_CARE_PROVIDER_SITE_OTHER): Payer: Medicare Other | Admitting: Pharmacist Clinician (PhC)/ Clinical Pharmacy Specialist

## 2013-05-04 VITALS — BP 156/86 | HR 84

## 2013-05-04 DIAGNOSIS — Z954 Presence of other heart-valve replacement: Secondary | ICD-10-CM

## 2013-05-04 DIAGNOSIS — Z952 Presence of prosthetic heart valve: Secondary | ICD-10-CM

## 2013-05-04 DIAGNOSIS — Z7901 Long term (current) use of anticoagulants: Secondary | ICD-10-CM

## 2013-05-04 DIAGNOSIS — I4891 Unspecified atrial fibrillation: Secondary | ICD-10-CM

## 2013-06-02 ENCOUNTER — Ambulatory Visit (INDEPENDENT_AMBULATORY_CARE_PROVIDER_SITE_OTHER): Payer: Medicare Other | Admitting: Pharmacist Clinician (PhC)/ Clinical Pharmacy Specialist

## 2013-06-02 DIAGNOSIS — I4891 Unspecified atrial fibrillation: Secondary | ICD-10-CM

## 2013-06-02 DIAGNOSIS — Z7901 Long term (current) use of anticoagulants: Secondary | ICD-10-CM

## 2013-06-02 DIAGNOSIS — Z954 Presence of other heart-valve replacement: Secondary | ICD-10-CM

## 2013-06-02 DIAGNOSIS — Z952 Presence of prosthetic heart valve: Secondary | ICD-10-CM

## 2013-06-02 LAB — POCT INR: INR: 2.2

## 2013-06-02 MED ORDER — LISINOPRIL 10 MG PO TABS: 30.0000 mg | ORAL_TABLET | Freq: Every day | ORAL | Status: DC

## 2013-06-09 ENCOUNTER — Telehealth: Payer: Self-pay | Admitting: Neurology

## 2013-06-09 NOTE — Telephone Encounter (Signed)
Transfer of Care Dr. Sandria Manly needs to be reassigned per Dr. Frances Furbish

## 2013-06-10 NOTE — Telephone Encounter (Signed)
Assigned to Dr. Penumalli. 

## 2013-06-30 ENCOUNTER — Ambulatory Visit (INDEPENDENT_AMBULATORY_CARE_PROVIDER_SITE_OTHER): Payer: Medicare Other | Admitting: Pharmacist Clinician (PhC)/ Clinical Pharmacy Specialist

## 2013-06-30 VITALS — BP 136/70 | HR 80

## 2013-06-30 DIAGNOSIS — I4891 Unspecified atrial fibrillation: Secondary | ICD-10-CM

## 2013-06-30 DIAGNOSIS — Z7901 Long term (current) use of anticoagulants: Secondary | ICD-10-CM

## 2013-06-30 DIAGNOSIS — Z954 Presence of other heart-valve replacement: Secondary | ICD-10-CM

## 2013-06-30 DIAGNOSIS — Z952 Presence of prosthetic heart valve: Secondary | ICD-10-CM

## 2013-06-30 LAB — POCT INR: INR: 2.1

## 2013-07-16 ENCOUNTER — Ambulatory Visit: Payer: Self-pay | Admitting: Neurology

## 2013-07-28 ENCOUNTER — Ambulatory Visit (INDEPENDENT_AMBULATORY_CARE_PROVIDER_SITE_OTHER): Payer: Medicare Other | Admitting: Pharmacist Clinician (PhC)/ Clinical Pharmacy Specialist

## 2013-07-28 VITALS — BP 144/78 | HR 84

## 2013-07-28 DIAGNOSIS — I4891 Unspecified atrial fibrillation: Secondary | ICD-10-CM

## 2013-07-28 DIAGNOSIS — Z7901 Long term (current) use of anticoagulants: Secondary | ICD-10-CM

## 2013-07-28 DIAGNOSIS — Z952 Presence of prosthetic heart valve: Secondary | ICD-10-CM

## 2013-07-28 DIAGNOSIS — Z954 Presence of other heart-valve replacement: Secondary | ICD-10-CM

## 2013-07-28 LAB — POCT INR: INR: 2.4

## 2013-07-28 MED ORDER — HYDROCHLOROTHIAZIDE 25 MG PO TABS: 25.0000 mg | ORAL_TABLET | Freq: Every day | ORAL | Status: DC

## 2013-07-30 ENCOUNTER — Ambulatory Visit: Payer: Self-pay | Admitting: Diagnostic Neuroimaging

## 2013-08-25 ENCOUNTER — Ambulatory Visit (INDEPENDENT_AMBULATORY_CARE_PROVIDER_SITE_OTHER): Payer: Medicare Other | Admitting: Pharmacist Clinician (PhC)/ Clinical Pharmacy Specialist

## 2013-08-25 ENCOUNTER — Encounter: Payer: Self-pay | Admitting: Cardiovascular Disease

## 2013-08-25 VITALS — BP 140/80

## 2013-08-25 DIAGNOSIS — I4891 Unspecified atrial fibrillation: Secondary | ICD-10-CM

## 2013-08-25 DIAGNOSIS — Z952 Presence of prosthetic heart valve: Secondary | ICD-10-CM

## 2013-08-25 DIAGNOSIS — Z7901 Long term (current) use of anticoagulants: Secondary | ICD-10-CM

## 2013-08-25 DIAGNOSIS — Z954 Presence of other heart-valve replacement: Secondary | ICD-10-CM

## 2013-08-25 MED ORDER — WARFARIN SODIUM 2.5 MG PO TABS: ORAL_TABLET | ORAL | Status: DC

## 2013-09-07 ENCOUNTER — Other Ambulatory Visit: Payer: Self-pay | Admitting: *Deleted

## 2013-09-07 DIAGNOSIS — R7989 Other specified abnormal findings of blood chemistry: Secondary | ICD-10-CM

## 2013-09-07 NOTE — Progress Notes (Signed)
Quick Note:  Spoke with patient and spouse in reference to her labwork. Dr. Landry Dyke instructions given.(patient sees Dr. Royann Shivers). Lab order put into system to have blood drawn on Friday. Patient lives in [place] and husband has appointment the end of this week, and request To have this done then due to where they live. ______

## 2013-09-11 ENCOUNTER — Other Ambulatory Visit: Payer: Self-pay | Admitting: *Deleted

## 2013-09-11 DIAGNOSIS — R7989 Other specified abnormal findings of blood chemistry: Secondary | ICD-10-CM

## 2013-09-11 LAB — HEPATIC FUNCTION PANEL
ALT: 38 U/L — ABNORMAL HIGH (ref 0–35)
Alkaline Phosphatase: 121 U/L — ABNORMAL HIGH (ref 39–117)
Indirect Bilirubin: 0.5 mg/dL (ref 0.0–0.9)
Total Protein: 5.9 g/dL — ABNORMAL LOW (ref 6.0–8.3)

## 2013-09-14 ENCOUNTER — Telehealth: Payer: Self-pay | Admitting: *Deleted

## 2013-09-14 DIAGNOSIS — Z79899 Other long term (current) drug therapy: Secondary | ICD-10-CM

## 2013-09-14 NOTE — Telephone Encounter (Signed)
Lab order mailed to patient.

## 2013-09-14 NOTE — Telephone Encounter (Signed)
Message copied by Vita Barley on Mon Sep 14, 2013 11:25 AM ------      Message from: Thurmon Fair      Created: Sat Sep 12, 2013  8:05 AM       Liver tests are mildly abnormal - not dangerous levels, but need to temporarily stop both the amiodarone and the lipitor (atorvastatin) and recheck in a month. If they come back to normal, will to reintroduce those medications one at a time. ------

## 2013-09-22 ENCOUNTER — Ambulatory Visit (INDEPENDENT_AMBULATORY_CARE_PROVIDER_SITE_OTHER): Payer: Medicare Other | Admitting: Pharmacist Clinician (PhC)/ Clinical Pharmacy Specialist

## 2013-09-22 VITALS — BP 150/80 | HR 84

## 2013-09-22 DIAGNOSIS — Z954 Presence of other heart-valve replacement: Secondary | ICD-10-CM

## 2013-09-22 DIAGNOSIS — Z952 Presence of prosthetic heart valve: Secondary | ICD-10-CM

## 2013-09-22 DIAGNOSIS — I4891 Unspecified atrial fibrillation: Secondary | ICD-10-CM

## 2013-09-22 DIAGNOSIS — Z7901 Long term (current) use of anticoagulants: Secondary | ICD-10-CM

## 2013-09-24 NOTE — Progress Notes (Signed)
This lab has been taken care of by Ronnell Guadalajara, CMA. This is a patient of Dr. Erin Hearing. The lab was scanned into the wrong doctor's inbox.

## 2013-10-20 ENCOUNTER — Ambulatory Visit (INDEPENDENT_AMBULATORY_CARE_PROVIDER_SITE_OTHER): Payer: Medicare Other | Admitting: Pharmacist Clinician (PhC)/ Clinical Pharmacy Specialist

## 2013-10-20 VITALS — BP 146/80 | HR 84

## 2013-10-20 DIAGNOSIS — Z954 Presence of other heart-valve replacement: Secondary | ICD-10-CM

## 2013-10-20 DIAGNOSIS — Z7901 Long term (current) use of anticoagulants: Secondary | ICD-10-CM

## 2013-10-20 DIAGNOSIS — I4891 Unspecified atrial fibrillation: Secondary | ICD-10-CM

## 2013-10-20 DIAGNOSIS — Z952 Presence of prosthetic heart valve: Secondary | ICD-10-CM

## 2013-11-03 ENCOUNTER — Ambulatory Visit (INDEPENDENT_AMBULATORY_CARE_PROVIDER_SITE_OTHER): Payer: Medicare Other | Admitting: Pharmacist Clinician (PhC)/ Clinical Pharmacy Specialist

## 2013-11-03 VITALS — BP 150/86 | HR 84

## 2013-11-03 DIAGNOSIS — Z952 Presence of prosthetic heart valve: Secondary | ICD-10-CM

## 2013-11-03 DIAGNOSIS — Z7901 Long term (current) use of anticoagulants: Secondary | ICD-10-CM

## 2013-11-03 DIAGNOSIS — I4891 Unspecified atrial fibrillation: Secondary | ICD-10-CM

## 2013-11-03 DIAGNOSIS — Z954 Presence of other heart-valve replacement: Secondary | ICD-10-CM

## 2013-11-03 LAB — POCT INR: INR: 1.7

## 2013-11-20 ENCOUNTER — Other Ambulatory Visit: Payer: Self-pay | Admitting: *Deleted

## 2013-11-20 MED ORDER — FOLIC ACID 1 MG PO TABS: 1.0000 mg | ORAL_TABLET | Freq: Every day | ORAL | Status: DC

## 2013-11-24 ENCOUNTER — Ambulatory Visit (INDEPENDENT_AMBULATORY_CARE_PROVIDER_SITE_OTHER): Payer: Medicare Other | Admitting: Pharmacist Clinician (PhC)/ Clinical Pharmacy Specialist

## 2013-11-24 ENCOUNTER — Telehealth: Payer: Self-pay | Admitting: Pharmacist Clinician (PhC)/ Clinical Pharmacy Specialist

## 2013-11-24 VITALS — BP 166/92 | HR 76

## 2013-11-24 DIAGNOSIS — I4891 Unspecified atrial fibrillation: Secondary | ICD-10-CM

## 2013-11-24 DIAGNOSIS — Z7901 Long term (current) use of anticoagulants: Secondary | ICD-10-CM

## 2013-11-24 DIAGNOSIS — Z954 Presence of other heart-valve replacement: Secondary | ICD-10-CM

## 2013-11-24 DIAGNOSIS — Z952 Presence of prosthetic heart valve: Secondary | ICD-10-CM

## 2013-11-24 LAB — POCT INR: INR: 1.3

## 2013-11-24 MED ORDER — ATORVASTATIN CALCIUM 80 MG PO TABS: 40.0000 mg | ORAL_TABLET | Freq: Every day | ORAL | Status: DC

## 2013-11-24 NOTE — Telephone Encounter (Signed)
Pt was in office for INR check this am.  Brought with her LFT/lipid lab report from December.  Has been off amiodarone and atorvastatin for 2 months, wants to know if Dr. Loletha Grayer would like her to restart either/both.  Dr. Loletha Grayer reviewed labs and asked that pt restart atorvastatin at this time and repeat LFT/lipids in 3 months.  Hold amiodarone for now.

## 2013-12-09 ENCOUNTER — Ambulatory Visit: Payer: Medicare Other | Admitting: Pharmacist Clinician (PhC)/ Clinical Pharmacy Specialist

## 2013-12-14 ENCOUNTER — Ambulatory Visit (INDEPENDENT_AMBULATORY_CARE_PROVIDER_SITE_OTHER): Payer: Medicare Other | Admitting: Pharmacist Clinician (PhC)/ Clinical Pharmacy Specialist

## 2013-12-14 VITALS — BP 142/80 | HR 84

## 2013-12-14 DIAGNOSIS — I4891 Unspecified atrial fibrillation: Secondary | ICD-10-CM

## 2013-12-14 DIAGNOSIS — Z954 Presence of other heart-valve replacement: Secondary | ICD-10-CM

## 2013-12-14 DIAGNOSIS — Z7901 Long term (current) use of anticoagulants: Secondary | ICD-10-CM

## 2013-12-14 DIAGNOSIS — Z952 Presence of prosthetic heart valve: Secondary | ICD-10-CM

## 2013-12-14 LAB — POCT INR: INR: 1.8

## 2014-01-04 ENCOUNTER — Ambulatory Visit (INDEPENDENT_AMBULATORY_CARE_PROVIDER_SITE_OTHER): Payer: Medicare Other | Admitting: Pharmacist Clinician (PhC)/ Clinical Pharmacy Specialist

## 2014-01-04 ENCOUNTER — Other Ambulatory Visit: Payer: Self-pay | Admitting: Pharmacist Clinician (PhC)/ Clinical Pharmacy Specialist

## 2014-01-04 VITALS — BP 128/70 | HR 84

## 2014-01-04 DIAGNOSIS — Z952 Presence of prosthetic heart valve: Secondary | ICD-10-CM

## 2014-01-04 DIAGNOSIS — I4891 Unspecified atrial fibrillation: Secondary | ICD-10-CM

## 2014-01-04 DIAGNOSIS — Z954 Presence of other heart-valve replacement: Secondary | ICD-10-CM

## 2014-01-04 DIAGNOSIS — Z7901 Long term (current) use of anticoagulants: Secondary | ICD-10-CM

## 2014-01-04 LAB — POCT INR: INR: 2

## 2014-01-04 MED ORDER — ATORVASTATIN CALCIUM 80 MG PO TABS: 40.0000 mg | ORAL_TABLET | Freq: Every day | ORAL | Status: DC

## 2014-01-04 MED ORDER — WARFARIN SODIUM 2.5 MG PO TABS
ORAL_TABLET | ORAL | Status: DC
Start: 2014-01-04 — End: 2014-04-26

## 2014-01-04 MED ORDER — LISINOPRIL 10 MG PO TABS: 30.0000 mg | ORAL_TABLET | Freq: Every day | ORAL | Status: DC

## 2014-01-04 MED ORDER — HYDROCHLOROTHIAZIDE 25 MG PO TABS: 25.0000 mg | ORAL_TABLET | Freq: Every day | ORAL | Status: DC

## 2014-01-04 MED ORDER — FOLIC ACID 1 MG PO TABS
1.0000 mg | ORAL_TABLET | Freq: Every day | ORAL | Status: DC
Start: 2014-01-04 — End: 2014-06-07

## 2014-01-04 MED ORDER — AMIODARONE HCL 200 MG PO TABS: 200.0000 mg | ORAL_TABLET | Freq: Every day | ORAL | Status: DC

## 2014-02-01 ENCOUNTER — Ambulatory Visit (INDEPENDENT_AMBULATORY_CARE_PROVIDER_SITE_OTHER): Payer: Medicare Other | Admitting: Pharmacist Clinician (PhC)/ Clinical Pharmacy Specialist

## 2014-02-01 VITALS — BP 130/66 | HR 72

## 2014-02-01 DIAGNOSIS — Z7901 Long term (current) use of anticoagulants: Secondary | ICD-10-CM

## 2014-02-01 DIAGNOSIS — Z952 Presence of prosthetic heart valve: Secondary | ICD-10-CM

## 2014-02-01 DIAGNOSIS — Z954 Presence of other heart-valve replacement: Secondary | ICD-10-CM

## 2014-02-01 DIAGNOSIS — I4891 Unspecified atrial fibrillation: Secondary | ICD-10-CM

## 2014-02-01 LAB — POCT INR: INR: 1.7

## 2014-03-01 ENCOUNTER — Ambulatory Visit: Payer: Medicare Other | Admitting: Pharmacist Clinician (PhC)/ Clinical Pharmacy Specialist

## 2014-03-03 ENCOUNTER — Ambulatory Visit (INDEPENDENT_AMBULATORY_CARE_PROVIDER_SITE_OTHER): Payer: Medicare Other | Admitting: Pharmacist Clinician (PhC)/ Clinical Pharmacy Specialist

## 2014-03-03 VITALS — BP 150/84 | HR 84

## 2014-03-03 DIAGNOSIS — Z7901 Long term (current) use of anticoagulants: Secondary | ICD-10-CM

## 2014-03-03 DIAGNOSIS — Z954 Presence of other heart-valve replacement: Secondary | ICD-10-CM

## 2014-03-03 DIAGNOSIS — Z952 Presence of prosthetic heart valve: Secondary | ICD-10-CM

## 2014-03-03 DIAGNOSIS — I4891 Unspecified atrial fibrillation: Secondary | ICD-10-CM

## 2014-03-03 LAB — POCT INR: INR: 1.5

## 2014-03-17 ENCOUNTER — Ambulatory Visit (INDEPENDENT_AMBULATORY_CARE_PROVIDER_SITE_OTHER): Payer: Medicare Other | Admitting: Pharmacist Clinician (PhC)/ Clinical Pharmacy Specialist

## 2014-03-17 DIAGNOSIS — Z952 Presence of prosthetic heart valve: Secondary | ICD-10-CM

## 2014-03-17 DIAGNOSIS — Z7901 Long term (current) use of anticoagulants: Secondary | ICD-10-CM

## 2014-03-17 DIAGNOSIS — Z954 Presence of other heart-valve replacement: Secondary | ICD-10-CM

## 2014-03-17 DIAGNOSIS — I4891 Unspecified atrial fibrillation: Secondary | ICD-10-CM

## 2014-03-17 LAB — POCT INR: INR: 1.3

## 2014-03-18 ENCOUNTER — Telehealth: Payer: Self-pay | Admitting: Cardiovascular Disease

## 2014-03-18 MED ORDER — LISINOPRIL 30 MG PO TABS: 30.0000 mg | ORAL_TABLET | Freq: Every day | ORAL | Status: DC

## 2014-03-18 NOTE — Telephone Encounter (Signed)
Pharmacy will switch Lisinopril to 30mg  tablet and patient will take one daily instead of 3 10mg  tablets (insurance will not cover).   Patient notified of this and voiced understanding.

## 2014-03-18 NOTE — Telephone Encounter (Signed)
Calling regarding dosage of lisinopril 10 mg   They have questions about this  Insurance will not cover 3 per day.  Is there a way to re dose  Please call.

## 2014-04-02 ENCOUNTER — Ambulatory Visit (INDEPENDENT_AMBULATORY_CARE_PROVIDER_SITE_OTHER): Payer: Medicare Other | Admitting: Pharmacist Clinician (PhC)/ Clinical Pharmacy Specialist

## 2014-04-02 DIAGNOSIS — Z7901 Long term (current) use of anticoagulants: Secondary | ICD-10-CM

## 2014-04-02 DIAGNOSIS — I4891 Unspecified atrial fibrillation: Secondary | ICD-10-CM

## 2014-04-02 DIAGNOSIS — Z954 Presence of other heart-valve replacement: Secondary | ICD-10-CM

## 2014-04-02 DIAGNOSIS — Z952 Presence of prosthetic heart valve: Secondary | ICD-10-CM

## 2014-04-02 LAB — POCT INR: INR: 1.8

## 2014-04-22 ENCOUNTER — Encounter: Payer: Self-pay | Admitting: Cardiovascular Disease

## 2014-04-22 ENCOUNTER — Telehealth: Payer: Self-pay | Admitting: Cardiovascular Disease

## 2014-04-23 ENCOUNTER — Ambulatory Visit (INDEPENDENT_AMBULATORY_CARE_PROVIDER_SITE_OTHER): Payer: Medicare Other | Admitting: Pharmacist Clinician (PhC)/ Clinical Pharmacy Specialist

## 2014-04-23 VITALS — BP 128/72 | HR 72

## 2014-04-23 DIAGNOSIS — Z954 Presence of other heart-valve replacement: Secondary | ICD-10-CM

## 2014-04-23 DIAGNOSIS — Z7901 Long term (current) use of anticoagulants: Secondary | ICD-10-CM

## 2014-04-23 DIAGNOSIS — I4891 Unspecified atrial fibrillation: Secondary | ICD-10-CM

## 2014-04-23 DIAGNOSIS — Z952 Presence of prosthetic heart valve: Secondary | ICD-10-CM

## 2014-04-23 LAB — POCT INR: INR: 2.3

## 2014-04-23 NOTE — Telephone Encounter (Signed)
Closed encounter °

## 2014-04-26 ENCOUNTER — Other Ambulatory Visit: Payer: Self-pay | Admitting: Pharmacist Clinician (PhC)/ Clinical Pharmacy Specialist

## 2014-04-26 MED ORDER — WARFARIN SODIUM 2.5 MG PO TABS: ORAL_TABLET | ORAL | Status: DC

## 2014-05-17 ENCOUNTER — Ambulatory Visit (INDEPENDENT_AMBULATORY_CARE_PROVIDER_SITE_OTHER): Payer: Medicare Other | Admitting: Pharmacist

## 2014-05-17 DIAGNOSIS — Z7901 Long term (current) use of anticoagulants: Secondary | ICD-10-CM

## 2014-05-17 DIAGNOSIS — Z954 Presence of other heart-valve replacement: Secondary | ICD-10-CM

## 2014-05-17 DIAGNOSIS — I4891 Unspecified atrial fibrillation: Secondary | ICD-10-CM

## 2014-05-17 DIAGNOSIS — Z952 Presence of prosthetic heart valve: Secondary | ICD-10-CM

## 2014-05-17 LAB — POCT INR: INR: 2

## 2014-05-28 ENCOUNTER — Encounter: Payer: Medicare Other | Admitting: Cardiovascular Disease

## 2014-06-07 ENCOUNTER — Other Ambulatory Visit: Payer: Self-pay | Admitting: *Deleted

## 2014-06-07 MED ORDER — ATORVASTATIN CALCIUM 80 MG PO TABS: 40.0000 mg | ORAL_TABLET | Freq: Every day | ORAL | Status: DC

## 2014-06-07 MED ORDER — FOLIC ACID 1 MG PO TABS: 1.0000 mg | ORAL_TABLET | Freq: Every day | ORAL | Status: DC

## 2014-06-22 ENCOUNTER — Ambulatory Visit (INDEPENDENT_AMBULATORY_CARE_PROVIDER_SITE_OTHER): Payer: Medicare Other | Admitting: *Deleted

## 2014-06-22 ENCOUNTER — Ambulatory Visit (INDEPENDENT_AMBULATORY_CARE_PROVIDER_SITE_OTHER): Payer: Medicare Other | Admitting: Cardiovascular Disease

## 2014-06-22 ENCOUNTER — Encounter: Payer: Self-pay | Admitting: Cardiovascular Disease

## 2014-06-22 VITALS — BP 122/70 | HR 70 | Resp 16 | Ht 63.0 in | Wt 140.8 lb

## 2014-06-22 DIAGNOSIS — Z954 Presence of other heart-valve replacement: Secondary | ICD-10-CM

## 2014-06-22 DIAGNOSIS — Z79899 Other long term (current) drug therapy: Secondary | ICD-10-CM

## 2014-06-22 DIAGNOSIS — R5381 Other malaise: Secondary | ICD-10-CM

## 2014-06-22 DIAGNOSIS — Z950 Presence of cardiac pacemaker: Secondary | ICD-10-CM

## 2014-06-22 DIAGNOSIS — I4819 Other persistent atrial fibrillation: Secondary | ICD-10-CM

## 2014-06-22 DIAGNOSIS — R0602 Shortness of breath: Secondary | ICD-10-CM

## 2014-06-22 DIAGNOSIS — I4891 Unspecified atrial fibrillation: Secondary | ICD-10-CM

## 2014-06-22 DIAGNOSIS — Z7901 Long term (current) use of anticoagulants: Secondary | ICD-10-CM

## 2014-06-22 DIAGNOSIS — Z952 Presence of prosthetic heart valve: Secondary | ICD-10-CM

## 2014-06-22 DIAGNOSIS — R5383 Other fatigue: Secondary | ICD-10-CM

## 2014-06-22 LAB — CBC
HCT: 31.6 % — ABNORMAL LOW (ref 36.0–46.0)
Hemoglobin: 10.6 g/dL — ABNORMAL LOW (ref 12.0–15.0)
MCH: 29.5 pg (ref 26.0–34.0)
MCHC: 33.5 g/dL (ref 30.0–36.0)
MCV: 88 fL (ref 78.0–100.0)
Platelets: 102 K/uL — ABNORMAL LOW (ref 150–400)
RBC: 3.59 MIL/uL — ABNORMAL LOW (ref 3.87–5.11)
RDW: 13.6 % (ref 11.5–15.5)
WBC: 3.8 K/uL — ABNORMAL LOW (ref 4.0–10.5)

## 2014-06-22 LAB — MDC_IDC_ENUM_SESS_TYPE_INCLINIC
Battery Impedance: 231 Ohm
Battery Remaining Longevity: 115 mo
Battery Voltage: 2.79 V
Brady Statistic AP VP Percent: 12 %
Brady Statistic AP VS Percent: 50 %
Brady Statistic AS VP Percent: 34 %
Brady Statistic AS VS Percent: 4 %
Date Time Interrogation Session: 20150818085859
Lead Channel Impedance Value: 461 Ohm
Lead Channel Impedance Value: 508 Ohm
Lead Channel Pacing Threshold Amplitude: 0.5 V
Lead Channel Pacing Threshold Amplitude: 1.25 V
Lead Channel Pacing Threshold Pulse Width: 0.4 ms
Lead Channel Pacing Threshold Pulse Width: 0.4 ms
Lead Channel Sensing Intrinsic Amplitude: 1.4 mV
Lead Channel Sensing Intrinsic Amplitude: 8 mV
Lead Channel Setting Pacing Amplitude: 1.5 V
Lead Channel Setting Pacing Amplitude: 2 V
Lead Channel Setting Pacing Pulse Width: 0.4 ms
Lead Channel Setting Sensing Sensitivity: 2.8 mV

## 2014-06-22 LAB — COMPREHENSIVE METABOLIC PANEL
ALT: 12 U/L (ref 0–35)
AST: 29 U/L (ref 0–37)
Albumin: 3.6 g/dL (ref 3.5–5.2)
Alkaline Phosphatase: 84 U/L (ref 39–117)
BILIRUBIN TOTAL: 0.6 mg/dL (ref 0.2–1.2)
BUN: 16 mg/dL (ref 6–23)
CO2: 27 mEq/L (ref 19–32)
CREATININE: 1.06 mg/dL (ref 0.50–1.10)
Calcium: 9 mg/dL (ref 8.4–10.5)
Chloride: 107 mEq/L (ref 96–112)
Glucose, Bld: 72 mg/dL (ref 70–99)
Potassium: 4.5 mEq/L (ref 3.5–5.3)
SODIUM: 142 meq/L (ref 135–145)
TOTAL PROTEIN: 6.4 g/dL (ref 6.0–8.3)

## 2014-06-22 LAB — TSH: TSH: 2.481 u[IU]/mL (ref 0.350–4.500)

## 2014-06-22 LAB — POCT INR: INR: 2.7

## 2014-06-22 NOTE — Patient Instructions (Addendum)
  Your physician recommends that you return for lab work in: [employer].  Your physician has requested that you have an echocardiogram. Echocardiography is a painless test that uses sound waves to create images of your heart. It provides your doctor with information about the size and shape of your heart and how well your heart's chambers and valves are working. This procedure takes approximately one hour. There are no restrictions for this procedure.  Dr. Sallyanne Kuster recommends that you schedule a follow-up appointment in:  3 months with pacemaker check.  If your lab tests and Echo are stable Dr. Loletha Grayer wants you to have a sleep study.

## 2014-06-24 ENCOUNTER — Encounter: Payer: Self-pay | Admitting: Cardiovascular Disease

## 2014-06-24 NOTE — Progress Notes (Signed)
Patient ID: Alice Ballard, female   DOB: [birth year], 73 y.o.   MRN: [id number]     Reason for office visit Valvular heart disease (status post aortic valve replacement and mitral valve replacement Pacemaker followup  Alice Ballard is a 73 year old woman with a mechanical aortic and mitral valve prostheses as well as recurrent persistent atrial fibrillation, hypertension, dyslipidemia. In 1986 for mitral valve was replaced with a 29 mm St. Jude prosthesis for rheumatic disease. In 1994 her aortic valve was replaced with a 19 mm St. Jude device. Has chronic moderate elevation in the valvular gradients across the aortic valve, likely an expression of patient's valve mismatch. Fluoroscopy has shown normal disc motion. Previous right heart catheterization had shown only very mild pulmonary hypertension (mean PA pressure 25 mm Hg, wedge pressure 16 mm Hg) She has no history of coronary artery disease. She has a dual-chamber permanent pacemaker that was implanted in 2011 for tachycardia-bradycardia syndrome. She is on chronic warfarin anticoagulation both for her mechanical valves and for atrial fibrillation. She has not had bleeding complications or embolic events. She does not have a history of stroke.  She has not had any cardiac complaints since her last appointment but complains of fatigue and sleepiness. Her husband says that she falls asleep all the time as soon as she sits down in a chair.   She has persistent ("going on permanent") atrial fibrillation with generally well-controlled ventricular rates. She has roughly 50% ventricular sensed and 50% ventricular paced rhythm. She has been in persistent atrial fibrillation since March 30. Prior to that she had roughly one week of normal rhythm. This was preceded by atrial fibrillation for the preceding 3 months. In the past, when her episodes of atrial fibrillation with shorter, they appeared to be asymptomatic. Heart rate distribution is fair for a sedentary  individual. Lead parameters and battery voltage are normal.  She had a sleep study reportedly in the remote past (I can't find records) which was negative. There is a strong family history of obstructive sleep apnea. She has seizures that have been well controlled on Keppra. Her husband is sure that the episodes he describes are sleep and not seizures appear  Allergies  Allergen Reactions  . Tegretol [Carbamazepine] Other (See Comments)    REACTION: Headaches  . Shellfish Allergy Rash    Current Outpatient Prescriptions  Medication Sig Dispense Refill  . acetaminophen (TYLENOL) 500 MG tablet Take 1,000 mg by mouth every 6 (six) hours as needed. For pain      . alendronate (FOSAMAX) 70 MG tablet Take 70 mg by mouth every 7 (seven) days. Take with a full glass of water on an empty stomach. Takes on Sunday.      . Ascorbic Acid (VITAMIN C) 1000 MG tablet Take 1,000 mg by mouth daily.      [name] atorvastatin (LIPITOR) 80 MG tablet Take 0.5 tablets (40 mg total) by mouth at bedtime.  15 tablet  2  . Cholecalciferol (VITAMIN D3) 2000 UNITS capsule Take 2,000 Units by mouth daily.      . fluticasone (FLONASE) 50 MCG/ACT nasal spray Place 2 sprays into both nostrils 2 (two) times daily.      . folic acid (FOLVITE) 1 MG tablet Take 1 tablet (1 mg total) by mouth daily.  30 tablet  2  . hydrochlorothiazide (HYDRODIURIL) 25 MG tablet Take 1 tablet (25 mg total) by mouth daily.  30 tablet  3  . levETIRAcetam (KEPPRA) 500 MG tablet Take 500 mg  by mouth 2 (two) times daily.      [name] lisinopril (PRINIVIL,ZESTRIL) 30 MG tablet Take 1 tablet (30 mg total) by mouth daily.  30 tablet  5  . omeprazole (PRILOSEC) 20 MG capsule Take 20 mg by mouth daily.      . pantoprazole (PROTONIX) 40 MG tablet Take 40 mg by mouth daily as needed. For heartburn      . Polyethyl Glycol-Propyl Glycol (SYSTANE OP) Place 1 drop into both eyes daily as needed. For dry eyes      . warfarin (COUMADIN) 2.5 MG tablet Take 2 tablets by mouth  daily or as directed by coumadin clinic  60 tablet  5  . ferrous sulfate 325 (65 FE) MG tablet Take 1 tablet (325 mg total) by mouth daily.  30 tablet  3   No current facility-administered medications for this visit.    Past Medical History  Diagnosis Date  . UTI (urinary tract infection)   . Sick sinus syndrome   . Paroxysmal atrial fibrillation   . Hypertension   . Seizure disorder   . Coronary artery disease   . Hyperlipidemia   . Cerebral vascular disease   . GERD (gastroesophageal reflux disease)   . Tubular adenoma of colon 11/2010  . Hemorrhoids   . Hiatal hernia   . Fatty liver   . Spinal stenosis   . Gastric polyp   . Pacemaker july 2011    medtronic adapta model # ADDRL 1,SERIAL #[id number]    Past Surgical History  Procedure Laterality Date  . Pacemaker placement  05/2010  . Aortic and mitral valve replacement  1994    61mm St. Jude  . Mitral valve replacement  1986    St Jude prosthesis   . Cardiac catheterization  04/2010  . Back surgery    . Tee without cardioversion  07/11/2012    Procedure: TRANSESOPHAGEAL ECHOCARDIOGRAM (TEE);  Surgeon: Sanda Klein, MD;  Location: [facility];  Service: Cardiovascular;  Laterality: N/A;  . Doppler echocardiography  sept 06, 2013    aotric valve grad. peak 51 mmHg and mean 28 mm Hg  dimensional subtractive index 0.26; mitral valve grad peak 25 and mean 28mm Hg norm pressure halftime 27msec.  [name] Nm myocar perf wall motion  03/31/2008    EF 69% low risk  . Nm myocar perf wall motion  04/19/2006    EF 69%    Family History  Problem Relation Age of Onset  . Colon cancer Sister   . Colon polyps Sister   . Heart disease Mother     History   Social History  . Marital Status: Married    Spouse Name: N/A    Number of Children: N/A  . Years of Education: N/A   Occupational History  . DISABLED    Social History Main Topics  . Smoking status: Never Smoker   . Smokeless tobacco: Never Used  . Alcohol Use: No  . Drug  Use: No  . Sexual Activity: Not on file   Other Topics Concern  . Not on file   Social History Narrative  . No narrative on file    Review of systems: The patient specifically denies any chest pain at rest or with exertion, dyspnea at rest or with exertion, orthopnea, paroxysmal nocturnal dyspnea, syncope, palpitations, focal neurological deficits, intermittent claudication, lower extremity edema, unexplained weight gain, cough, hemoptysis or wheezing.  The patient also denies abdominal pain, nausea, vomiting, dysphagia, diarrhea, constipation, polyuria, polydipsia, dysuria, hematuria, frequency,  urgency, abnormal bleeding or bruising, fever, chills, unexpected weight changes, mood swings, change in skin or hair texture, change in voice quality, auditory or visual problems, allergic reactions or rashes, new musculoskeletal complaints other than usual "aches and pains".   PHYSICAL EXAM BP 122/70  Pulse 70  Resp 16  Ht 5\' 3"  (1.6 m)  Wt 140 lb 12.8 oz (63.866 kg)  BMI 24.95 kg/m2  General: Alert, oriented x3, no distress Head: no evidence of trauma, PERRL, EOMI, no exophtalmos or lid lag, no myxedema, no xanthelasma; normal ears, nose and oropharynx Neck: normal jugular venous pulsations and no hepatojugular reflux; brisk carotid pulses without delay and no carotid bruits Chest: clear to auscultation, no signs of consolidation by percussion or palpation, normal fremitus, symmetrical and full respiratory excursions Cardiovascular: normal position and quality of the apical impulse, regular rhythm, normal first and second heart sounds with very crisp prosthetic valve clicks, grade 2/6 systolic ejection murmur is early peaking, no diastolic murmurs, rubs or gallops Abdomen: no tenderness or distention, no masses by palpation, no abnormal pulsatility or arterial bruits, normal bowel sounds, no hepatosplenomegaly Extremities: no clubbing, cyanosis or edema; 2+ radial, ulnar and brachial pulses  bilaterally; 2+ right femoral, posterior tibial and dorsalis pedis pulses; 2+ left femoral, posterior tibial and dorsalis pedis pulses; no subclavian or femoral bruits Neurological: grossly nonfocal   EKG: Atrial fibrillation with ventricular paced rhythm  Lipid Panel     Component Value Date/Time   CHOL  Value: 144        ATP III CLASSIFICATION:  <200     mg/dL   Desirable  200-239  mg/dL   Borderline High  >=240    mg/dL   High        04/27/2010 0340   TRIG 125 04/27/2010 0340   HDL 40 04/27/2010 0340   CHOLHDL 3.6 04/27/2010 0340   VLDL 25 04/27/2010 0340   LDLCALC  Value: 79        Total Cholesterol/HDL:CHD Risk Coronary Heart Disease Risk Table                     Men   Women  1/2 Average Risk   3.4   3.3  Average Risk       5.0   4.4  2 X Average Risk   9.6   7.1  3 X Average Risk  23.4   11.0        Use the calculated Patient Ratio above and the CHD Risk Table to determine the patient's CHD Risk.        ATP III CLASSIFICATION (LDL):  <100     mg/dL   Optimal  100-129  mg/dL   Near or Above                    Optimal  130-159  mg/dL   Borderline  160-189  mg/dL   High  >190     mg/dL   Very High 04/27/2010 0340    BMET    Component Value Date/Time   NA 142 06/22/2014 0949   K 4.5 06/22/2014 0949   CL 107 06/22/2014 0949   CO2 27 06/22/2014 0949   GLUCOSE 72 06/22/2014 0949   BUN 16 06/22/2014 0949   CREATININE 1.06 06/22/2014 0949   CREATININE 1.57* 07/06/2012 0445   CALCIUM 9.0 06/22/2014 0949   GFRNONAA 33* 07/06/2012 0445   GFRAA 38* 07/06/2012 0445     ASSESSMENT AND PLAN  Mrs. Hilgert's fatigue and sleepiness is not readily explained. At least by exam valvular function appears normal and this would be an unusual presentation for prosthetic valve problems. It is not unreasonable to check an echocardiogram. We'll also look for evidence of anemia or hypothyroidism. If none of this workup reveals an etiology, she should probably have a repeat sleep study and a neurological reevaluation.  Normal  pacemaker function. No permanent reprogramming changes were made. I think she is settling into a pattern of permanent atrial fibrillation and I think attempts at cardioversion might be both acute and not helpful.  Patient Instructions   Your physician recommends that you return for lab work in: [employer].  Your physician has requested that you have an echocardiogram. Echocardiography is a painless test that uses sound waves to create images of your heart. It provides your doctor with information about the size and shape of your heart and how well your heart's chambers and valves are working. This procedure takes approximately one hour. There are no restrictions for this procedure.  Dr. Sallyanne Kuster recommends that you schedule a follow-up appointment in:  3 months with pacemaker check.  If your lab tests and Echo are stable Dr. Loletha Grayer wants you to have a sleep study.       Orders Placed This Encounter  Procedures  . Comprehensive metabolic panel  . CBC  . TSH  . Implantable device check  . EKG 12-Lead  . 2D Echocardiogram without contrast   No orders of the defined types were placed in this encounter.    Holli Humbles, MD, [place] [phone] office [phone] pager

## 2014-06-25 ENCOUNTER — Telehealth: Payer: Self-pay | Admitting: *Deleted

## 2014-06-25 DIAGNOSIS — R0683 Snoring: Secondary | ICD-10-CM

## 2014-06-25 DIAGNOSIS — R5381 Other malaise: Secondary | ICD-10-CM

## 2014-06-25 DIAGNOSIS — R5383 Other fatigue: Principal | ICD-10-CM

## 2014-06-25 NOTE — Telephone Encounter (Signed)
Message copied by Tressa Busman on Fri Jun 25, 2014  1:04 PM ------      Message from: Sanda Klein      Created: Tue Jun 22, 2014  7:08 PM       Anemia is better than before, kidney function better than before, liver tests are back to normal, thyroid function is normal. Everything looks good and no reason for her to be so tired and sleepy. I think she needs a repeat sleep study. Please order it, Pamala Hurry. ------

## 2014-06-25 NOTE — Telephone Encounter (Signed)
Patient notified of lab results.  Order placed for Sleep Study.  Patient voiced understanding that [facility] will call to schedule.

## 2014-07-19 ENCOUNTER — Other Ambulatory Visit: Payer: Self-pay

## 2014-07-19 ENCOUNTER — Encounter: Payer: Self-pay | Admitting: Cardiology

## 2014-07-19 MED ORDER — LISINOPRIL 30 MG PO TABS: 30.0000 mg | ORAL_TABLET | Freq: Every day | ORAL | Status: DC

## 2014-07-21 ENCOUNTER — Ambulatory Visit (INDEPENDENT_AMBULATORY_CARE_PROVIDER_SITE_OTHER): Payer: Medicare Other | Admitting: Pharmacist Clinician (PhC)/ Clinical Pharmacy Specialist

## 2014-07-21 ENCOUNTER — Ambulatory Visit (HOSPITAL_COMMUNITY)
Admission: RE | Admit: 2014-07-21 | Discharge: 2014-07-21 | Disposition: A | Discharge status: Home / Self Care | Payer: Medicare Other | Source: Ambulatory Visit | Attending: Cardiovascular Disease | Admitting: Cardiovascular Disease

## 2014-07-21 VITALS — BP 172/90 | HR 76

## 2014-07-21 DIAGNOSIS — I4891 Unspecified atrial fibrillation: Secondary | ICD-10-CM | POA: Insufficient documentation

## 2014-07-21 DIAGNOSIS — R0609 Other forms of dyspnea: Secondary | ICD-10-CM | POA: Insufficient documentation

## 2014-07-21 DIAGNOSIS — I359 Nonrheumatic aortic valve disorder, unspecified: Secondary | ICD-10-CM | POA: Diagnosis not present

## 2014-07-21 DIAGNOSIS — E785 Hyperlipidemia, unspecified: Secondary | ICD-10-CM | POA: Insufficient documentation

## 2014-07-21 DIAGNOSIS — R0602 Shortness of breath: Secondary | ICD-10-CM

## 2014-07-21 DIAGNOSIS — R0989 Other specified symptoms and signs involving the circulatory and respiratory systems: Secondary | ICD-10-CM | POA: Insufficient documentation

## 2014-07-21 DIAGNOSIS — I369 Nonrheumatic tricuspid valve disorder, unspecified: Secondary | ICD-10-CM

## 2014-07-21 DIAGNOSIS — Z952 Presence of prosthetic heart valve: Secondary | ICD-10-CM

## 2014-07-21 DIAGNOSIS — Z954 Presence of other heart-valve replacement: Secondary | ICD-10-CM

## 2014-07-21 DIAGNOSIS — Z7901 Long term (current) use of anticoagulants: Secondary | ICD-10-CM

## 2014-07-21 LAB — POCT INR: INR: 2.1

## 2014-07-21 NOTE — Progress Notes (Signed)
2D Echo Performed 07/21/2014    Marygrace Drought, RCS

## 2014-08-23 ENCOUNTER — Ambulatory Visit (INDEPENDENT_AMBULATORY_CARE_PROVIDER_SITE_OTHER): Payer: Medicare Other | Admitting: Pharmacist Clinician (PhC)/ Clinical Pharmacy Specialist

## 2014-08-23 ENCOUNTER — Other Ambulatory Visit: Payer: Self-pay | Admitting: Pharmacist Clinician (PhC)/ Clinical Pharmacy Specialist

## 2014-08-23 DIAGNOSIS — Z7901 Long term (current) use of anticoagulants: Secondary | ICD-10-CM

## 2014-08-23 DIAGNOSIS — Z952 Presence of prosthetic heart valve: Secondary | ICD-10-CM

## 2014-08-23 DIAGNOSIS — Z954 Presence of other heart-valve replacement: Secondary | ICD-10-CM

## 2014-08-23 DIAGNOSIS — I4891 Unspecified atrial fibrillation: Secondary | ICD-10-CM

## 2014-08-23 LAB — POCT INR: INR: 1.8

## 2014-08-23 MED ORDER — HYDROCHLOROTHIAZIDE 25 MG PO TABS: 25.0000 mg | ORAL_TABLET | Freq: Every day | ORAL | Status: DC

## 2014-08-23 MED ORDER — WARFARIN SODIUM 2.5 MG PO TABS: ORAL_TABLET | ORAL | Status: DC

## 2014-08-23 MED ORDER — LISINOPRIL 30 MG PO TABS: 30.0000 mg | ORAL_TABLET | Freq: Every day | ORAL | Status: DC

## 2014-08-23 MED ORDER — FOLIC ACID 1 MG PO TABS: 1.0000 mg | ORAL_TABLET | Freq: Every day | ORAL | Status: DC

## 2014-08-23 MED ORDER — ATORVASTATIN CALCIUM 80 MG PO TABS
40.0000 mg | ORAL_TABLET | Freq: Every day | ORAL | Status: DC
Start: 2014-08-23 — End: 2015-03-21

## 2014-08-24 ENCOUNTER — Ambulatory Visit (HOSPITAL_BASED_OUTPATIENT_CLINIC_OR_DEPARTMENT_OTHER): Payer: Medicare Other | Attending: Cardiovascular Disease

## 2014-08-24 VITALS — Ht 61.0 in | Wt 142.0 lb

## 2014-08-24 DIAGNOSIS — R5381 Other malaise: Secondary | ICD-10-CM

## 2014-08-24 DIAGNOSIS — G2581 Restless legs syndrome: Secondary | ICD-10-CM | POA: Insufficient documentation

## 2014-08-24 DIAGNOSIS — G4710 Hypersomnia, unspecified: Secondary | ICD-10-CM | POA: Insufficient documentation

## 2014-08-24 DIAGNOSIS — R5383 Other fatigue: Secondary | ICD-10-CM

## 2014-08-24 DIAGNOSIS — G4733 Obstructive sleep apnea (adult) (pediatric): Secondary | ICD-10-CM

## 2014-08-24 DIAGNOSIS — R0683 Snoring: Secondary | ICD-10-CM

## 2014-08-29 DIAGNOSIS — G4710 Hypersomnia, unspecified: Secondary | ICD-10-CM

## 2014-08-29 NOTE — Sleep Study (Signed)
   NAME: Alice Ballard DATE OF BIRTH:  1941-08-25 MEDICAL RECORD NUMBER [id number]  LOCATION: [facility]  PHYSICIAN: KELLY,THOMAS A  DATE OF STUDY: 08/24/2014  SLEEP STUDY TYPE: Nocturnal Polysomnogram               REFERRING PHYSICIAN: Croitoru, Mihai, MD  INDICATION FOR STUDY: Hypersomnolence, non-restorative sleep and snoring18  EPWORTH SLEEPINESS SCORE:  18 consistent with excessive daytime sleepiness HEIGHT: 5\' 1"  (154.9 cm)  WEIGHT: 142 lb (64.411 kg)    Body mass index is 26.84 kg/(m^2).  NECK SIZE: 13 in.  MEDICATIONS:  warfarin (COUMADIN) 2.5 MG tablet Polyethyl Glycol-Propyl Glycol (SYSTANE OP) 1 drop, Daily PRN pantoprazole (PROTONIX) 40 MG tablet 40 mg, Daily PRN omeprazole (PRILOSEC) 20 MG capsule 20 mg, Daily lisinopril (PRINIVIL,ZESTRIL) 30 MG tablet 30 mg, Daily levETIRAcetam (KEPPRA) 500 MG tablet 500 mg, 2 times daily hydrochlorothiazide (HYDRODIURIL) 25 MG tablet 25 mg, Daily folic acid (FOLVITE) 1 MG tablet 1 mg, Daily fluticasone (FLONASE) 50 MCG/ACT nasal spray 2 spray, 2 times daily ferrous sulfate 325 (65 FE) MG tablet (Expired) 325 mg, Daily Cholecalciferol (VITAMIN D3) 2000 UNITS capsule 2,000 Units, Daily atorvastatin (LIPITOR) 80 MG tablet 40 mg, Daily at bedtime Ascorbic Acid (VITAMIN C) 1000 MG tablet 1,000 mg, Daily alendronate (FOSAMAX) 70 MG tablet 70 mg, Every 7 days acetaminophen (TYLENOL) 500    SLEEP ARCHITECTURE:  The patient slept for a total sleep time of 265.5 minutes out of 379.5 minutes recording time for a sleep efficiency of 70%. Sleep-onset latency was 8.5 minutes and latency to REM sleep was normal at 90 minutes. The patient spent 1.5 minutes in Stage 1 (0.6%); 220 minutes in Stage 2 (82.9%); 23 minutes in Stage 3 (8.7%) and 21 minutes in REM sleep (7.9%). There is reduced duration of REM sleep, but otherwise, sleep architecture is normal.  RESPIRATORY DATA:  There were 0 apneas and 6 total hyponeas (2 in REM and 4 in NREM).  The Apnea Hypopnea Index (AHI) was 1.4/hr and Respiratory Disturbance Index (RDI) 1.6/hr. There was mild snoring. There is mils increase in upper airway resistance without  evidence for obstructive sleep apnea.  OXYGEN DATA:  The mean oxygen saturation in 93.8% with an oxygen nadir of 88% during NREM sleep and 89% during REM sleep. Time spent with SaO2 <90% was 1.7 minutes, or 0.5%. Time spent with SaO2 under <88% was 0 minutes  CARDIAC DATA:   The mean HR was 69.8. There were rare PVC's.  MOVEMENT/PARASOMNIA:  There were a total of 165 periodic limb movements giving an index of 37.3 with PLM to arousal of 1 and index of 0.2  IMPRESSION/ RECOMMENDATION:  No evidence for Obstructive Sleep Apnea/Hypopnea syndrome with mild increased upper airway resistance. Light snoring Increased Periodic Limb Movement during sleep with a PLMS index of 37.3 Minimal oxygen desaturation during sleep to a nadir of 88%  No evidence for need for CPAP therapy. Due to the patients significant hypersomnolence (ESS score of 18) consider scheduling patient for an Multiple Latency Sleep Test (MLST) to assess for idiopathic hypersomnolence or narcolepsy. Good sleep hygiene  ith an PLMS index of 37.14f patient is symptomatic with restless legs consider a trial of medical therapy   Pe Ell, American Board of Sleep Medicine  ELECTRONICALLY SIGNED ON:  08/29/2014, 1:00 PM [place] PH: [phone]   FX: [phone] [place]

## 2014-08-30 ENCOUNTER — Telehealth: Payer: Self-pay | Admitting: *Deleted

## 2014-08-30 DIAGNOSIS — G47419 Narcolepsy without cataplexy: Secondary | ICD-10-CM

## 2014-08-30 NOTE — Telephone Encounter (Signed)
Patient notified of Sleep Study results.  Appointment scheduled with Dr. Brett Fairy for evaluation of narcolepsy.  Patient notified and voiced understanding.

## 2014-08-31 NOTE — Telephone Encounter (Signed)
Referral placed to Neurologist.

## 2014-09-13 ENCOUNTER — Ambulatory Visit (INDEPENDENT_AMBULATORY_CARE_PROVIDER_SITE_OTHER): Payer: Medicare Other | Admitting: Pharmacist Clinician (PhC)/ Clinical Pharmacy Specialist

## 2014-09-13 DIAGNOSIS — Z7901 Long term (current) use of anticoagulants: Secondary | ICD-10-CM

## 2014-09-13 DIAGNOSIS — Z954 Presence of other heart-valve replacement: Secondary | ICD-10-CM

## 2014-09-13 DIAGNOSIS — Z952 Presence of prosthetic heart valve: Secondary | ICD-10-CM

## 2014-09-13 DIAGNOSIS — I4891 Unspecified atrial fibrillation: Secondary | ICD-10-CM

## 2014-09-13 LAB — POCT INR: INR: 2.5

## 2014-09-29 ENCOUNTER — Telehealth: Payer: Self-pay | Admitting: *Deleted

## 2014-09-29 NOTE — Telephone Encounter (Signed)
Signed surgical clearance faxed.  If warfarin is to be stopped instructed to call for lovenox bridging.

## 2014-10-11 ENCOUNTER — Ambulatory Visit (INDEPENDENT_AMBULATORY_CARE_PROVIDER_SITE_OTHER): Payer: Medicare Other | Admitting: Pharmacist Clinician (PhC)/ Clinical Pharmacy Specialist

## 2014-10-11 DIAGNOSIS — Z7901 Long term (current) use of anticoagulants: Secondary | ICD-10-CM

## 2014-10-11 DIAGNOSIS — Z954 Presence of other heart-valve replacement: Secondary | ICD-10-CM

## 2014-10-11 DIAGNOSIS — I4891 Unspecified atrial fibrillation: Secondary | ICD-10-CM

## 2014-10-11 DIAGNOSIS — Z952 Presence of prosthetic heart valve: Secondary | ICD-10-CM

## 2014-10-11 LAB — POCT INR: INR: 3.8

## 2014-11-01 ENCOUNTER — Ambulatory Visit (INDEPENDENT_AMBULATORY_CARE_PROVIDER_SITE_OTHER): Payer: Medicare Other | Admitting: Pharmacist Clinician (PhC)/ Clinical Pharmacy Specialist

## 2014-11-01 DIAGNOSIS — Z954 Presence of other heart-valve replacement: Secondary | ICD-10-CM

## 2014-11-01 DIAGNOSIS — Z7901 Long term (current) use of anticoagulants: Secondary | ICD-10-CM

## 2014-11-01 DIAGNOSIS — I4891 Unspecified atrial fibrillation: Secondary | ICD-10-CM

## 2014-11-01 DIAGNOSIS — Z952 Presence of prosthetic heart valve: Secondary | ICD-10-CM

## 2014-11-01 LAB — POCT INR: INR: 2.1

## 2014-11-08 DIAGNOSIS — I10 Essential (primary) hypertension: Secondary | ICD-10-CM | POA: Diagnosis not present

## 2014-11-08 DIAGNOSIS — E039 Hypothyroidism, unspecified: Secondary | ICD-10-CM | POA: Diagnosis not present

## 2014-11-08 DIAGNOSIS — I482 Chronic atrial fibrillation: Secondary | ICD-10-CM | POA: Diagnosis not present

## 2014-11-08 DIAGNOSIS — E782 Mixed hyperlipidemia: Secondary | ICD-10-CM | POA: Diagnosis not present

## 2014-11-08 DIAGNOSIS — N183 Chronic kidney disease, stage 3 (moderate): Secondary | ICD-10-CM | POA: Diagnosis not present

## 2014-11-15 DIAGNOSIS — H2512 Age-related nuclear cataract, left eye: Secondary | ICD-10-CM | POA: Diagnosis not present

## 2014-11-15 DIAGNOSIS — H25812 Combined forms of age-related cataract, left eye: Secondary | ICD-10-CM | POA: Diagnosis not present

## 2014-11-16 ENCOUNTER — Encounter: Payer: Medicare Other | Admitting: Cardiovascular Disease

## 2014-11-16 DIAGNOSIS — H2511 Age-related nuclear cataract, right eye: Secondary | ICD-10-CM | POA: Diagnosis not present

## 2014-12-01 DIAGNOSIS — H04123 Dry eye syndrome of bilateral lacrimal glands: Secondary | ICD-10-CM | POA: Diagnosis not present

## 2014-12-02 DIAGNOSIS — H40033 Anatomical narrow angle, bilateral: Secondary | ICD-10-CM | POA: Diagnosis not present

## 2014-12-02 DIAGNOSIS — H2513 Age-related nuclear cataract, bilateral: Secondary | ICD-10-CM | POA: Diagnosis not present

## 2014-12-03 ENCOUNTER — Encounter: Payer: Self-pay | Admitting: Internal Medicine

## 2014-12-07 ENCOUNTER — Encounter: Payer: Self-pay | Admitting: Cardiovascular Disease

## 2014-12-07 ENCOUNTER — Ambulatory Visit (INDEPENDENT_AMBULATORY_CARE_PROVIDER_SITE_OTHER): Payer: Medicare Other | Admitting: *Deleted

## 2014-12-07 ENCOUNTER — Ambulatory Visit (INDEPENDENT_AMBULATORY_CARE_PROVIDER_SITE_OTHER): Payer: Medicare Other | Admitting: Cardiovascular Disease

## 2014-12-07 VITALS — BP 118/70 | HR 88 | Ht 62.0 in | Wt 139.3 lb

## 2014-12-07 DIAGNOSIS — I480 Paroxysmal atrial fibrillation: Secondary | ICD-10-CM | POA: Diagnosis not present

## 2014-12-07 DIAGNOSIS — Z954 Presence of other heart-valve replacement: Secondary | ICD-10-CM

## 2014-12-07 DIAGNOSIS — Z7901 Long term (current) use of anticoagulants: Secondary | ICD-10-CM

## 2014-12-07 DIAGNOSIS — Z952 Presence of prosthetic heart valve: Secondary | ICD-10-CM

## 2014-12-07 DIAGNOSIS — I10 Essential (primary) hypertension: Secondary | ICD-10-CM

## 2014-12-07 DIAGNOSIS — Z950 Presence of cardiac pacemaker: Secondary | ICD-10-CM

## 2014-12-07 DIAGNOSIS — I4891 Unspecified atrial fibrillation: Secondary | ICD-10-CM

## 2014-12-07 DIAGNOSIS — I482 Chronic atrial fibrillation, unspecified: Secondary | ICD-10-CM

## 2014-12-07 LAB — MDC_IDC_ENUM_SESS_TYPE_INCLINIC
Battery Impedance: 231 Ohm
Brady Statistic AP VS Percent: 0.1 % — CL
Lead Channel Impedance Value: 475 Ohm
Lead Channel Sensing Intrinsic Amplitude: 2 mV
Lead Channel Sensing Intrinsic Amplitude: 8 mV
Lead Channel Setting Pacing Pulse Width: 0.4 ms
Lead Channel Setting Sensing Sensitivity: 2.8 mV
MDC IDC MSMT BATTERY VOLTAGE: 2.79 V
MDC IDC MSMT LEADCHNL RV IMPEDANCE VALUE: 499 Ohm
MDC IDC MSMT LEADCHNL RV PACING THRESHOLD AMPLITUDE: 0.75 V
MDC IDC MSMT LEADCHNL RV PACING THRESHOLD PULSEWIDTH: 0.4 ms
MDC IDC SET LEADCHNL RV PACING AMPLITUDE: 2 V
MDC IDC STAT BRADY AP VP PERCENT: 15.1 %
MDC IDC STAT BRADY AS VP PERCENT: 36 %
MDC IDC STAT BRADY AS VS PERCENT: 48.8 %

## 2014-12-07 NOTE — Progress Notes (Signed)
Patient ID: Alice Ballard, female   DOB: [birth year], 74 y.o.   MRN: [id number]      Reason for office visit Valvular heart disease (status post aortic valve replacement and mitral valve replacement) Atrial fibrillation Pacemaker followup  Alice Ballard has no change in her cardiac complaints. She has orthostatic dizziness in the mornings, minimal ankle edema. No bleeding problems. She continues tho have frequent episodes of sudden sleep during the day. She does not have OSA by previous sleep study. Her daughter may have narcolepsy. She had uneventful left cataract removal/lens implant and is planning the right eye surgery soon.  INR is a little too low today at 2.1.  Pacemaker check shows uninterupted atrial fibrillation since last March, likely now permanent. Ventricular response is slow and she has 100% V pacing since last device check, despite no rate control meds.  Alice Ballard is a 74 year old woman with a mechanical aortic and mitral valve prostheses as well as recurrent persistent atrial fibrillation, hypertension, dyslipidemia. In 1986 for mitral valve was replaced with a 29 mm St. Jude prosthesis for rheumatic disease. In 1994 her aortic valve was replaced with a 19 mm St. Jude device. Has chronic moderate elevation in the valvular gradients across the aortic valve, likely an expression of patient's valve mismatch. Fluoroscopy has shown normal disc motion. Previous right heart catheterization had shown only very mild pulmonary hypertension (mean PA pressure 25 mm Hg, wedge pressure 16 mm Hg) She has no history of coronary artery disease. She has a dual-chamber permanent pacemaker (Medtronic) that was implanted in 2011 for tachycardia-bradycardia syndrome. She is on chronic warfarin anticoagulation both for her mechanical valves and for atrial fibrillation. She seems to now have permanent atrial fibrillation, with slow ventricular rate, 50-100% V paced. She has not had bleeding complications or embolic  events. She does not have a history of stroke.  Allergies  Allergen Reactions  . Tegretol [Carbamazepine] Other (See Comments)    REACTION: Headaches  . Shellfish Allergy Rash    Current Outpatient Prescriptions  Medication Sig Dispense Refill  . acetaminophen (TYLENOL) 500 MG tablet Take 1,000 mg by mouth every 6 (six) hours as needed. For pain    . alendronate (FOSAMAX) 70 MG tablet Take 70 mg by mouth every 7 (seven) days. Take with a full glass of water on an empty stomach. Takes on Sunday.    . Ascorbic Acid (VITAMIN C) 1000 MG tablet Take 1,000 mg by mouth daily.    [name] atorvastatin (LIPITOR) 80 MG tablet Take 0.5 tablets (40 mg total) by mouth at bedtime. 15 tablet 5  . Cholecalciferol (VITAMIN D3) 2000 UNITS capsule Take 2,000 Units by mouth daily.    . ferrous sulfate 325 (65 FE) MG tablet Take 1 tablet (325 mg total) by mouth daily. 30 tablet 3  . fluticasone (FLONASE) 50 MCG/ACT nasal spray Place 2 sprays into both nostrils 2 (two) times daily.    . folic acid (FOLVITE) 1 MG tablet Take 1 tablet (1 mg total) by mouth daily. 30 tablet 5  . hydrochlorothiazide (HYDRODIURIL) 25 MG tablet Take 1 tablet (25 mg total) by mouth daily. 30 tablet 5  . levETIRAcetam (KEPPRA) 500 MG tablet Take 500 mg by mouth 2 (two) times daily.    [name] lisinopril (PRINIVIL,ZESTRIL) 30 MG tablet Take 1 tablet (30 mg total) by mouth daily. 30 tablet 5  . omeprazole (PRILOSEC) 20 MG capsule Take 20 mg by mouth daily.    . pantoprazole (PROTONIX) 40 MG tablet Take 40  mg by mouth daily as needed. For heartburn    . Polyethyl Glycol-Propyl Glycol (SYSTANE OP) Place 1 drop into both eyes daily as needed. For dry eyes    . warfarin (COUMADIN) 2.5 MG tablet Take 2 tablets by mouth daily or as directed by coumadin clinic 60 tablet 5   No current facility-administered medications for this visit.    Past Medical History  Diagnosis Date  . UTI (urinary tract infection)   . Sick sinus syndrome   . Paroxysmal atrial  fibrillation   . Hypertension   . Seizure disorder   . Coronary artery disease   . Hyperlipidemia   . Cerebral vascular disease   . GERD (gastroesophageal reflux disease)   . Tubular adenoma of colon 11/2010  . Hemorrhoids   . Hiatal hernia   . Fatty liver   . Spinal stenosis   . Gastric polyp   . Pacemaker july 2011    medtronic adapta model # ADDRL 1,SERIAL #[id number]    Past Surgical History  Procedure Laterality Date  . Pacemaker placement  05/2010  . Aortic and mitral valve replacement  1994    87mm St. Jude  . Mitral valve replacement  1986    St Jude prosthesis   . Cardiac catheterization  04/2010  . Back surgery    . Tee without cardioversion  07/11/2012    Procedure: TRANSESOPHAGEAL ECHOCARDIOGRAM (TEE);  Surgeon: Sanda Klein, MD;  Location: [facility];  Service: Cardiovascular;  Laterality: N/A;  . Doppler echocardiography  sept 06, 2013    aotric valve grad. peak 51 mmHg and mean 28 mm Hg  dimensional subtractive index 0.26; mitral valve grad peak 25 and mean 80mm Hg norm pressure halftime 8msec.  [name] Nm myocar perf wall motion  03/31/2008    EF 69% low risk  . Nm myocar perf wall motion  04/19/2006    EF 69%    Family History  Problem Relation Age of Onset  . Colon cancer Sister   . Colon polyps Sister   . Heart disease Mother     History   Social History  . Marital Status: Married    Spouse Name: N/A    Number of Children: N/A  . Years of Education: N/A   Occupational History  . DISABLED    Social History Main Topics  . Smoking status: Never Smoker   . Smokeless tobacco: Never Used  . Alcohol Use: No  . Drug Use: No  . Sexual Activity: Not on file   Other Topics Concern  . Not on file   Social History Narrative    Review of systems: The patient specifically denies any chest pain at rest or with exertion, dyspnea at rest or with exertion, orthopnea, paroxysmal nocturnal dyspnea, syncope, palpitations, focal neurological deficits, intermittent  claudication, lower extremity edema, unexplained weight gain, cough, hemoptysis or wheezing.  The patient also denies abdominal pain, nausea, vomiting, dysphagia, diarrhea, constipation, polyuria, polydipsia, dysuria, hematuria, frequency, urgency, abnormal bleeding or bruising, fever, chills, unexpected weight changes, mood swings, change in skin or hair texture, change in voice quality, auditory or visual problems, allergic reactions or rashes, new musculoskeletal complaints other than usual "aches and pains".  PHYSICAL EXAM BP 118/70 mmHg  Pulse 88  Ht 5\' 2"  (1.575 m)  Wt 139 lb 4.8 oz (63.186 kg)  BMI 25.47 kg/m2 General: Alert, oriented x3, no distress Head: no evidence of trauma, PERRL, EOMI, no exophtalmos or lid lag, no myxedema, no xanthelasma; normal ears, nose and  oropharynx Neck: normal jugular venous pulsations and no hepatojugular reflux; brisk carotid pulses without delay and no carotid bruits Chest: clear to auscultation, no signs of consolidation by percussion or palpation, normal fremitus, symmetrical and full respiratory excursions Cardiovascular: normal position and quality of the apical impulse, regular rhythm, normal first and split second heart sound with very crisp prosthetic valve clicks, grade 2/6 systolic ejection murmur is early peaking, no diastolic murmurs, rubs or gallops Abdomen: no tenderness or distention, no masses by palpation, no abnormal pulsatility or arterial bruits, normal bowel sounds, no hepatosplenomegaly Extremities: no clubbing, cyanosis or edema; 2+ radial, ulnar and brachial pulses bilaterally; 2+ right femoral, posterior tibial and dorsalis pedis pulses; 2+ left femoral, posterior tibial and dorsalis pedis pulses; no subclavian or femoral bruits Neurological: grossly nonfocal   EKG: Atrial fibrillation with ventricular paced rhythm  Lipid Panel  Chol 146, TG 175, HDL 71, DLDL 60, normal TSH, platelets 108K  BMET    Component Value Date/Time     NA 142 06/22/2014 0949   K 4.5 06/22/2014 0949   CL 107 06/22/2014 0949   CO2 27 06/22/2014 0949   GLUCOSE 72 06/22/2014 0949   BUN 16 06/22/2014 0949   CREATININE 1.06 06/22/2014 0949   CREATININE 1.57* 07/06/2012 0445   CALCIUM 9.0 06/22/2014 0949   GFRNONAA 33* 07/06/2012 0445   GFRAA 38* 07/06/2012 0445     ASSESSMENT AND PLAN  HTN (hypertension) Good control.  CKD (chronic kidney disease), stage III Baseline creatinine 1.4-1.5  Pacemaker: Medtronic 2011 for tachy brady AFIB Normal pacemaker function.  Reprogrammed VVIR for probably permanent AF. Generator longevity estimated > 9 years. No clinical CHF despite 100% V pacing.  Atrial fibrillation Seems to now be a permanent arrhythmia. Asymptomatic.  She is on warfarin anticoagulation. She has never had a cardioembolic event that we are aware of.   Hx of mechanical aortic valve replacement She has a rather small (19 mm) mechanical aortic valve prosthesis that was placed in 1994. She had a syncopal event 2 years ago concern was raised about possible valve dysfunction, as the gradients were elevated. Evaluation fluoroscopy and TEE did not show evidence of any abnormalities in valve motion. By her most recent echocardiogram gradients are a little high with a peak of 31 and a mean of 20 mm Hg. It is likely that the gradients are elevated due to some degree of prosthetic valve mismatch. She has not had any new syncopal events.  Traget INR 3.0. Increase warfarin dose.  Mitral valve replaced 1994 St. Jude 29 mm mechanical mitral valve prosthesis placed in 1986 with normal function by echo most recently performed in September of 2015 (mean gradient 6 mm Hg, pressure halftime 90 ms). Important to avoid rapid ventricular rates - no longer an issue as her AV node seems to be much slower.  Hyperlipidemia Lipid profile excellent  Question Narcolepsy Recommended Neurology referral, but she does not want to do this right now.   EKG:  Atrial fibrillation with ventricular paced rhythm Orders Placed This Encounter  Procedures  . Implantable device check  . EKG 12-Lead   No orders of the defined types were placed in this encounter.    Holli Humbles, MD, [place] [phone] office [phone] pager

## 2014-12-07 NOTE — Patient Instructions (Addendum)
Take 7.5mg  of coumadin today and Alice Ballard will call in AM with instruction and your next appointment.  Remote monitoring is used to monitor your Pacemaker or ICD from home. This monitoring reduces the number of office visits required to check your device to one time per year. It allows [place] to monitor the functioning of your device to ensure it is working properly. You are scheduled for a device check from home on Mar 08, 2015. You may send your transmission at any time that day. If you have a wireless device, the transmission will be sent automatically. After your physician reviews your transmission, you will receive a postcard with your next transmission date.  Dr. Sallyanne Kuster recommends that you schedule a follow-up appointment in: One year.

## 2014-12-08 LAB — POCT INR: INR: 2.1

## 2014-12-09 ENCOUNTER — Encounter: Payer: Self-pay | Admitting: Cardiovascular Disease

## 2015-01-07 ENCOUNTER — Ambulatory Visit (INDEPENDENT_AMBULATORY_CARE_PROVIDER_SITE_OTHER): Payer: Medicare Other | Admitting: Pharmacist Clinician (PhC)/ Clinical Pharmacy Specialist

## 2015-01-07 DIAGNOSIS — I4891 Unspecified atrial fibrillation: Secondary | ICD-10-CM | POA: Diagnosis not present

## 2015-01-07 DIAGNOSIS — Z954 Presence of other heart-valve replacement: Secondary | ICD-10-CM | POA: Diagnosis not present

## 2015-01-07 DIAGNOSIS — Z952 Presence of prosthetic heart valve: Secondary | ICD-10-CM

## 2015-01-07 DIAGNOSIS — Z7901 Long term (current) use of anticoagulants: Secondary | ICD-10-CM

## 2015-01-07 DIAGNOSIS — I482 Chronic atrial fibrillation, unspecified: Secondary | ICD-10-CM

## 2015-01-07 LAB — POCT INR: INR: 1.7

## 2015-01-10 DIAGNOSIS — H25811 Combined forms of age-related cataract, right eye: Secondary | ICD-10-CM | POA: Diagnosis not present

## 2015-01-10 DIAGNOSIS — H2511 Age-related nuclear cataract, right eye: Secondary | ICD-10-CM | POA: Diagnosis not present

## 2015-01-28 ENCOUNTER — Ambulatory Visit (INDEPENDENT_AMBULATORY_CARE_PROVIDER_SITE_OTHER): Payer: Medicare Other | Admitting: Pharmacist Clinician (PhC)/ Clinical Pharmacy Specialist

## 2015-01-28 DIAGNOSIS — Z954 Presence of other heart-valve replacement: Secondary | ICD-10-CM | POA: Diagnosis not present

## 2015-01-28 DIAGNOSIS — I4891 Unspecified atrial fibrillation: Secondary | ICD-10-CM | POA: Diagnosis not present

## 2015-01-28 DIAGNOSIS — I482 Chronic atrial fibrillation, unspecified: Secondary | ICD-10-CM

## 2015-01-28 DIAGNOSIS — Z7901 Long term (current) use of anticoagulants: Secondary | ICD-10-CM | POA: Diagnosis not present

## 2015-01-28 DIAGNOSIS — Z952 Presence of prosthetic heart valve: Secondary | ICD-10-CM

## 2015-01-28 LAB — POCT INR: INR: 2

## 2015-02-01 DIAGNOSIS — N183 Chronic kidney disease, stage 3 (moderate): Secondary | ICD-10-CM | POA: Diagnosis not present

## 2015-02-02 ENCOUNTER — Telehealth: Payer: Self-pay | Admitting: *Deleted

## 2015-02-02 NOTE — Telephone Encounter (Signed)
Informed patient that the Staci Acosta was ordered on 2/2 and is currently "In fulfillment", so it should be received shortly. I also confirmed that address that it was mailed to and pt said it was correct. Patient voiced understanding to info given.

## 2015-02-08 DIAGNOSIS — N183 Chronic kidney disease, stage 3 (moderate): Secondary | ICD-10-CM | POA: Diagnosis not present

## 2015-02-08 DIAGNOSIS — I10 Essential (primary) hypertension: Secondary | ICD-10-CM | POA: Diagnosis not present

## 2015-02-08 DIAGNOSIS — E782 Mixed hyperlipidemia: Secondary | ICD-10-CM | POA: Diagnosis not present

## 2015-02-08 DIAGNOSIS — E039 Hypothyroidism, unspecified: Secondary | ICD-10-CM | POA: Diagnosis not present

## 2015-02-08 DIAGNOSIS — Z1389 Encounter for screening for other disorder: Secondary | ICD-10-CM | POA: Diagnosis not present

## 2015-02-08 DIAGNOSIS — D649 Anemia, unspecified: Secondary | ICD-10-CM | POA: Diagnosis not present

## 2015-02-14 DIAGNOSIS — I700 Atherosclerosis of aorta: Secondary | ICD-10-CM | POA: Diagnosis not present

## 2015-02-14 DIAGNOSIS — K838 Other specified diseases of biliary tract: Secondary | ICD-10-CM | POA: Diagnosis not present

## 2015-02-14 DIAGNOSIS — K8020 Calculus of gallbladder without cholecystitis without obstruction: Secondary | ICD-10-CM | POA: Diagnosis not present

## 2015-02-14 DIAGNOSIS — N281 Cyst of kidney, acquired: Secondary | ICD-10-CM | POA: Diagnosis not present

## 2015-02-17 ENCOUNTER — Other Ambulatory Visit: Payer: Self-pay | Admitting: Cardiovascular Disease

## 2015-02-17 NOTE — Telephone Encounter (Signed)
E SENT TO PHARMACY 

## 2015-02-25 ENCOUNTER — Ambulatory Visit (INDEPENDENT_AMBULATORY_CARE_PROVIDER_SITE_OTHER): Payer: Medicare Other | Admitting: Pharmacist Clinician (PhC)/ Clinical Pharmacy Specialist

## 2015-02-25 DIAGNOSIS — I482 Chronic atrial fibrillation, unspecified: Secondary | ICD-10-CM

## 2015-02-25 DIAGNOSIS — I4891 Unspecified atrial fibrillation: Secondary | ICD-10-CM | POA: Diagnosis not present

## 2015-02-25 DIAGNOSIS — Z7901 Long term (current) use of anticoagulants: Secondary | ICD-10-CM

## 2015-02-25 DIAGNOSIS — Z954 Presence of other heart-valve replacement: Secondary | ICD-10-CM

## 2015-02-25 DIAGNOSIS — Z952 Presence of prosthetic heart valve: Secondary | ICD-10-CM

## 2015-02-25 LAB — POCT INR: INR: 2.2

## 2015-03-04 ENCOUNTER — Telehealth: Payer: Self-pay | Admitting: Cardiovascular Disease

## 2015-03-04 NOTE — Telephone Encounter (Signed)
Will forward to the device clinic 

## 2015-03-04 NOTE — Telephone Encounter (Signed)
Instructed pt and husband how to set up and send a manual transmission.

## 2015-03-04 NOTE — Telephone Encounter (Signed)
Pt need to check her pacemaker at home.Pt has a cell phone, but does not have a cord for the cell phone.

## 2015-03-08 ENCOUNTER — Telehealth: Payer: Self-pay | Admitting: Cardiology

## 2015-03-08 ENCOUNTER — Ambulatory Visit (INDEPENDENT_AMBULATORY_CARE_PROVIDER_SITE_OTHER): Payer: Medicare Other | Admitting: *Deleted

## 2015-03-08 DIAGNOSIS — I480 Paroxysmal atrial fibrillation: Secondary | ICD-10-CM

## 2015-03-08 NOTE — Progress Notes (Signed)
Remote pacemaker transmission.   

## 2015-03-08 NOTE — Telephone Encounter (Signed)
Spoke with pt and reminded pt of remote transmission that is due today. Pt verbalized understanding.   

## 2015-03-11 ENCOUNTER — Encounter: Payer: Self-pay | Admitting: Cardiovascular Disease

## 2015-03-12 LAB — CUP PACEART REMOTE DEVICE CHECK
Battery Impedance: 231 Ohm
Brady Statistic RV Percent Paced: 99 %
Date Time Interrogation Session: 20160503151911
Lead Channel Impedance Value: 482 Ohm
Lead Channel Impedance Value: 67 Ohm
Lead Channel Pacing Threshold Amplitude: 0.625 V
Lead Channel Setting Pacing Amplitude: 2.5 V
Lead Channel Setting Pacing Pulse Width: 0.4 ms
Lead Channel Setting Sensing Sensitivity: 2.8 mV
MDC IDC MSMT BATTERY REMAINING LONGEVITY: 115 mo
MDC IDC MSMT BATTERY VOLTAGE: 2.79 V
MDC IDC MSMT LEADCHNL RV PACING THRESHOLD PULSEWIDTH: 0.4 ms

## 2015-03-17 ENCOUNTER — Encounter: Payer: Self-pay | Admitting: Cardiology

## 2015-03-21 ENCOUNTER — Other Ambulatory Visit: Payer: Self-pay | Admitting: Cardiovascular Disease

## 2015-03-22 ENCOUNTER — Encounter: Payer: Self-pay | Admitting: Cardiovascular Disease

## 2015-03-25 ENCOUNTER — Ambulatory Visit (INDEPENDENT_AMBULATORY_CARE_PROVIDER_SITE_OTHER): Payer: Medicare Other | Admitting: Pharmacist Clinician (PhC)/ Clinical Pharmacy Specialist

## 2015-03-25 DIAGNOSIS — I4891 Unspecified atrial fibrillation: Secondary | ICD-10-CM

## 2015-03-25 DIAGNOSIS — Z952 Presence of prosthetic heart valve: Secondary | ICD-10-CM

## 2015-03-25 DIAGNOSIS — Z954 Presence of other heart-valve replacement: Secondary | ICD-10-CM

## 2015-03-25 DIAGNOSIS — I482 Chronic atrial fibrillation, unspecified: Secondary | ICD-10-CM

## 2015-03-25 DIAGNOSIS — Z7901 Long term (current) use of anticoagulants: Secondary | ICD-10-CM

## 2015-03-25 LAB — POCT INR: INR: 2.4

## 2015-04-18 DIAGNOSIS — G40309 Generalized idiopathic epilepsy and epileptic syndromes, not intractable, without status epilepticus: Secondary | ICD-10-CM | POA: Diagnosis not present

## 2015-04-18 DIAGNOSIS — I482 Chronic atrial fibrillation: Secondary | ICD-10-CM | POA: Diagnosis not present

## 2015-04-18 DIAGNOSIS — Z950 Presence of cardiac pacemaker: Secondary | ICD-10-CM | POA: Diagnosis not present

## 2015-04-18 DIAGNOSIS — Z952 Presence of prosthetic heart valve: Secondary | ICD-10-CM | POA: Diagnosis not present

## 2015-04-18 DIAGNOSIS — J019 Acute sinusitis, unspecified: Secondary | ICD-10-CM | POA: Diagnosis not present

## 2015-04-26 ENCOUNTER — Ambulatory Visit (INDEPENDENT_AMBULATORY_CARE_PROVIDER_SITE_OTHER): Payer: Medicare Other | Admitting: Pharmacist

## 2015-04-26 DIAGNOSIS — Z7901 Long term (current) use of anticoagulants: Secondary | ICD-10-CM | POA: Diagnosis not present

## 2015-04-26 DIAGNOSIS — I4891 Unspecified atrial fibrillation: Secondary | ICD-10-CM

## 2015-04-26 DIAGNOSIS — I482 Chronic atrial fibrillation, unspecified: Secondary | ICD-10-CM

## 2015-04-26 DIAGNOSIS — Z954 Presence of other heart-valve replacement: Secondary | ICD-10-CM | POA: Diagnosis not present

## 2015-04-26 DIAGNOSIS — Z952 Presence of prosthetic heart valve: Secondary | ICD-10-CM

## 2015-04-26 LAB — POCT INR: INR: 2.1

## 2015-05-02 DIAGNOSIS — I10 Essential (primary) hypertension: Secondary | ICD-10-CM | POA: Diagnosis not present

## 2015-05-02 DIAGNOSIS — E039 Hypothyroidism, unspecified: Secondary | ICD-10-CM | POA: Diagnosis not present

## 2015-05-02 DIAGNOSIS — D649 Anemia, unspecified: Secondary | ICD-10-CM | POA: Diagnosis not present

## 2015-05-02 DIAGNOSIS — E782 Mixed hyperlipidemia: Secondary | ICD-10-CM | POA: Diagnosis not present

## 2015-05-02 DIAGNOSIS — I482 Chronic atrial fibrillation: Secondary | ICD-10-CM | POA: Diagnosis not present

## 2015-05-06 DIAGNOSIS — I10 Essential (primary) hypertension: Secondary | ICD-10-CM | POA: Diagnosis not present

## 2015-05-06 DIAGNOSIS — E782 Mixed hyperlipidemia: Secondary | ICD-10-CM | POA: Diagnosis not present

## 2015-05-06 DIAGNOSIS — E039 Hypothyroidism, unspecified: Secondary | ICD-10-CM | POA: Diagnosis not present

## 2015-05-06 DIAGNOSIS — K824 Cholesterolosis of gallbladder: Secondary | ICD-10-CM | POA: Diagnosis not present

## 2015-05-06 DIAGNOSIS — K219 Gastro-esophageal reflux disease without esophagitis: Secondary | ICD-10-CM | POA: Diagnosis not present

## 2015-06-07 ENCOUNTER — Ambulatory Visit (INDEPENDENT_AMBULATORY_CARE_PROVIDER_SITE_OTHER): Payer: Medicare Other | Admitting: *Deleted

## 2015-06-07 ENCOUNTER — Telehealth: Payer: Self-pay | Admitting: Cardiovascular Disease

## 2015-06-07 ENCOUNTER — Telehealth: Payer: Self-pay | Admitting: Cardiology

## 2015-06-07 DIAGNOSIS — I480 Paroxysmal atrial fibrillation: Secondary | ICD-10-CM

## 2015-06-07 NOTE — Telephone Encounter (Signed)
New message     Pt husband calling about transmission for today Husband needing help with transmission

## 2015-06-07 NOTE — Telephone Encounter (Signed)
Spoke with pt and reminded pt of remote transmission that is due today. Pt verbalized understanding.   

## 2015-06-08 ENCOUNTER — Encounter: Payer: Self-pay | Admitting: Cardiology

## 2015-06-08 ENCOUNTER — Ambulatory Visit (INDEPENDENT_AMBULATORY_CARE_PROVIDER_SITE_OTHER): Payer: Medicare Other | Admitting: Pharmacist Clinician (PhC)/ Clinical Pharmacy Specialist

## 2015-06-08 DIAGNOSIS — I482 Chronic atrial fibrillation, unspecified: Secondary | ICD-10-CM

## 2015-06-08 DIAGNOSIS — Z954 Presence of other heart-valve replacement: Secondary | ICD-10-CM

## 2015-06-08 DIAGNOSIS — Z7901 Long term (current) use of anticoagulants: Secondary | ICD-10-CM

## 2015-06-08 DIAGNOSIS — I480 Paroxysmal atrial fibrillation: Secondary | ICD-10-CM | POA: Diagnosis not present

## 2015-06-08 DIAGNOSIS — Z952 Presence of prosthetic heart valve: Secondary | ICD-10-CM

## 2015-06-08 DIAGNOSIS — I4891 Unspecified atrial fibrillation: Secondary | ICD-10-CM | POA: Diagnosis not present

## 2015-06-08 LAB — POCT INR: INR: 2.5

## 2015-06-08 NOTE — Telephone Encounter (Signed)
Helped pt send remote transmission. Transmission received.

## 2015-06-09 NOTE — Progress Notes (Signed)
Remote pacemaker transmission.   

## 2015-06-14 ENCOUNTER — Encounter: Payer: Self-pay | Admitting: Cardiovascular Disease

## 2015-06-16 LAB — CUP PACEART REMOTE DEVICE CHECK
Battery Remaining Longevity: 108 mo
Battery Voltage: 2.79 V
Brady Statistic RV Percent Paced: 99 %
Date Time Interrogation Session: 20160803195408
Lead Channel Impedance Value: 490 Ohm
Lead Channel Impedance Value: 67 Ohm
Lead Channel Pacing Threshold Amplitude: 0.625 V
Lead Channel Pacing Threshold Pulse Width: 0.4 ms
Lead Channel Setting Pacing Amplitude: 2.5 V
Lead Channel Setting Pacing Pulse Width: 0.4 ms
Lead Channel Setting Sensing Sensitivity: 4 mV
MDC IDC MSMT BATTERY IMPEDANCE: 279 Ohm

## 2015-06-17 ENCOUNTER — Other Ambulatory Visit: Payer: Self-pay | Admitting: Cardiovascular Disease

## 2015-06-20 DIAGNOSIS — L03115 Cellulitis of right lower limb: Secondary | ICD-10-CM | POA: Diagnosis not present

## 2015-06-23 DIAGNOSIS — L03115 Cellulitis of right lower limb: Secondary | ICD-10-CM | POA: Diagnosis not present

## 2015-06-24 ENCOUNTER — Telehealth: Payer: Self-pay | Admitting: Pharmacist Clinician (PhC)/ Clinical Pharmacy Specialist

## 2015-06-24 NOTE — Telephone Encounter (Signed)
Received info from insurance plan that patient was started on smx/tmp.  Called pt and she stated was also put on prednisone.  Asked her to hold warfarin dose on Saturday only, then continue with normal dose and come in Monday morning for INR check.  Pt voiced agreement, appt set for 8:50 am

## 2015-06-27 ENCOUNTER — Ambulatory Visit (INDEPENDENT_AMBULATORY_CARE_PROVIDER_SITE_OTHER): Payer: Medicare Other | Admitting: Pharmacist Clinician (PhC)/ Clinical Pharmacy Specialist

## 2015-06-27 DIAGNOSIS — Z954 Presence of other heart-valve replacement: Secondary | ICD-10-CM | POA: Diagnosis not present

## 2015-06-27 DIAGNOSIS — I4891 Unspecified atrial fibrillation: Secondary | ICD-10-CM

## 2015-06-27 DIAGNOSIS — I482 Chronic atrial fibrillation, unspecified: Secondary | ICD-10-CM

## 2015-06-27 DIAGNOSIS — Z7901 Long term (current) use of anticoagulants: Secondary | ICD-10-CM | POA: Diagnosis not present

## 2015-06-27 DIAGNOSIS — Z952 Presence of prosthetic heart valve: Secondary | ICD-10-CM

## 2015-06-27 LAB — POCT INR: INR: 4.2

## 2015-06-28 DIAGNOSIS — L03115 Cellulitis of right lower limb: Secondary | ICD-10-CM | POA: Diagnosis not present

## 2015-06-29 ENCOUNTER — Encounter: Payer: Self-pay | Admitting: Cardiology

## 2015-07-01 ENCOUNTER — Encounter: Payer: Self-pay | Admitting: Cardiovascular Disease

## 2015-07-04 ENCOUNTER — Ambulatory Visit (INDEPENDENT_AMBULATORY_CARE_PROVIDER_SITE_OTHER): Payer: Medicare Other | Admitting: Pharmacist Clinician (PhC)/ Clinical Pharmacy Specialist

## 2015-07-04 DIAGNOSIS — I4891 Unspecified atrial fibrillation: Secondary | ICD-10-CM

## 2015-07-04 DIAGNOSIS — Z954 Presence of other heart-valve replacement: Secondary | ICD-10-CM

## 2015-07-04 DIAGNOSIS — I482 Chronic atrial fibrillation, unspecified: Secondary | ICD-10-CM

## 2015-07-04 DIAGNOSIS — Z7901 Long term (current) use of anticoagulants: Secondary | ICD-10-CM

## 2015-07-04 DIAGNOSIS — Z952 Presence of prosthetic heart valve: Secondary | ICD-10-CM

## 2015-07-04 LAB — POCT INR: INR: 3.6

## 2015-07-18 ENCOUNTER — Ambulatory Visit (INDEPENDENT_AMBULATORY_CARE_PROVIDER_SITE_OTHER): Payer: Medicare Other | Admitting: Pharmacist Clinician (PhC)/ Clinical Pharmacy Specialist

## 2015-07-18 DIAGNOSIS — Z954 Presence of other heart-valve replacement: Secondary | ICD-10-CM

## 2015-07-18 DIAGNOSIS — Z952 Presence of prosthetic heart valve: Secondary | ICD-10-CM

## 2015-07-18 DIAGNOSIS — I4891 Unspecified atrial fibrillation: Secondary | ICD-10-CM | POA: Diagnosis not present

## 2015-07-18 DIAGNOSIS — Z7901 Long term (current) use of anticoagulants: Secondary | ICD-10-CM

## 2015-07-18 DIAGNOSIS — I482 Chronic atrial fibrillation, unspecified: Secondary | ICD-10-CM

## 2015-07-18 LAB — POCT INR: INR: 2.2

## 2015-07-20 ENCOUNTER — Ambulatory Visit: Payer: Medicare Other | Admitting: Pharmacist Clinician (PhC)/ Clinical Pharmacy Specialist

## 2015-08-08 ENCOUNTER — Ambulatory Visit (INDEPENDENT_AMBULATORY_CARE_PROVIDER_SITE_OTHER): Payer: Medicare Other | Admitting: Pharmacist Clinician (PhC)/ Clinical Pharmacy Specialist

## 2015-08-08 DIAGNOSIS — I482 Chronic atrial fibrillation, unspecified: Secondary | ICD-10-CM

## 2015-08-08 DIAGNOSIS — Z7901 Long term (current) use of anticoagulants: Secondary | ICD-10-CM

## 2015-08-08 DIAGNOSIS — Z954 Presence of other heart-valve replacement: Secondary | ICD-10-CM

## 2015-08-08 DIAGNOSIS — I4891 Unspecified atrial fibrillation: Secondary | ICD-10-CM

## 2015-08-08 DIAGNOSIS — Z952 Presence of prosthetic heart valve: Secondary | ICD-10-CM

## 2015-08-08 LAB — POCT INR: INR: 2.4

## 2015-08-16 ENCOUNTER — Other Ambulatory Visit: Payer: Self-pay | Admitting: Cardiovascular Disease

## 2015-08-16 NOTE — Telephone Encounter (Signed)
REFILL 

## 2015-09-02 DIAGNOSIS — Z23 Encounter for immunization: Secondary | ICD-10-CM | POA: Diagnosis not present

## 2015-09-07 ENCOUNTER — Ambulatory Visit (INDEPENDENT_AMBULATORY_CARE_PROVIDER_SITE_OTHER): Payer: Medicare Other | Admitting: Pharmacist Clinician (PhC)/ Clinical Pharmacy Specialist

## 2015-09-07 DIAGNOSIS — I4891 Unspecified atrial fibrillation: Secondary | ICD-10-CM

## 2015-09-07 DIAGNOSIS — Z954 Presence of other heart-valve replacement: Secondary | ICD-10-CM | POA: Diagnosis not present

## 2015-09-07 DIAGNOSIS — I482 Chronic atrial fibrillation, unspecified: Secondary | ICD-10-CM

## 2015-09-07 DIAGNOSIS — Z7901 Long term (current) use of anticoagulants: Secondary | ICD-10-CM | POA: Diagnosis not present

## 2015-09-07 DIAGNOSIS — Z952 Presence of prosthetic heart valve: Secondary | ICD-10-CM

## 2015-09-07 LAB — POCT INR: INR: 2

## 2015-09-12 ENCOUNTER — Ambulatory Visit (INDEPENDENT_AMBULATORY_CARE_PROVIDER_SITE_OTHER): Payer: Medicare Other | Admitting: *Deleted

## 2015-09-12 ENCOUNTER — Telehealth: Payer: Self-pay | Admitting: Cardiology

## 2015-09-12 DIAGNOSIS — I4891 Unspecified atrial fibrillation: Secondary | ICD-10-CM

## 2015-09-12 NOTE — Progress Notes (Signed)
Remote pacemaker transmission.   

## 2015-09-12 NOTE — Telephone Encounter (Signed)
Spoke with pt and reminded pt of remote transmission that is due today. Pt verbalized understanding.   

## 2015-09-13 ENCOUNTER — Encounter: Payer: Self-pay | Admitting: Cardiology

## 2015-09-13 LAB — CUP PACEART REMOTE DEVICE CHECK
Battery Impedance: 279 Ohm
Brady Statistic RV Percent Paced: 98 %
Date Time Interrogation Session: 20161107170736
Implantable Lead Implant Date: 20110701
Implantable Lead Location: 753860
Implantable Lead Model: 5076
Implantable Lead Model: 5076
Lead Channel Impedance Value: 471 Ohm
Lead Channel Impedance Value: 67 Ohm
Lead Channel Pacing Threshold Pulse Width: 0.4 ms
MDC IDC LEAD IMPLANT DT: 20110701
MDC IDC LEAD LOCATION: 753859
MDC IDC MSMT BATTERY REMAINING LONGEVITY: 108 mo
MDC IDC MSMT BATTERY VOLTAGE: 2.79 V
MDC IDC MSMT LEADCHNL RV PACING THRESHOLD AMPLITUDE: 0.625 V
MDC IDC SET LEADCHNL RV PACING AMPLITUDE: 2.5 V
MDC IDC SET LEADCHNL RV PACING PULSEWIDTH: 0.4 ms
MDC IDC SET LEADCHNL RV SENSING SENSITIVITY: 2.8 mV

## 2015-09-15 ENCOUNTER — Other Ambulatory Visit: Payer: Self-pay | Admitting: *Deleted

## 2015-09-15 MED ORDER — ATORVASTATIN CALCIUM 80 MG PO TABS: 40.0000 mg | ORAL_TABLET | Freq: Every day | ORAL | Status: DC

## 2015-09-15 MED ORDER — FOLIC ACID 1 MG PO TABS: 1.0000 mg | ORAL_TABLET | Freq: Every day | ORAL | Status: DC

## 2015-10-01 DIAGNOSIS — G40309 Generalized idiopathic epilepsy and epileptic syndromes, not intractable, without status epilepticus: Secondary | ICD-10-CM | POA: Diagnosis not present

## 2015-10-01 DIAGNOSIS — E782 Mixed hyperlipidemia: Secondary | ICD-10-CM | POA: Diagnosis not present

## 2015-10-01 DIAGNOSIS — I482 Chronic atrial fibrillation: Secondary | ICD-10-CM | POA: Diagnosis not present

## 2015-10-01 DIAGNOSIS — J209 Acute bronchitis, unspecified: Secondary | ICD-10-CM | POA: Diagnosis not present

## 2015-10-01 DIAGNOSIS — I10 Essential (primary) hypertension: Secondary | ICD-10-CM | POA: Diagnosis not present

## 2015-10-05 ENCOUNTER — Ambulatory Visit (INDEPENDENT_AMBULATORY_CARE_PROVIDER_SITE_OTHER): Payer: Medicare Other | Admitting: Pharmacist Clinician (PhC)/ Clinical Pharmacy Specialist

## 2015-10-05 DIAGNOSIS — I482 Chronic atrial fibrillation, unspecified: Secondary | ICD-10-CM

## 2015-10-05 DIAGNOSIS — Z952 Presence of prosthetic heart valve: Secondary | ICD-10-CM

## 2015-10-05 DIAGNOSIS — I4891 Unspecified atrial fibrillation: Secondary | ICD-10-CM

## 2015-10-05 DIAGNOSIS — Z954 Presence of other heart-valve replacement: Secondary | ICD-10-CM | POA: Diagnosis not present

## 2015-10-05 DIAGNOSIS — Z7901 Long term (current) use of anticoagulants: Secondary | ICD-10-CM

## 2015-10-05 LAB — POCT INR: INR: 2.2

## 2015-10-17 ENCOUNTER — Other Ambulatory Visit: Payer: Self-pay | Admitting: Cardiovascular Disease

## 2015-10-17 MED ORDER — LISINOPRIL 30 MG PO TABS: 30.0000 mg | ORAL_TABLET | Freq: Every day | ORAL | Status: DC

## 2015-11-02 ENCOUNTER — Ambulatory Visit (INDEPENDENT_AMBULATORY_CARE_PROVIDER_SITE_OTHER): Payer: Medicare Other | Admitting: Pharmacist Clinician (PhC)/ Clinical Pharmacy Specialist

## 2015-11-02 DIAGNOSIS — Z954 Presence of other heart-valve replacement: Secondary | ICD-10-CM | POA: Diagnosis not present

## 2015-11-02 DIAGNOSIS — I4891 Unspecified atrial fibrillation: Secondary | ICD-10-CM

## 2015-11-02 DIAGNOSIS — Z7901 Long term (current) use of anticoagulants: Secondary | ICD-10-CM

## 2015-11-02 DIAGNOSIS — Z952 Presence of prosthetic heart valve: Secondary | ICD-10-CM

## 2015-11-02 DIAGNOSIS — I482 Chronic atrial fibrillation, unspecified: Secondary | ICD-10-CM

## 2015-11-02 LAB — POCT INR: INR: 1.8

## 2015-11-04 DIAGNOSIS — G40309 Generalized idiopathic epilepsy and epileptic syndromes, not intractable, without status epilepticus: Secondary | ICD-10-CM | POA: Diagnosis not present

## 2015-11-04 DIAGNOSIS — E782 Mixed hyperlipidemia: Secondary | ICD-10-CM | POA: Diagnosis not present

## 2015-11-04 DIAGNOSIS — D649 Anemia, unspecified: Secondary | ICD-10-CM | POA: Diagnosis not present

## 2015-11-04 DIAGNOSIS — I10 Essential (primary) hypertension: Secondary | ICD-10-CM | POA: Diagnosis not present

## 2015-11-04 DIAGNOSIS — E039 Hypothyroidism, unspecified: Secondary | ICD-10-CM | POA: Diagnosis not present

## 2015-11-11 DIAGNOSIS — I10 Essential (primary) hypertension: Secondary | ICD-10-CM | POA: Diagnosis not present

## 2015-11-11 DIAGNOSIS — E039 Hypothyroidism, unspecified: Secondary | ICD-10-CM | POA: Diagnosis not present

## 2015-11-11 DIAGNOSIS — I482 Chronic atrial fibrillation: Secondary | ICD-10-CM | POA: Diagnosis not present

## 2015-11-11 DIAGNOSIS — I639 Cerebral infarction, unspecified: Secondary | ICD-10-CM | POA: Diagnosis not present

## 2015-11-11 DIAGNOSIS — E782 Mixed hyperlipidemia: Secondary | ICD-10-CM | POA: Diagnosis not present

## 2015-11-25 ENCOUNTER — Ambulatory Visit (INDEPENDENT_AMBULATORY_CARE_PROVIDER_SITE_OTHER): Payer: Medicare Other | Admitting: Pharmacist Clinician (PhC)/ Clinical Pharmacy Specialist

## 2015-11-25 DIAGNOSIS — I482 Chronic atrial fibrillation, unspecified: Secondary | ICD-10-CM

## 2015-11-25 DIAGNOSIS — I4891 Unspecified atrial fibrillation: Secondary | ICD-10-CM | POA: Diagnosis not present

## 2015-11-25 DIAGNOSIS — Z954 Presence of other heart-valve replacement: Secondary | ICD-10-CM

## 2015-11-25 DIAGNOSIS — Z952 Presence of prosthetic heart valve: Secondary | ICD-10-CM

## 2015-11-25 DIAGNOSIS — Z7901 Long term (current) use of anticoagulants: Secondary | ICD-10-CM | POA: Diagnosis not present

## 2015-11-25 LAB — POCT INR: INR: 1.9

## 2015-11-28 ENCOUNTER — Encounter: Payer: Self-pay | Admitting: Gastroenterology

## 2015-12-13 ENCOUNTER — Encounter: Payer: Self-pay | Admitting: Cardiovascular Disease

## 2015-12-13 ENCOUNTER — Ambulatory Visit (INDEPENDENT_AMBULATORY_CARE_PROVIDER_SITE_OTHER): Payer: Medicare Other | Admitting: Cardiovascular Disease

## 2015-12-13 ENCOUNTER — Ambulatory Visit (INDEPENDENT_AMBULATORY_CARE_PROVIDER_SITE_OTHER): Payer: Medicare Other | Admitting: Pharmacist Clinician (PhC)/ Clinical Pharmacy Specialist

## 2015-12-13 VITALS — BP 150/80 | HR 80 | Ht 63.0 in | Wt 145.6 lb

## 2015-12-13 DIAGNOSIS — I482 Chronic atrial fibrillation, unspecified: Secondary | ICD-10-CM

## 2015-12-13 DIAGNOSIS — Z954 Presence of other heart-valve replacement: Secondary | ICD-10-CM

## 2015-12-13 DIAGNOSIS — I4891 Unspecified atrial fibrillation: Secondary | ICD-10-CM

## 2015-12-13 DIAGNOSIS — Z950 Presence of cardiac pacemaker: Secondary | ICD-10-CM | POA: Diagnosis not present

## 2015-12-13 DIAGNOSIS — Z952 Presence of prosthetic heart valve: Secondary | ICD-10-CM

## 2015-12-13 DIAGNOSIS — Z7901 Long term (current) use of anticoagulants: Secondary | ICD-10-CM

## 2015-12-13 DIAGNOSIS — I10 Essential (primary) hypertension: Secondary | ICD-10-CM

## 2015-12-13 DIAGNOSIS — E785 Hyperlipidemia, unspecified: Secondary | ICD-10-CM

## 2015-12-13 LAB — POCT INR: INR: 1.5

## 2015-12-13 MED ORDER — CHLORTHALIDONE 25 MG PO TABS: 12.5000 mg | ORAL_TABLET | Freq: Every day | ORAL | Status: DC

## 2015-12-13 NOTE — Patient Instructions (Signed)
Your physician has recommended you make the following change in your medication:   STOP HCTZ (HYDROCHLOROTHIAZIDE) .  START CHLORTHALIDONE 25 MG TABLETS ----- TAKE 1/2 TABLET DAILY.  Your physician has requested that you have an echocardiogram. Echocardiography is a painless test that uses sound waves to create images of your heart. It provides your doctor with information about the size and shape of your heart and how well your heart's chambers and valves are working. This procedure takes approximately one hour. There are no restrictions for this procedure.    Remote monitoring is used to monitor your Pacemaker from home. This monitoring reduces the number of office visits required to check your device to one time per year. It allows [place] to monitor the functioning of your device to ensure it is working properly. You are scheduled for a device check from home on Mar 12, 2016. You may send your transmission at any time that day. If you have a wireless device, the transmission will be sent automatically. After your physician reviews your transmission, you will receive a postcard with your next transmission date.  Dr. Sallyanne Kuster recommends that you schedule a follow-up appointment in: [place] ([facility]).

## 2015-12-13 NOTE — Progress Notes (Signed)
Patient ID: Alice Ballard, female   DOB: 1941/07/11, 75 y.o.   MRN: [id number]    Cardiology Office Note    Date:  12/13/2015   ID:  Alice Ballard, DOB 05-21-1941, MRN [id number]  PCP:  Curlene Labrum, MD  Cardiologist:   Sanda Klein, MD   Chief Complaint  Patient presents with  . Annual Exam    has had chest pain, no shortness of breath, has edema, has pain or cramping in legs, occassional lightheadedness or dizziness    History of Present Illness:  Alice Ballard is a 75 y.o. 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 She does not have any cardiac complaints since her last appointment and specifically denies dyspnea, syncope, palpitations or serious bleeding problems. She does not have a history of embolic events, stroke/TIA. She fell in her home a few weeks ago onto her left shoulder and was afraid that she injured her pacemaker. She did not have serious bleeding or any large hematoma. She still has some discomfort in her left sided ribs. She has occasional ankle edema, always limited to the right side. She has not taken hydrochlorothiazide in a year.  She thinks she will need extraction of 6 mandibular teeth.  Alice Ballard is a 75 year old woman with a mechanical aortic and mitral valve prostheses as well as recurrent persistent atrial fibrillation, hypertension, dyslipidemia. In 1986 for mitral valve was replaced with a 29 mm St. Jude prosthesis for rheumatic disease. In 1994 her aortic valve was replaced with a 19 mm St. Jude device. Has chronic moderate elevation in the valvular gradients across the aortic valve, likely an expression of patient-valve mismatch. Fluoroscopy has shown normal disc motion. Previous right heart catheterization had shown only very mild pulmonary hypertension (mean PA pressure 25 mm Hg,  wedge pressure 16 mm Hg) She has no history of coronary artery disease. She has a dual-chamber permanent pacemaker (Medtronic) that was implanted in 2011 for tachycardia-bradycardia syndrome. She is on chronic warfarin anticoagulation both for her mechanical valves and for atrial fibrillation. She seems to now have permanent atrial fibrillation, with slow ventricular rate, virtually 100% V paced. She has not had bleeding complications or embolic events. She does not have a history of stroke.  Past Medical History  Diagnosis Date  . UTI (urinary tract infection)   . Sick sinus syndrome ([facility])   . Paroxysmal atrial fibrillation ([facility])   . Hypertension   . Seizure disorder ([place])   . Coronary artery disease   . Hyperlipidemia   . Cerebral vascular disease   . GERD (gastroesophageal reflux disease)   . Tubular adenoma of colon 11/2010  . Hemorrhoids   . Hiatal hernia   . Fatty liver   . Spinal stenosis   . Gastric polyp   . Pacemaker july 2011    medtronic adapta model # ADDRL 1,SERIAL [id number]    Past Surgical History  Procedure Laterality Date  . Pacemaker placement  05/2010  . Aortic and mitral valve replacement  1994    31mm St. Jude  . Mitral valve replacement  1986    St Jude prosthesis   . Cardiac catheterization  04/2010  . Back surgery    . Tee without cardioversion  07/11/2012    Procedure: TRANSESOPHAGEAL ECHOCARDIOGRAM (TEE);  Surgeon: Sanda Klein, MD;  Location: [facility];  Service: Cardiovascular;  Laterality: N/A;  . Doppler  echocardiography  sept 06, 2013    aotric valve grad. peak 51 mmHg and mean 28 mm Hg  dimensional subtractive index 0.26; mitral valve grad peak 25 and mean 71mm Hg norm pressure halftime 54msec.  [name] Nm myocar perf wall motion  03/31/2008    EF 69% low risk  . Nm myocar perf wall motion  04/19/2006    EF 69%    Outpatient Prescriptions Prior to Visit  Medication Sig Dispense Refill  . acetaminophen (TYLENOL) 500 MG tablet Take 1,000 mg by mouth  every 6 (six) hours as needed. For pain    . alendronate (FOSAMAX) 70 MG tablet Take 70 mg by mouth every 7 (seven) days. Take with a full glass of water on an empty stomach. Takes on Sunday.    . Ascorbic Acid (VITAMIN C) 1000 MG tablet Take 1,000 mg by mouth daily.    [name] atorvastatin (LIPITOR) 80 MG tablet Take 0.5 tablets (40 mg total) by mouth at bedtime. 15 tablet 3  . Cholecalciferol (VITAMIN D3) 2000 UNITS capsule Take 2,000 Units by mouth daily.    . fluticasone (FLONASE) 50 MCG/ACT nasal spray Place 2 sprays into both nostrils 2 (two) times daily.    . folic acid (FOLVITE) 1 MG tablet Take 1 tablet (1 mg total) by mouth daily. 30 tablet 3  . hydrochlorothiazide (HYDRODIURIL) 25 MG tablet Take 1 tablet (25 mg total) by mouth daily. 30 tablet 5  . levETIRAcetam (KEPPRA) 500 MG tablet Take 500 mg by mouth 2 (two) times daily.    [name] lisinopril (PRINIVIL,ZESTRIL) 30 MG tablet Take 1 tablet (30 mg total) by mouth daily. 30 tablet 2  . omeprazole (PRILOSEC) 20 MG capsule Take 20 mg by mouth daily.    . pantoprazole (PROTONIX) 40 MG tablet Take 40 mg by mouth daily as needed. For heartburn    . Polyethyl Glycol-Propyl Glycol (SYSTANE OP) Place 1 drop into both eyes daily as needed. For dry eyes    . warfarin (COUMADIN) 2.5 MG tablet TAKE 2 TABLETS BY MOUTH DAILY OR AS DIRECTED BY COUMADIN CLINIC. 60 tablet 3  . ferrous sulfate 325 (65 FE) MG tablet Take 1 tablet (325 mg total) by mouth daily. 30 tablet 3   No facility-administered medications prior to visit.     Allergies:   Tegretol and Shellfish allergy   Social History   Social History  . Marital Status: Married    Spouse Name: N/A  . Number of Children: N/A  . Years of Education: N/A   Occupational History  . DISABLED    Social History Main Topics  . Smoking status: Never Smoker   . Smokeless tobacco: Never Used  . Alcohol Use: No  . Drug Use: No  . Sexual Activity: Not Asked   Other Topics Concern  . None   Social  History Narrative     Family History:  The patient's family history includes Colon cancer in her sister; Colon polyps in her sister; Heart disease in her mother.   ROS:   Please see the history of present illness.    ROS All other systems reviewed and are negative.   PHYSICAL EXAM:   VS:  BP 150/80 mmHg  Pulse 80  Ht 5\' 3"  (1.6 m)  Wt 66.027 kg (145 lb 9 oz)  BMI 25.79 kg/m2   GEN: Well nourished, well developed, in no acute distress HEENT: normal Neck: no JVD, carotid bruits, or masses Cardiac: RRR; crisp mechanical valve prosthetic clicks, [id number] systolic  ejection murmur in the aortic focus, no diastolic murmurs, rubs, or gallops,no edema  Respiratory:  clear to auscultation bilaterally, normal work of breathing GI: soft, nontender, nondistended, + BS MS: no deformity or atrophy Skin: warm and dry, no rash Neuro:  Alert and Oriented x 3, Strength and sensation are intact Psych: euthymic mood, full affect  Wt Readings from Last 3 Encounters:  12/13/15 66.027 kg (145 lb 9 oz)  12/07/14 63.186 kg (139 lb 4.8 oz)  08/24/14 64.411 kg (142 lb)      Studies/Labs Reviewed:   EKG:  EKG is ordered today.  The ekg ordered today demonstrates background atrial fibrillation and 100% ventricular pacing  Recent Labs: 11/04/2015 Total cholesterol 147, triglycerides 87, HDL 85, LDL 45 Glucose 87, BUN 12, creatinine 0.95, normal LFTs, hemoglobin 11.8, platelets 120 3K, TSH 5.05  ASSESSMENT:    1. Chronic atrial fibrillation ([place])   2. Pacemaker: Medtronic 2011 for tachy brady AFIB   3. Hx of mechanical aortic valve replacement   4. History of mitral valve replacement, mechanical   5. Long term current use of anticoagulant therapy   6. Essential hypertension   7. Hyperlipidemia      PLAN:  In order of problems listed above:  1. Permanent atrial fibrillation: Highly unlikely she will have recurrence of normal sinus rhythm. Extremely high embolic risk due to the presence of  mechanical aortic and mitral valve prostheses. Anticoagulation should not be interrupted likely, and if it is necessary to stop it she should have "bridging" with enoxaparin or heparin. Will coordinate this around the time of her dental extractions once she tells [place] the date for the procedure. 2. PPM: Normal pacemaker function. Nearly 100% ventricular pacing despite the fact that she does not take any AV blocking agents. Estimated generator longevity another 9 years. Excellent lead parameters. She has a dual-chamber that is programmed VVIR for atrial fibrillation. Occasional episodes of high ventricular rate represent 8-10 beats of nonsustained ventricular tachycardia, roughly one episode a month. 3. Mechanical AVR: Some concern of aortic valve mismatch. Repeat echocardiogram 4. Mechanical MVR: Reevaluate at echo. Normal by physical exam. She is aware of the need to take antibiotic prophylaxis before dental procedures or other interventions that can be complicated by bacteremia 5. INR to be checked today. She has had a recent fall but this is an unusual occurrence for her. Needs to continue anticoagulation. 6. HTN: Blood pressure is not well controlled. She thought that the hydrochlorothiazide was causing excessive diuresis and leg cramping. Will substitute with a lower dose of chlorthalidone. 7. HLP: Excellent parameters on most recent lipid profile   Medication Adjustments/Labs and Tests Ordered: Current medicines are reviewed at length with the patient today.  Concerns regarding medicines are outlined above.  Medication changes, Labs and Tests ordered today are listed in the Patient Instructions below. There are no Patient Instructions on file for this visit.     Mikael Spray, MD  12/13/2015 9:09 AM    [facility] [place], [place], [place]  [address] Phone: [phone]; Fax: [phone]

## 2015-12-23 ENCOUNTER — Ambulatory Visit (HOSPITAL_COMMUNITY): Payer: Medicare Other | Attending: Cardiovascular Disease

## 2015-12-23 ENCOUNTER — Other Ambulatory Visit: Payer: Self-pay

## 2015-12-23 DIAGNOSIS — I059 Rheumatic mitral valve disease, unspecified: Secondary | ICD-10-CM | POA: Insufficient documentation

## 2015-12-23 DIAGNOSIS — Z954 Presence of other heart-valve replacement: Secondary | ICD-10-CM | POA: Insufficient documentation

## 2015-12-23 DIAGNOSIS — I119 Hypertensive heart disease without heart failure: Secondary | ICD-10-CM | POA: Insufficient documentation

## 2015-12-23 DIAGNOSIS — Z952 Presence of prosthetic heart valve: Secondary | ICD-10-CM

## 2015-12-23 DIAGNOSIS — I359 Nonrheumatic aortic valve disorder, unspecified: Secondary | ICD-10-CM | POA: Diagnosis not present

## 2015-12-23 DIAGNOSIS — I482 Chronic atrial fibrillation: Secondary | ICD-10-CM | POA: Insufficient documentation

## 2015-12-23 DIAGNOSIS — E785 Hyperlipidemia, unspecified: Secondary | ICD-10-CM | POA: Insufficient documentation

## 2015-12-23 DIAGNOSIS — I071 Rheumatic tricuspid insufficiency: Secondary | ICD-10-CM | POA: Diagnosis not present

## 2015-12-28 ENCOUNTER — Ambulatory Visit (INDEPENDENT_AMBULATORY_CARE_PROVIDER_SITE_OTHER): Payer: Medicare Other | Admitting: Pharmacist Clinician (PhC)/ Clinical Pharmacy Specialist

## 2015-12-28 DIAGNOSIS — I482 Chronic atrial fibrillation, unspecified: Secondary | ICD-10-CM

## 2015-12-28 DIAGNOSIS — Z7901 Long term (current) use of anticoagulants: Secondary | ICD-10-CM | POA: Diagnosis not present

## 2015-12-28 DIAGNOSIS — I4891 Unspecified atrial fibrillation: Secondary | ICD-10-CM | POA: Diagnosis not present

## 2015-12-28 DIAGNOSIS — Z952 Presence of prosthetic heart valve: Secondary | ICD-10-CM

## 2015-12-28 DIAGNOSIS — Z954 Presence of other heart-valve replacement: Secondary | ICD-10-CM

## 2015-12-28 LAB — POCT INR: INR: 2.3

## 2016-01-13 ENCOUNTER — Other Ambulatory Visit: Payer: Self-pay | Admitting: Cardiovascular Disease

## 2016-01-13 NOTE — Telephone Encounter (Signed)
Rx request sent to pharmacy.  

## 2016-01-16 ENCOUNTER — Ambulatory Visit (INDEPENDENT_AMBULATORY_CARE_PROVIDER_SITE_OTHER): Payer: Medicare Other | Admitting: Pharmacist Clinician (PhC)/ Clinical Pharmacy Specialist

## 2016-01-16 DIAGNOSIS — I4891 Unspecified atrial fibrillation: Secondary | ICD-10-CM

## 2016-01-16 DIAGNOSIS — Z954 Presence of other heart-valve replacement: Secondary | ICD-10-CM

## 2016-01-16 DIAGNOSIS — I482 Chronic atrial fibrillation, unspecified: Secondary | ICD-10-CM

## 2016-01-16 DIAGNOSIS — Z7901 Long term (current) use of anticoagulants: Secondary | ICD-10-CM | POA: Diagnosis not present

## 2016-01-16 DIAGNOSIS — Z952 Presence of prosthetic heart valve: Secondary | ICD-10-CM

## 2016-01-16 LAB — POCT INR: INR: 2.2

## 2016-02-07 ENCOUNTER — Other Ambulatory Visit: Payer: Self-pay

## 2016-02-07 DIAGNOSIS — Z1231 Encounter for screening mammogram for malignant neoplasm of breast: Secondary | ICD-10-CM

## 2016-02-13 ENCOUNTER — Ambulatory Visit (INDEPENDENT_AMBULATORY_CARE_PROVIDER_SITE_OTHER): Payer: Medicare Other | Admitting: Pharmacist Clinician (PhC)/ Clinical Pharmacy Specialist

## 2016-02-13 ENCOUNTER — Other Ambulatory Visit: Payer: Self-pay | Admitting: Cardiovascular Disease

## 2016-02-13 DIAGNOSIS — Z952 Presence of prosthetic heart valve: Secondary | ICD-10-CM

## 2016-02-13 DIAGNOSIS — I4891 Unspecified atrial fibrillation: Secondary | ICD-10-CM

## 2016-02-13 DIAGNOSIS — Z954 Presence of other heart-valve replacement: Secondary | ICD-10-CM | POA: Diagnosis not present

## 2016-02-13 DIAGNOSIS — Z7901 Long term (current) use of anticoagulants: Secondary | ICD-10-CM | POA: Diagnosis not present

## 2016-02-13 DIAGNOSIS — I482 Chronic atrial fibrillation, unspecified: Secondary | ICD-10-CM

## 2016-02-13 LAB — POCT INR: INR: 2.8

## 2016-02-16 DIAGNOSIS — E039 Hypothyroidism, unspecified: Secondary | ICD-10-CM | POA: Diagnosis not present

## 2016-02-16 DIAGNOSIS — I482 Chronic atrial fibrillation: Secondary | ICD-10-CM | POA: Diagnosis not present

## 2016-02-16 DIAGNOSIS — E782 Mixed hyperlipidemia: Secondary | ICD-10-CM | POA: Diagnosis not present

## 2016-02-16 DIAGNOSIS — I10 Essential (primary) hypertension: Secondary | ICD-10-CM | POA: Diagnosis not present

## 2016-02-16 DIAGNOSIS — I700 Atherosclerosis of aorta: Secondary | ICD-10-CM | POA: Diagnosis not present

## 2016-03-14 ENCOUNTER — Ambulatory Visit (INDEPENDENT_AMBULATORY_CARE_PROVIDER_SITE_OTHER): Payer: Medicare Other | Admitting: Pharmacist

## 2016-03-14 ENCOUNTER — Ambulatory Visit
Admission: RE | Admit: 2016-03-14 | Discharge: 2016-03-14 | Disposition: A | Discharge status: Home / Self Care | Payer: Medicare Other | Source: Ambulatory Visit

## 2016-03-14 DIAGNOSIS — I4891 Unspecified atrial fibrillation: Secondary | ICD-10-CM

## 2016-03-14 DIAGNOSIS — Z1231 Encounter for screening mammogram for malignant neoplasm of breast: Secondary | ICD-10-CM | POA: Diagnosis not present

## 2016-03-14 DIAGNOSIS — Z7901 Long term (current) use of anticoagulants: Secondary | ICD-10-CM

## 2016-03-14 DIAGNOSIS — Z954 Presence of other heart-valve replacement: Secondary | ICD-10-CM | POA: Diagnosis not present

## 2016-03-14 DIAGNOSIS — I482 Chronic atrial fibrillation, unspecified: Secondary | ICD-10-CM

## 2016-03-14 DIAGNOSIS — Z952 Presence of prosthetic heart valve: Secondary | ICD-10-CM

## 2016-03-14 LAB — POCT INR: INR: 2.5

## 2016-03-24 LAB — CUP PACEART INCLINIC DEVICE CHECK
Date Time Interrogation Session: 20170207140108
Implantable Lead Implant Date: 20110701
Implantable Lead Location: 753859
Lead Channel Impedance Value: 67 Ohm
Lead Channel Setting Pacing Pulse Width: 0.4 ms
Lead Channel Setting Sensing Sensitivity: 2.8 mV
MDC IDC LEAD IMPLANT DT: 20110701
MDC IDC LEAD LOCATION: 753860
MDC IDC MSMT BATTERY IMPEDANCE: 279 Ohm
MDC IDC MSMT BATTERY REMAINING LONGEVITY: 109 mo
MDC IDC MSMT BATTERY VOLTAGE: 2.79 V
MDC IDC MSMT LEADCHNL RV IMPEDANCE VALUE: 498 Ohm
MDC IDC SET LEADCHNL RV PACING AMPLITUDE: 2.5 V
MDC IDC STAT BRADY RV PERCENT PACED: 99 %

## 2016-03-27 ENCOUNTER — Encounter: Payer: Medicare Other | Admitting: *Deleted

## 2016-03-27 ENCOUNTER — Telehealth: Payer: Self-pay | Admitting: Cardiology

## 2016-03-27 NOTE — Telephone Encounter (Signed)
LMOVM reminding pt to send remote transmission.   

## 2016-03-30 ENCOUNTER — Encounter: Payer: Self-pay | Admitting: Cardiology

## 2016-04-06 ENCOUNTER — Ambulatory Visit (INDEPENDENT_AMBULATORY_CARE_PROVIDER_SITE_OTHER): Payer: Medicare Other | Admitting: *Deleted

## 2016-04-06 DIAGNOSIS — I481 Persistent atrial fibrillation: Secondary | ICD-10-CM

## 2016-04-06 DIAGNOSIS — I4819 Other persistent atrial fibrillation: Secondary | ICD-10-CM

## 2016-04-09 NOTE — Progress Notes (Signed)
Remote pacemaker transmission.   

## 2016-04-11 ENCOUNTER — Ambulatory Visit (INDEPENDENT_AMBULATORY_CARE_PROVIDER_SITE_OTHER): Payer: Medicare Other | Admitting: Pharmacist Clinician (PhC)/ Clinical Pharmacy Specialist

## 2016-04-11 ENCOUNTER — Encounter: Payer: Self-pay | Admitting: Cardiology

## 2016-04-11 DIAGNOSIS — Z954 Presence of other heart-valve replacement: Secondary | ICD-10-CM

## 2016-04-11 DIAGNOSIS — I481 Persistent atrial fibrillation: Secondary | ICD-10-CM

## 2016-04-11 DIAGNOSIS — Z7901 Long term (current) use of anticoagulants: Secondary | ICD-10-CM | POA: Diagnosis not present

## 2016-04-11 DIAGNOSIS — I4891 Unspecified atrial fibrillation: Secondary | ICD-10-CM | POA: Diagnosis not present

## 2016-04-11 DIAGNOSIS — I4819 Other persistent atrial fibrillation: Secondary | ICD-10-CM

## 2016-04-11 DIAGNOSIS — Z952 Presence of prosthetic heart valve: Secondary | ICD-10-CM

## 2016-04-11 LAB — POCT INR: INR: 2.4

## 2016-04-19 LAB — CUP PACEART REMOTE DEVICE CHECK
Battery Impedance: 351 Ohm
Battery Remaining Longevity: 101 mo
Battery Voltage: 2.79 V
Brady Statistic RV Percent Paced: 99 %
Implantable Lead Implant Date: 20110701
Implantable Lead Location: 753859
Implantable Lead Location: 753860
Implantable Lead Model: 5076
Implantable Lead Model: 5076
Lead Channel Impedance Value: 497 Ohm
Lead Channel Impedance Value: 67 Ohm
MDC IDC LEAD IMPLANT DT: 20110701
MDC IDC SESS DTM: 20170602183804
MDC IDC SET LEADCHNL RV PACING AMPLITUDE: 2.5 V
MDC IDC SET LEADCHNL RV PACING PULSEWIDTH: 0.4 ms
MDC IDC SET LEADCHNL RV SENSING SENSITIVITY: 2.8 mV

## 2016-04-25 ENCOUNTER — Encounter: Payer: Self-pay | Admitting: Cardiology

## 2016-05-09 ENCOUNTER — Ambulatory Visit (INDEPENDENT_AMBULATORY_CARE_PROVIDER_SITE_OTHER): Payer: Medicare Other | Admitting: Pharmacist

## 2016-05-09 DIAGNOSIS — I4819 Other persistent atrial fibrillation: Secondary | ICD-10-CM

## 2016-05-09 DIAGNOSIS — Z954 Presence of other heart-valve replacement: Secondary | ICD-10-CM

## 2016-05-09 DIAGNOSIS — I4891 Unspecified atrial fibrillation: Secondary | ICD-10-CM | POA: Diagnosis not present

## 2016-05-09 DIAGNOSIS — Z7901 Long term (current) use of anticoagulants: Secondary | ICD-10-CM

## 2016-05-09 DIAGNOSIS — I481 Persistent atrial fibrillation: Secondary | ICD-10-CM | POA: Diagnosis not present

## 2016-05-09 DIAGNOSIS — Z952 Presence of prosthetic heart valve: Secondary | ICD-10-CM

## 2016-05-09 LAB — POCT INR: INR: 2.6

## 2016-05-24 ENCOUNTER — Encounter: Payer: Self-pay | Admitting: Cardiovascular Disease

## 2016-05-24 DIAGNOSIS — D649 Anemia, unspecified: Secondary | ICD-10-CM | POA: Diagnosis not present

## 2016-05-24 DIAGNOSIS — E039 Hypothyroidism, unspecified: Secondary | ICD-10-CM | POA: Diagnosis not present

## 2016-05-24 DIAGNOSIS — I10 Essential (primary) hypertension: Secondary | ICD-10-CM | POA: Diagnosis not present

## 2016-05-24 DIAGNOSIS — E782 Mixed hyperlipidemia: Secondary | ICD-10-CM | POA: Diagnosis not present

## 2016-05-24 DIAGNOSIS — K219 Gastro-esophageal reflux disease without esophagitis: Secondary | ICD-10-CM | POA: Diagnosis not present

## 2016-05-29 DIAGNOSIS — E039 Hypothyroidism, unspecified: Secondary | ICD-10-CM | POA: Diagnosis not present

## 2016-05-29 DIAGNOSIS — D649 Anemia, unspecified: Secondary | ICD-10-CM | POA: Diagnosis not present

## 2016-05-29 DIAGNOSIS — E782 Mixed hyperlipidemia: Secondary | ICD-10-CM | POA: Diagnosis not present

## 2016-05-29 DIAGNOSIS — I482 Chronic atrial fibrillation: Secondary | ICD-10-CM | POA: Diagnosis not present

## 2016-05-29 DIAGNOSIS — I10 Essential (primary) hypertension: Secondary | ICD-10-CM | POA: Diagnosis not present

## 2016-06-12 ENCOUNTER — Ambulatory Visit (INDEPENDENT_AMBULATORY_CARE_PROVIDER_SITE_OTHER): Payer: Medicare Other | Admitting: Cardiovascular Disease

## 2016-06-12 ENCOUNTER — Encounter (INDEPENDENT_AMBULATORY_CARE_PROVIDER_SITE_OTHER): Payer: Self-pay

## 2016-06-12 ENCOUNTER — Encounter: Payer: Self-pay | Admitting: Cardiovascular Disease

## 2016-06-12 ENCOUNTER — Ambulatory Visit (INDEPENDENT_AMBULATORY_CARE_PROVIDER_SITE_OTHER): Payer: Medicare Other | Admitting: Pharmacist Clinician (PhC)/ Clinical Pharmacy Specialist

## 2016-06-12 VITALS — BP 140/78 | HR 73 | Ht 60.0 in | Wt 150.0 lb

## 2016-06-12 DIAGNOSIS — Z954 Presence of other heart-valve replacement: Secondary | ICD-10-CM | POA: Diagnosis not present

## 2016-06-12 DIAGNOSIS — Z8669 Personal history of other diseases of the nervous system and sense organs: Secondary | ICD-10-CM

## 2016-06-12 DIAGNOSIS — Z7901 Long term (current) use of anticoagulants: Secondary | ICD-10-CM

## 2016-06-12 DIAGNOSIS — Z950 Presence of cardiac pacemaker: Secondary | ICD-10-CM

## 2016-06-12 DIAGNOSIS — I481 Persistent atrial fibrillation: Secondary | ICD-10-CM

## 2016-06-12 DIAGNOSIS — I10 Essential (primary) hypertension: Secondary | ICD-10-CM

## 2016-06-12 DIAGNOSIS — G4710 Hypersomnia, unspecified: Secondary | ICD-10-CM

## 2016-06-12 DIAGNOSIS — Z952 Presence of prosthetic heart valve: Secondary | ICD-10-CM

## 2016-06-12 DIAGNOSIS — I4819 Other persistent atrial fibrillation: Secondary | ICD-10-CM

## 2016-06-12 DIAGNOSIS — I4891 Unspecified atrial fibrillation: Secondary | ICD-10-CM | POA: Diagnosis not present

## 2016-06-12 DIAGNOSIS — Z87898 Personal history of other specified conditions: Secondary | ICD-10-CM

## 2016-06-12 DIAGNOSIS — Z9181 History of falling: Secondary | ICD-10-CM

## 2016-06-12 DIAGNOSIS — E785 Hyperlipidemia, unspecified: Secondary | ICD-10-CM

## 2016-06-12 LAB — POCT INR: INR: 1.7

## 2016-06-12 NOTE — Progress Notes (Signed)
Patient ID: Alice Ballard, female   DOB: 24-Mar-1941, 75 y.o.   MRN: [id number]    Cardiology Office Note    Date:  06/12/2016   ID:  AZARYAH MOREIN, DOB 1941/05/25, MRN [id number]  PCP:  Curlene Labrum, MD  Cardiologist:   Sanda Klein, MD   Chief Complaint  Patient presents with  . Follow-up    6 MONTHS    History of Present Illness:  Alice Ballard is a 75 y.o. 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   She does not have any cardiac complaints since her last appointment and specifically denies dyspnea, syncope, palpitations or serious bleeding problems. She does not have a history of embolic events, stroke/TIA.   She fell in her garden 2 weeks ago onto her left shoulder and hit her left orbital area against the grass, developing a small ecchymosis. She has not had any focal neurological complaints. She did not have serious bleeding or any large hematoma. She still has some discomfort in her left sided ribs.   She has not had any seizures. She does have daytime hypersomnolence, falling asleep 3-4 times during the day. She has had 2 or 3 sleep studies in the past that did not show evidence of obstructive sleep apnea. She continues to take Keppra, but has not seen a neurologist since Dr. Erling Cruz retired  She no longer has problems with ankle edema and seems to be tolerating chlorthalidone well.  She thinks she will need extraction of 6 mandibular teeth are still looking for a dentist do it.  Remote pacemaker download in June showed 100% ventricular pacing and normal device function. There was no evidence of high ventricular rates. A cardiogram in February 2017 showed no change in the aortic valve gradients which are only moderately elevated.  Alice Ballard is a 75 year old woman with a mechanical aortic and mitral  valve prostheses as well as recurrent persistent atrial fibrillation, hypertension, dyslipidemia. In 1986 for mitral valve was replaced with a 29 mm St. Jude prosthesis for rheumatic disease. In 1994 her aortic valve was replaced with a 19 mm St. Jude device. Has chronic moderate elevation in the valvular gradients across the aortic valve, likely an expression of patient-valve mismatch. Fluoroscopy has shown normal disc motion. Previous right heart catheterization had shown only very mild pulmonary hypertension (mean PA pressure 25 mm Hg, wedge pressure 16 mm Hg) She has no history of coronary artery disease. She has a dual-chamber permanent pacemaker (Medtronic) that was implanted in 2011 for tachycardia-bradycardia syndrome. She is on chronic warfarin anticoagulation both for her mechanical valves and for atrial fibrillation. She seems to now have permanent atrial fibrillation, with slow ventricular rate, virtually 100% V paced. She has not had bleeding complications or embolic events. She does not have a history of stroke.  Past Medical History:  Diagnosis Date  . Cerebral vascular disease   . Coronary artery disease   . Fatty liver   . Gastric polyp   . GERD (gastroesophageal reflux disease)   . Hemorrhoids   . Hiatal hernia   . Hyperlipidemia   . Hypertension   . Pacemaker july 2011   medtronic adapta model # ADDRL 1,SERIAL [id number]  . Paroxysmal atrial fibrillation (HCC)   . Seizure disorder ([place])   . Sick sinus syndrome ([place])   . Spinal stenosis   . Tubular adenoma of colon  11/2010  . UTI (urinary tract infection)     Past Surgical History:  Procedure Laterality Date  . AORTIC AND MITRAL VALVE REPLACEMENT  1994   33mm St. Jude  . BACK SURGERY    . CARDIAC CATHETERIZATION  04/2010  . DOPPLER ECHOCARDIOGRAPHY  sept 06, 2013   aotric valve grad. peak 51 mmHg and mean 28 mm Hg  dimensional subtractive index 0.26; mitral valve grad peak 25 and mean 11mm Hg norm pressure halftime 46msec.    [name] MITRAL VALVE REPLACEMENT  1986   St Jude prosthesis   . NM MYOCAR PERF WALL MOTION  03/31/2008   EF 69% low risk  . NM MYOCAR PERF WALL MOTION  04/19/2006   EF 69%  . PACEMAKER PLACEMENT  05/2010  . TEE WITHOUT CARDIOVERSION  07/11/2012   Procedure: TRANSESOPHAGEAL ECHOCARDIOGRAM (TEE);  Surgeon: Sanda Klein, MD;  Location: [facility] ENDOSCOPY;  Service: Cardiovascular;  Laterality: N/A;    Outpatient Medications Prior to Visit  Medication Sig Dispense Refill  . acetaminophen (TYLENOL) 500 MG tablet Take 1,000 mg by mouth every 6 (six) hours as needed. For pain    . alendronate (FOSAMAX) 70 MG tablet Take 70 mg by mouth every 7 (seven) days. Take with a full glass of water on an empty stomach. Takes on Sunday.    . Ascorbic Acid (VITAMIN C) 1000 MG tablet Take 1,000 mg by mouth daily.    [name] atorvastatin (LIPITOR) 80 MG tablet TAKE 1/2 TABLET BY MOUTH AT BEDTIME 15 tablet 5  . chlorthalidone (HYGROTON) 25 MG tablet Take 0.5 tablets (12.5 mg total) by mouth daily. 45 tablet 3  . Cholecalciferol (VITAMIN D3) 2000 UNITS capsule Take 2,000 Units by mouth daily.    . fluticasone (FLONASE) 50 MCG/ACT nasal spray Place 2 sprays into both nostrils 2 (two) times daily.    . folic acid (FOLVITE) 1 MG tablet TAKE 1 TABLET BY MOUTH EVERY DAY 30 tablet 5  . levETIRAcetam (KEPPRA) 500 MG tablet Take 500 mg by mouth 2 (two) times daily.    [name] lisinopril (PRINIVIL,ZESTRIL) 30 MG tablet TAKE 1 TABLET BY MOUTH EVERY DAY 30 tablet 5  . omeprazole (PRILOSEC) 20 MG capsule Take 20 mg by mouth daily.    . pantoprazole (PROTONIX) 40 MG tablet Take 40 mg by mouth daily as needed. For heartburn    . Polyethyl Glycol-Propyl Glycol (SYSTANE OP) Place 1 drop into both eyes daily as needed. For dry eyes    . warfarin (COUMADIN) 2.5 MG tablet TAKE 2 TABLETS BY MOUTH EVERY DAY OR AS DIRECTED BY COUMADIN CLINIC 60 tablet 5   No facility-administered medications prior to visit.      Allergies:   Tegretol [carbamazepine] and  Shellfish allergy   Social History   Social History  . Marital status: Married    Spouse name: N/A  . Number of children: N/A  . Years of education: N/A   Occupational History  . DISABLED    Social History Main Topics  . Smoking status: Never Smoker  . Smokeless tobacco: Never Used  . Alcohol use No  . Drug use: No  . Sexual activity: Not Asked   Other Topics Concern  . None   Social History Narrative  . None     Family History:  The patient's family history includes Colon cancer in her sister; Colon polyps in her sister; Heart disease in her mother.   ROS:   Please see the history of present illness.  ROS All other systems reviewed and are negative.   PHYSICAL EXAM:   VS:  BP 140/78   Pulse 73   Ht 5' (1.524 m)   Wt 150 lb (68 kg)   BMI 29.29 kg/m    GEN: Well nourished, well developed, in no acute distress HEENT: normal Neck: no JVD, carotid bruits, or masses Cardiac: RRR; crisp mechanical valve prosthetic clicks, [id number] systolic ejection murmur in the aortic focus, no diastolic murmurs, rubs, or gallops,no edema  Respiratory:  clear to auscultation bilaterally, normal work of breathing GI: soft, nontender, nondistended, + BS MS: no deformity or atrophy Skin: warm and dry, no rash Neuro:  Alert and Oriented x 3, Strength and sensation are intact Psych: euthymic mood, full affect  Wt Readings from Last 3 Encounters:  06/12/16 150 lb (68 kg)  12/13/15 145 lb 9 oz (66 kg)  12/07/14 139 lb 4.8 oz (63.2 kg)      Studies/Labs Reviewed:   EKG:  EKG is ordered today.  The ekg ordered today demonstrates background atrial fibrillation and 100% ventricular pacing  Recent Labs: 11/04/2015 Total cholesterol 147, triglycerides 87, HDL 85, LDL 45 Glucose 87, BUN 12, creatinine 0.95, normal LFTs, hemoglobin 11.8, platelets 120 3K, TSH 5.05  ASSESSMENT:    1. Persistent atrial fibrillation ([place])   2. Pacemaker: Medtronic 2011 for tachy brady AFIB   3. Hx of  mechanical aortic valve replacement   4. History of mitral valve replacement, mechanical   5. Long term current use of anticoagulant therapy   6. Essential hypertension   7. Hyperlipidemia   8. History of fall   9. Hypersomnolence   10. History of seizures      PLAN:  In order of problems listed above:  1. Permanent atrial fibrillation: Highly unlikely she will have recurrence of normal sinus rhythm. Extremely high embolic risk due to the presence of mechanical aortic and mitral valve prostheses. Anticoagulation should not be interrupted lightely, and if it is necessary to stop it she should have "bridging" with enoxaparin or heparin.  2. PPM: Normal pacemaker function. Nearly 100% ventricular pacing despite the fact that she does not take any AV blocking agents. Estimated generator longevity another 9 years. Excellent lead parameters. She has a dual-chamber that is programmed VVIR for atrial fibrillation. Occasional episodes of high ventricular rate represent 8-10 beats of nonsustained ventricular tachycardia, roughly one episode a month, None recorded on her most recent download. 3. Mechanical AVR: Some concern of aortic valve mismatch. Despite small prosthetic valve size area valve gradients are actually fairly low (peak 30, mean 18) and stable over time. The dimensionless obstructive index is low at 0.23, consistent with moderate to severe obstruction. 4. Mechanical MVR: Reevaluate at echo. Normal by physical exam. She is aware of the need to take antibiotic prophylaxis before dental procedures or other interventions that can be complicated by bacteremia 5. Warfarin: INR to be checked today. She has had a recent fall but this is an unusual occurrence for her. Needs to continue anticoagulation. 6. HTN: Blood pressure is now well controlled. Tolerating diuresis better that she is now on chlorthalidone. 7. HLP: Excellent parameters on most recent lipid profile 8. Falls: seem to be related to  balance and weakness problems. The risk of serious complications with head injury is very high. I think it is important that she follow up with a neurologist for her history of seizures. She may need additional evaluation for hypersomnolence, which does not appear to be explained by  sleep apnea.   Medication Adjustments/Labs and Tests Ordered: Current medicines are reviewed at length with the patient today.  Concerns regarding medicines are outlined above.  Medication changes, Labs and Tests ordered today are listed in the Patient Instructions below. Patient Instructions  You have been referred to Dr Jannifer Franklin at [facility].  Remote monitoring is used to monitor your Pacemaker of ICD from home. This monitoring reduces the number of office visits required to check your device to one time per year. It allows [place] to keep an eye on the functioning of your device to ensure it is working properly. You are scheduled for a device check from home on Friday, September 1st, 2017. You may send your transmission at any time that day. If you have a wireless device, the transmission will be sent automatically. After your physician reviews your transmission, you will receive a postcard with your next transmission date.  Dr Sallyanne Kuster recommends that you schedule a follow-up appointment in 4 months (December 2017).        Signed, Sanda Klein, MD  06/12/2016 9:11 AM    [facility] [address], [place], [place]  [address] Phone: [phone]; Fax: [phone]

## 2016-06-12 NOTE — Patient Instructions (Signed)
You have been referred to Dr Jannifer Franklin at [facility].  Remote monitoring is used to monitor your Pacemaker of ICD from home. This monitoring reduces the number of office visits required to check your device to one time per year. It allows [place] to keep an eye on the functioning of your device to ensure it is working properly. You are scheduled for a device check from home on Friday, September 1st, 2017. You may send your transmission at any time that day. If you have a wireless device, the transmission will be sent automatically. After your physician reviews your transmission, you will receive a postcard with your next transmission date.  Dr Sallyanne Kuster recommends that you schedule a follow-up appointment in 4 months (December 2017).

## 2016-07-04 ENCOUNTER — Encounter: Payer: Self-pay | Admitting: Neurology

## 2016-07-04 ENCOUNTER — Ambulatory Visit (INDEPENDENT_AMBULATORY_CARE_PROVIDER_SITE_OTHER): Payer: Medicare Other | Admitting: Neurology

## 2016-07-04 VITALS — BP 146/91 | HR 92 | Ht 60.0 in | Wt 149.5 lb

## 2016-07-04 DIAGNOSIS — E538 Deficiency of other specified B group vitamins: Secondary | ICD-10-CM

## 2016-07-04 DIAGNOSIS — R569 Unspecified convulsions: Secondary | ICD-10-CM

## 2016-07-04 DIAGNOSIS — R269 Unspecified abnormalities of gait and mobility: Secondary | ICD-10-CM | POA: Insufficient documentation

## 2016-07-04 DIAGNOSIS — G4719 Other hypersomnia: Secondary | ICD-10-CM | POA: Diagnosis not present

## 2016-07-04 HISTORY — DX: Unspecified abnormalities of gait and mobility: R26.9

## 2016-07-04 HISTORY — DX: Other hypersomnia: G47.19

## 2016-07-04 MED ORDER — LAMOTRIGINE 25 MG PO TABS: ORAL_TABLET | ORAL | 1 refills | Status: DC

## 2016-07-04 NOTE — Patient Instructions (Addendum)
We will get blood work today and get a CT of the head. Physical therapy will be set up for walking safety.  We will try switching to Lamictal to see if the drowsiness can be improved.  Once you are on the Lamictal 25 mg tablets taking 3 twice a day for 2 weeks, call our office for instructions on how to come off of the [place].   Lamictal (lamotrigine) is a seizure medication that occasionally may be used for other purposes such as peripheral neuropathy pain or certain types of headache. This medication is relatively safe, but occasionally side effects can occur. A skin rash may occur when first starting the medication. As with any seizure medication, depression may worsen on the drug. Other potential side effects include dizziness, headache, drowsiness or insomnia, decreased concentration, or stomach upset. This medication may also be used as a mood stabilizer. If you believe that you are having side effects on the medication, please contact our office.    Fall Prevention in the Home  Falls can cause injuries and can affect people from all age groups. There are many simple things that you can do to make your home safe and to help prevent falls. WHAT CAN I DO ON THE OUTSIDE OF MY HOME?  Regularly repair the edges of walkways and driveways and fix any cracks.  Remove high doorway thresholds.  Trim any shrubbery on the main path into your home.  Use bright outdoor lighting.  Clear walkways of debris and clutter, including tools and rocks.  Regularly check that handrails are securely fastened and in good repair. Both sides of any steps should have handrails.  Install guardrails along the edges of any raised decks or porches.  Have leaves, snow, and ice cleared regularly.  Use sand or salt on walkways during winter months.  In the garage, clean up any spills right away, including grease or oil spills. WHAT CAN I DO IN THE BATHROOM?  Use night lights.  Install grab bars by the  toilet and in the tub and shower. Do not use towel bars as grab bars.  Use non-skid mats or decals on the floor of the tub or shower.  If you need to sit down while you are in the shower, use a plastic, non-slip stool.[name]  Keep the floor dry. Immediately clean up any water that spills on the floor.  Remove soap buildup in the tub or shower on a regular basis.  Attach bath mats securely with double-sided non-slip rug tape.  Remove throw rugs and other tripping hazards from the floor. WHAT CAN I DO IN THE BEDROOM?  Use night lights.  Make sure that a bedside light is easy to reach.  Do not use oversized bedding that drapes onto the floor.  Have a firm chair that has side arms to use for getting dressed.  Remove throw rugs and other tripping hazards from the floor. WHAT CAN I DO IN THE KITCHEN?   Clean up any spills right away.  Avoid walking on wet floors.  Place frequently used items in easy-to-reach places.  If you need to reach for something above you, use a sturdy step stool that has a grab bar.  Keep electrical cables out of the way.  Do not use floor polish or wax that makes floors slippery. If you have to use wax, make sure that it is non-skid floor wax.  Remove throw rugs and other tripping hazards from the floor. WHAT CAN I DO IN THE  STAIRWAYS?  Do not leave any items on the stairs.  Make sure that there are handrails on both sides of the stairs. Fix handrails that are broken or loose. Make sure that handrails are as long as the stairways.  Check any carpeting to make sure that it is firmly attached to the stairs. Fix any carpet that is loose or worn.  Avoid having throw rugs at the top or bottom of stairways, or secure the rugs with carpet tape to prevent them from moving.  Make sure that you have a light switch at the top of the stairs and the bottom of the stairs. If you do not have them, have them installed. WHAT ARE SOME OTHER FALL PREVENTION TIPS?  Wear  closed-toe shoes that fit well and support your feet. Wear shoes that have rubber soles or low heels.  When you use a stepladder, make sure that it is completely opened and that the sides are firmly locked. Have someone hold the ladder while you are using it. Do not climb a closed stepladder.  Add color or contrast paint or tape to grab bars and handrails in your home. Place contrasting color strips on the first and last steps.  Use mobility aids as needed, such as canes, walkers, scooters, and crutches.  Turn on lights if it is dark. Replace any light bulbs that burn out.  Set up furniture so that there are clear paths. Keep the furniture in the same spot.  Fix any uneven floor surfaces.  Choose a carpet design that does not hide the edge of steps of a stairway.  Be aware of any and all pets.  Review your medicines with your healthcare provider. Some medicines can cause dizziness or changes in blood pressure, which increase your risk of falling. Talk with your health care provider about other ways that you can decrease your risk of falls. This may include working with a physical therapist or trainer to improve your strength, balance, and endurance.   This information is not intended to replace advice given to you by your health care provider. Make sure you discuss any questions you have with your health care provider.   Document Released: 10/12/2002 Document Revised: 03/08/2015 Document Reviewed: 11/26/2014 Elsevier Interactive Patient Education [employer].

## 2016-07-04 NOTE — Progress Notes (Signed)
Reason for visit: Gait disturbance, sleepiness  Referring physician: Dr. Glenetta Ballard is a 75 y.o. female  History of present illness:  Alice Ballard is a 75 year old right-handed white female with a history of seizures dating back into the late 1970s. The patient has been seen previously by Dr. Erling Ballard, but she has not been seen through this office since 2012. The patient initially was treated with carbamazepine, but the patient had an interaction with one of her other heart medications, and she had to be switched to [place]. The patient has not had any seizures recently, the last seizure was in 1994. The patient describes the seizures as having a sudden blackout, when she comes around she may have crying spells or irritability afterwards. The patient has a significant cardiac history with a mechanical aortic valve and mitral valve replacement, history of atrial fibrillation, the patient has a pacemaker in place. The patient has developed a problem with walking and balance issues that has been present at least since 2012. The patient last fell about 6 weeks ago when she was going out to her garden. The patient has a cane and a walker, but she does not use these often. The patient has also had some issues with excessive daytime drowsiness. When the patient is inactive, she will fall sleep quite quickly. The patient sleeps fairly well at night. The patient denies that the addition of Keppra made any difference with her daytime drowsiness and she was drowsy before she went on the medication. The patient reports some cramping in the hands and feet at times, some occasional numbness in the hands. She reports some left neck discomfort she denies any significant low back pain. She does have some bowel and bladder incontinence at times. The patient reports generalized fatigue. She has undergone sleep studies on 3 occasions that did not show evidence of sleep apnea according to the patient. The patient denies  any headaches, she has occasional dizziness with standing but this is not a prominent feature of her symptom complex. She is sent to this office for an evaluation.  Past Medical History:  Diagnosis Date  . Cerebral vascular disease   . Coronary artery disease   . Fatty liver   . Gait abnormality   . Gastric polyp   . GERD (gastroesophageal reflux disease)   . Hemorrhoids   . Hiatal hernia   . Hyperlipidemia   . Hypersomnolence   . Hypertension   . Pacemaker july 2011   medtronic adapta model # ADDRL 1,SERIAL [id number]  . Paroxysmal atrial fibrillation (HCC)   . Seizure disorder ([place])   . Sick sinus syndrome ([place])   . Spinal stenosis   . Tubular adenoma of colon 11/2010  . UTI (urinary tract infection)     Past Surgical History:  Procedure Laterality Date  . AORTIC AND MITRAL VALVE REPLACEMENT  1994   67mm St. Jude  . BACK SURGERY    . CARDIAC CATHETERIZATION  04/2010  . DOPPLER ECHOCARDIOGRAPHY  sept 06, 2013   aotric valve grad. peak 51 mmHg and mean 28 mm Hg  dimensional subtractive index 0.26; mitral valve grad peak 25 and mean 81mm Hg norm pressure halftime 31msec.  [name] MITRAL VALVE REPLACEMENT  1986   St Jude prosthesis   . NM MYOCAR PERF WALL MOTION  03/31/2008   EF 69% low risk  . NM MYOCAR PERF WALL MOTION  04/19/2006   EF 69%  . PACEMAKER PLACEMENT  05/2010  . TEE  WITHOUT CARDIOVERSION  07/11/2012   Procedure: TRANSESOPHAGEAL ECHOCARDIOGRAM (TEE);  Surgeon: Alice Klein, MD;  Location: [facility] ENDOSCOPY;  Service: Cardiovascular;  Laterality: N/A;    Family History  Problem Relation Age of Onset  . Heart disease Mother   . Colon cancer Sister   . Colon polyps Sister     Social history:  reports that she has never smoked. She has never used smokeless tobacco. She reports that she does not drink alcohol or use drugs.  Medications:  Prior to Admission medications   Medication Sig Start Date End Date Taking? Authorizing Provider  acetaminophen (TYLENOL) 500 MG tablet  Take 1,000 mg by mouth every 6 (six) hours as needed. For pain   Yes Historical Provider, MD  alendronate (FOSAMAX) 70 MG tablet Take 70 mg by mouth every 7 (seven) days. Take with a full glass of water on an empty stomach. Takes on Sunday.   Yes Historical Provider, MD  Ascorbic Acid (VITAMIN C) 1000 MG tablet Take 1,000 mg by mouth daily.   Yes Historical Provider, MD  atorvastatin (LIPITOR) 80 MG tablet TAKE 1/2 TABLET BY MOUTH AT BEDTIME 01/13/16  Yes Alice Croitoru, MD  chlorthalidone (HYGROTON) 25 MG tablet Take 0.5 tablets (12.5 mg total) by mouth daily. 12/13/15  Yes Alice Croitoru, MD  Cholecalciferol (VITAMIN D3) 2000 UNITS capsule Take 2,000 Units by mouth daily.   Yes Historical Provider, MD  fluticasone (FLONASE) 50 MCG/ACT nasal spray Place 2 sprays into both nostrils 2 (two) times daily. 12/23/13  Yes Historical Provider, MD  folic acid (FOLVITE) 1 MG tablet TAKE 1 TABLET BY MOUTH EVERY DAY 01/13/16  Yes Alice Croitoru, MD  levETIRAcetam (KEPPRA) 500 MG tablet Take 500 mg by mouth 2 (two) times daily.   Yes Historical Provider, MD  lisinopril (PRINIVIL,ZESTRIL) 30 MG tablet TAKE 1 TABLET BY MOUTH EVERY DAY 01/13/16  Yes Alice Croitoru, MD  omeprazole (PRILOSEC) 20 MG capsule Take 20 mg by mouth daily.   Yes Historical Provider, MD  pantoprazole (PROTONIX) 40 MG tablet Take 40 mg by mouth daily as needed. For heartburn   Yes Historical Provider, MD  Polyethyl Glycol-Propyl Glycol (SYSTANE OP) Place 1 drop into both eyes daily as needed. For dry eyes   Yes Historical Provider, MD  warfarin (COUMADIN) 2.5 MG tablet TAKE 2 TABLETS BY MOUTH EVERY DAY OR AS DIRECTED BY COUMADIN CLINIC 02/13/16  Yes Alice Klein, MD      Allergies  Allergen Reactions  . Tegretol [Carbamazepine] Other (See Comments)    REACTION: Headaches  . Shellfish Allergy Rash    ROS:  Out of a complete 14 system review of symptoms, the patient complains only of the following symptoms, and all other reviewed systems  are negative.  Swelling in the legs Hearing loss, difficulty swallowing Snoring Blurred vision Easy bruising, easy bleeding Feeling hot, increased thirst Muscle cramps Gait disturbance  Blood pressure (!) 146/91, pulse 92, height 5' (1.524 m), weight 149 lb 8 oz (67.8 kg).   Blood pressure, right arm, sitting is 158/84. Blood pressure, right arm, standing is 164/84.  Physical Exam  General: The patient is alert and cooperative at the time of the examination.  Eyes: Pupils are equal, round, and reactive to light. Discs are flat bilaterally.  Neck: The neck is supple, no carotid bruits are noted.  Respiratory: The respiratory examination is clear.  Cardiovascular: The cardiovascular examination reveals a regular rate and rhythm, no obvious murmurs or rubs are noted. Valve clicks are noted.  Skin:  Extremities are with 1+ edema at the ankles bilaterally.  Neurologic Exam  Mental status: The patient is alert and oriented x 3 at the time of the examination. The patient has apparent normal recent and remote memory, with an apparently normal attention span and concentration ability.  Cranial nerves: Facial symmetry is present. There is good sensation of the face to pinprick and soft touch bilaterally. The strength of the facial muscles and the muscles to head turning and shoulder shrug are normal bilaterally. Speech is well enunciated, no aphasia or dysarthria is noted. Extraocular movements are full. Visual fields are full. The tongue is midline, and the patient has symmetric elevation of the soft palate. No obvious hearing deficits are noted.  Motor: The motor testing reveals 5 over 5 strength of all 4 extremities. Good symmetric motor tone is noted throughout.  Sensory: Sensory testing is intact to pinprick, soft touch, vibration sensation, and position sense on all 4 extremities. No evidence of extinction is noted.  Coordination: Cerebellar testing reveals good finger-nose-finger  and heel-to-shin bilaterally.  Gait and station: Gait is slightly wide-based, the patient can walk independently. Tandem gait is unsteady. Romberg is negative. No drift is seen.  Reflexes: Deep tendon reflexes are symmetric and normal bilaterally, with exception of some depression of ankle jerk reflexes bilaterally. Toes are downgoing bilaterally.   Assessment/Plan:  1. History of seizure disorder  2. History of gait disorder  3. Excessive daytime drowsiness  The Epworth sleepiness scale score is 15. The patient has a long duration history of excessive daytime drowsiness. Keppra certainly can cause drowsiness, particularly in the elderly. The patient indicates that the addition of Keppra did not worsen the drowsiness. We will however try to get the patient off of Keppra, switch to Lamictal to see if the drowsiness does improve. The patient was given a prescription for the Lamictal, they are to contact me when she gets to 75 mg twice daily. The patient will be sent for a CT scan of the head, she will be referred for physical therapy for gait training. Blood work will be done today. She will follow-up in about 4 months.  Alice Alexanders MD 07/04/2016 8:40 AM  [facility] [address] [place], [place] [address]  Phone [phone] Fax [phone]

## 2016-07-06 ENCOUNTER — Ambulatory Visit (INDEPENDENT_AMBULATORY_CARE_PROVIDER_SITE_OTHER): Payer: Medicare Other | Admitting: *Deleted

## 2016-07-06 ENCOUNTER — Telehealth: Payer: Self-pay | Admitting: Cardiology

## 2016-07-06 DIAGNOSIS — I480 Paroxysmal atrial fibrillation: Secondary | ICD-10-CM | POA: Diagnosis not present

## 2016-07-06 NOTE — Progress Notes (Signed)
Remote pacemaker transmission.   

## 2016-07-06 NOTE — Telephone Encounter (Signed)
Spoke with pt and reminded pt of remote transmission that is due today. Pt verbalized understanding.   

## 2016-07-10 ENCOUNTER — Telehealth: Payer: Self-pay | Admitting: Neurology

## 2016-07-10 LAB — VITAMIN B12: Vitamin B-12: 237 pg/mL (ref 211–946)

## 2016-07-10 LAB — COPPER, SERUM: COPPER: 80 ug/dL (ref 72–166)

## 2016-07-10 LAB — METHYLMALONIC ACID, SERUM: Methylmalonic Acid: 1933 nmol/L — ABNORMAL HIGH (ref 0–378)

## 2016-07-10 LAB — RPR: RPR: NONREACTIVE

## 2016-07-10 NOTE — Telephone Encounter (Signed)
I called the patient. The Blood work showed a low normal B12 level with a very elevated MMA level. She is to go on vitamin B12 1000 mcg a day.

## 2016-07-11 ENCOUNTER — Other Ambulatory Visit: Payer: Self-pay | Admitting: Cardiovascular Disease

## 2016-07-11 ENCOUNTER — Ambulatory Visit (INDEPENDENT_AMBULATORY_CARE_PROVIDER_SITE_OTHER): Payer: Medicare Other | Admitting: Pharmacist Clinician (PhC)/ Clinical Pharmacy Specialist

## 2016-07-11 DIAGNOSIS — I4891 Unspecified atrial fibrillation: Secondary | ICD-10-CM | POA: Diagnosis not present

## 2016-07-11 DIAGNOSIS — Z954 Presence of other heart-valve replacement: Secondary | ICD-10-CM | POA: Diagnosis not present

## 2016-07-11 DIAGNOSIS — Z7901 Long term (current) use of anticoagulants: Secondary | ICD-10-CM | POA: Diagnosis not present

## 2016-07-11 DIAGNOSIS — Z952 Presence of prosthetic heart valve: Secondary | ICD-10-CM

## 2016-07-11 LAB — POCT INR: INR: 2.6

## 2016-07-13 ENCOUNTER — Encounter: Payer: Self-pay | Admitting: Cardiology

## 2016-07-13 LAB — CUP PACEART REMOTE DEVICE CHECK
Brady Statistic RV Percent Paced: 99 %
Implantable Lead Implant Date: 20110701
Implantable Lead Location: 753859
Lead Channel Impedance Value: 495 Ohm
Lead Channel Impedance Value: 67 Ohm
Lead Channel Pacing Threshold Amplitude: 0.75 V
Lead Channel Setting Pacing Amplitude: 2.5 V
Lead Channel Setting Pacing Pulse Width: 0.4 ms
MDC IDC LEAD IMPLANT DT: 20110701
MDC IDC LEAD LOCATION: 753860
MDC IDC MSMT BATTERY IMPEDANCE: 351 Ohm
MDC IDC MSMT BATTERY REMAINING LONGEVITY: 101 mo
MDC IDC MSMT BATTERY VOLTAGE: 2.79 V
MDC IDC MSMT LEADCHNL RV PACING THRESHOLD PULSEWIDTH: 0.4 ms
MDC IDC SESS DTM: 20170901165548
MDC IDC SET LEADCHNL RV SENSING SENSITIVITY: 2.8 mV

## 2016-07-31 ENCOUNTER — Ambulatory Visit: Payer: Medicare Other | Attending: Neurology

## 2016-07-31 DIAGNOSIS — R2689 Other abnormalities of gait and mobility: Secondary | ICD-10-CM | POA: Diagnosis not present

## 2016-07-31 DIAGNOSIS — M6281 Muscle weakness (generalized): Secondary | ICD-10-CM | POA: Diagnosis not present

## 2016-07-31 DIAGNOSIS — R2681 Unsteadiness on feet: Secondary | ICD-10-CM | POA: Insufficient documentation

## 2016-07-31 NOTE — Therapy (Addendum)
[facility] [address] [place], [place], [address] Phone: [phone]   Fax:  [phone]  Physical Therapy Evaluation  Patient Details  Name: Alice Ballard MRN: [id number] Date of Birth: 06/27/1972 Referring Provider: Margette Fast, MD  Encounter Date: 07/31/2016      PT End of Session - 07/31/16 1104    Visit Number 1   Number of Visits 17   Date for PT Re-Evaluation 09/28/16   Authorization Type Medicare; G-code every 10th visit   PT Start Time 0930   PT Stop Time 1017   PT Time Calculation (min) 47 min   Equipment Utilized During Treatment Gait belt   Activity Tolerance Patient tolerated treatment well   Behavior During Therapy [facility] for tasks assessed/performed      Past Medical History:  Diagnosis Date  . Abnormality of gait 07/04/2016  . Cerebral vascular disease   . Coronary artery disease   . Excessive daytime sleepiness 07/04/2016  . Fatty liver   . Gait abnormality   . Gastric polyp   . GERD (gastroesophageal reflux disease)   . Hemorrhoids   . Hiatal hernia   . Hyperlipidemia   . Hypersomnolence   . Hypertension   . Pacemaker july 2011   medtronic adapta model # ADDRL 1,SERIAL [id number]  . Paroxysmal atrial fibrillation (HCC)   . Seizure disorder ([place])   . Sick sinus syndrome ([place])   . Spinal stenosis   . Tubular adenoma of colon 11/2010  . UTI (urinary tract infection)     Past Surgical History:  Procedure Laterality Date  . AORTIC AND MITRAL VALVE REPLACEMENT  1994   48mm St. Jude  . BACK SURGERY    . CARDIAC CATHETERIZATION  04/2010  . DOPPLER ECHOCARDIOGRAPHY  sept 06, 2013   aotric valve grad. peak 51 mmHg and mean 28 mm Hg  dimensional subtractive index 0.26; mitral valve grad peak 25 and mean 91mm Hg norm pressure halftime 33msec.  [name] MITRAL VALVE REPLACEMENT  1986   St Jude prosthesis   . NM MYOCAR PERF WALL MOTION  03/31/2008   EF 69% low risk  . NM MYOCAR PERF WALL MOTION   04/19/2006   EF 69%  . PACEMAKER PLACEMENT  05/2010  . TEE WITHOUT CARDIOVERSION  07/11/2012   Procedure: TRANSESOPHAGEAL ECHOCARDIOGRAM (TEE);  Surgeon: Sanda Klein, MD;  Location: [facility] ENDOSCOPY;  Service: Cardiovascular;  Laterality: N/A;    There were no vitals filed for this visit.       Subjective Assessment - 07/31/16 0942    Subjective Pt reports she "gets up kind of slow" and has fallen twice recently.  She also states she has difficulty walking and has a lack of energy although taking vitamin B12 has helped.  Prefers to go by "[name]".   Pertinent History Prefers to go by [employer]; aortic and mitral valve replacement, pacemaker, seizures, HTN, CAD, atrial fib, spinal stenosis, B&B incontinence, polyp in colon, hearing loss   Limitations Walking;House hold activities   How long can you stand comfortably? > 1 hour   How long can you walk comfortably? < 1000 ft   Patient Stated Goals "Get to where I won't fall all the time."   Currently in Pain? No/denies   Multiple Pain Sites No            [facility] PT Assessment - 07/31/16 0930      Assessment   Medical Diagnosis Abmormality of gait   Referring Provider Margette Fast, MD  Onset Date/Surgical Date 07/04/16   Hand Dominance Right     Precautions   Precautions Other (comment)   Precaution Comments On coumadin, watch for skin tears     Balance Screen   Has the patient fallen in the past 6 months Yes   How many times? 2   Has the patient had a decrease in activity level because of a fear of falling?  No   Is the patient reluctant to leave their home because of a fear of falling?  No     Home Ecologist residence   Living Arrangements Spouse/significant other   Available Help at Discharge Family   Type of [place] to enter   Entrance Stairs-Number of Steps 2   [place] One level   [place] - single point;Walker -  2 wheels;Walker - standard     Prior Function   Level of Independence Independent   Vocation Retired;On disability     ROM / Strength   AROM / PROM / Strength AROM;Strength     AROM   Overall AROM  Within functional limits for tasks performed     Strength   Overall Strength Within functional limits for tasks performed   Strength Assessment Site Hip;Knee;Ankle   Right/Left Hip Right;Left   Right Hip Flexion 3/5   Right Hip ABduction 3-/5   Left Hip Flexion 3/5   Left Hip ABduction 3-/5   Right/Left Knee Right;Left   Right Knee Flexion 3/5   Right Knee Extension 3+/5   Left Knee Flexion 3/5   Left Knee Extension 3+/5   Right/Left Ankle Right;Left   Right Ankle Dorsiflexion 3+/5   Left Ankle Dorsiflexion 3+/5     Transfers   Transfers Sit to Stand;Stand to Sit;Stand Pivot Transfers   Sit to Stand 5: Supervision   Stand to Sit 5: Supervision   Stand Pivot Transfers 5: Supervision     Standardized Balance Assessment   Standardized Balance Assessment Berg Balance Test     Berg Balance Test   Sit to Stand Able to stand without using hands and stabilize independently   Standing Unsupported Able to stand safely 2 minutes   Sitting with Back Unsupported but Feet Supported on Floor or Stool Able to sit safely and securely 2 minutes   Stand to Sit Sits safely with minimal use of hands   Transfers Able to transfer with verbal cueing and /or supervision   Standing Unsupported with Eyes Closed Able to stand 10 seconds with supervision   Standing Ubsupported with Feet Together Able to place feet together independently and stand for 1 minute with supervision   From Standing, Reach Forward with Outstretched Arm Can reach confidently >25 cm (10")   From Standing Position, Pick up Object from Floor Able to pick up shoe, needs supervision   From Standing Position, Turn to Look Behind Over each Shoulder Looks behind one side only/other side shows less weight shift   Turn 360 Degrees Needs  close supervision or verbal cueing   Standing Unsupported, Alternately Place Feet on Step/Stool Able to stand independently and complete 8 steps >20 seconds   Standing Unsupported, One Foot in Front Able to plae foot ahead of the other independently and hold 30 seconds   Standing on One Leg Tries to lift leg/unable to hold 3 seconds but remains standing independently   Total Score 42  [place] Adult PT Treatment/Exercise - 07/31/16 0930      Ambulation/Gait   Ambulation/Gait Yes   Ambulation/Gait Assistance 5: Supervision   Ambulation Distance (Feet) 30 Feet  2x4ft   Assistive device None   Gait Pattern Step-through pattern;Decreased arm swing - right;Decreased arm swing - left;Decreased step length - right;Decreased step length - left;Decreased hip/knee flexion - right;Decreased hip/knee flexion - left;Antalgic;Trunk flexed;Wide base of support   Ambulation Surface Level;Indoor                PT Education - 07/31/16 1102    Education provided Yes   Education Details Pt educated to prevent cats from walking between her legs when up and moving around the house in order to prevent future falls.  Pt also educated on POC, frequency, and duration of therapy.   Person(s) Educated Patient   Methods Explanation   Comprehension Verbalized understanding          PT Short Term Goals - 07/31/16 1119      PT SHORT TERM GOAL #1   Title Pt will be independent and verbalize understanding of initial HEP. (Target Date for all STGs; 08/31/16)   Time 4   Period Weeks   Status New     PT SHORT TERM GOAL #2   Title Pt will improve Berg Balance score to >45/56 to indicate a decr in fall risk.   Time 4   Period Weeks   Status New     PT SHORT TERM GOAL #3   Title Pt will improve DGI score by at least 2 increments to indicate a decr in fall risk.   Time 4   Period Weeks   Status New     PT SHORT TERM GOAL #4   Title Pt will ambulate 582ft indoors with LRAD  on level surfaces with supervision to incr functional mobility.   Time 4   Period Weeks   Status New     PT SHORT TERM GOAL #5   Title Pt will ascend/descend 4 steps with supervision, reciprocal stepping pattern, and L UE support to incr safety entering/exiting her home.   Time 4   Period Weeks   Status New           PT Long Term Goals - 07/31/16 1125      PT LONG TERM GOAL #1   Title Pt will be independent and verbalize understaning of HEP and on-going fitness plan to maintain progress made and improve overall health.  (Target Date for all LTGs: 09/28/16)   Time 8   Period Weeks   Status New     PT LONG TERM GOAL #2   Title Pt will improve Berg Balance score to >47/56 to decr fall risk.   Time 8   Period Weeks   Status New     PT LONG TERM GOAL #3   Title Pt will improve DGI score to >19/24 to indicate a decr in fall risk.   Time 8   Period Weeks   Status New     PT LONG TERM GOAL #4   Title Pt will ambulate 1000 ft with mod I outdoors over eneven surfaces, pavement, gravel, grass, ramps, and curbs to improve functional mobility and ability to perform ADLs in the community.   Time 8   Period Weeks   Status New     PT LONG TERM GOAL #5   Title Pt will ascend/descend 8 steps with mod I, reciprocal stepping pattern, and L UE  support to incr safety entering/exiting her home.   Time 8   Period Weeks   Status New               Plan - 07/31/16 1105    Clinical Impression Statement Pt is a 75 year old female who presents to outpatient PT with a diagnosis of abnormality of gait (07/04/16) and a history of recent falls.  Pt presents with PMH significant for aortic and mitral valve replacement, h/o seizures, pacemaker, HTN, atrial fib, and spinal stenosis.  She demonstrates a decr in LE strength bilaterally and reports decr endurance requiring frequent rests breaks when performing ADLs at home and in the community.  She also reports cramping in B LE and feet that will  be monitored during therapy sessions but not directly addressed.  Pt demonstrates a significat risk for falls as indicated by a Berg Balance score of 42/56.  She also observed with difficulty walking that was not officially assessed due to lack of time.  Next session will assess gait with DGI.  Pt's condition appears to be evolving and of moderate complexity.  She would benefit from skilled PT to address her deficits.   Rehab Potential Good   Clinical Impairments Affecting Rehab Potential atrial fib, pacemaker, aortic and mitral valve replacement, HTN, hearing loss   PT Frequency 2x / week   PT Duration 8 weeks   PT Treatment/Interventions ADLs/Self Care Home Management;Functional mobility training;Stair training;Gait training;DME Instruction;Therapeutic activities;Therapeutic exercise;Balance training;Neuromuscular re-education;Patient/family education;Manual techniques;Energy conservation   PT Next Visit Plan Complete DGI, initiate HEP, address dynamic gait and balance, formally assess dizziness, ask if colon polyp was benign or malignant, monitor BP   Consulted and Agree with Plan of Care Patient      Patient will benefit from skilled therapeutic intervention in order to improve the following deficits and impairments:  Abnormal gait, Decreased activity tolerance, Decreased balance, Decreased mobility, Decreased knowledge of use of DME, Decreased endurance, Decreased safety awareness, Decreased strength, Impaired perceived functional ability, Impaired flexibility, Improper body mechanics, Pain  Visit Diagnosis: Other abnormalities of gait and mobility  Unsteadiness on feet  Muscle weakness (generalized)     Problem List Patient Active Problem List   Diagnosis Date Noted  . Convulsions/seizures ([place]) 07/04/2016  . Excessive daytime sleepiness 07/04/2016  . Abnormality of gait 07/04/2016  . Epistaxis, recurrent 04/06/2013  . HTN (hypertension) 04/06/2013  . CKD (chronic kidney  disease), stage III 04/06/2013  . Hyperlipidemia 04/06/2013  . Long term current use of anticoagulant therapy 01/20/2013  . Normocytic anemia 07/06/2012  . Pre-syncope 07/05/2012  . Pacemaker: Medtronic 2011 for tachy brady AFIB 07/05/2012  . Hx of mechanical aortic valve replacement 07/05/2012  . History of mitral valve replacement, mechanical 07/05/2012  . Personal history of colonic polyps 07/26/2011  . Esophageal reflux 07/26/2011  . Atrial fibrillation ([place]) 10/09/2010  . FATTY LIVER DISEASE 10/09/2010    Katherine Mantle, SPT 08/01/2016, 8:42 AM  [facility] [address] [place], [place], [address] Phone: [phone]   Fax:  [phone]  Name: KELCY ALLREAD MRN: [id number] Date of Birth: 22-May-1941  Geoffry Paradise, PT,DPT 08/01/16 10:57 AM Phone: [phone] Fax: [phone]

## 2016-08-09 ENCOUNTER — Ambulatory Visit: Payer: Medicare Other | Admitting: Physical Therapy

## 2016-08-09 ENCOUNTER — Ambulatory Visit (INDEPENDENT_AMBULATORY_CARE_PROVIDER_SITE_OTHER): Payer: Medicare Other | Admitting: Pharmacist

## 2016-08-09 DIAGNOSIS — I4891 Unspecified atrial fibrillation: Secondary | ICD-10-CM

## 2016-08-09 DIAGNOSIS — Z7901 Long term (current) use of anticoagulants: Secondary | ICD-10-CM | POA: Diagnosis not present

## 2016-08-09 DIAGNOSIS — Z952 Presence of prosthetic heart valve: Secondary | ICD-10-CM | POA: Diagnosis not present

## 2016-08-09 LAB — POCT INR: INR: 2.3

## 2016-08-10 ENCOUNTER — Other Ambulatory Visit: Payer: Self-pay | Admitting: Cardiovascular Disease

## 2016-08-13 ENCOUNTER — Telehealth: Payer: Self-pay | Admitting: Neurology

## 2016-08-13 NOTE — Telephone Encounter (Signed)
I called patient. The patient is not on the Lamictal, she is on the [place], she feels that the B12 supplementation has helped her. She will continue the [place] for now. No indication to check Lamictal levels.

## 2016-08-16 ENCOUNTER — Ambulatory Visit: Payer: Medicare Other | Admitting: Physical Therapy

## 2016-08-22 ENCOUNTER — Ambulatory Visit: Payer: Medicare Other

## 2016-08-22 DIAGNOSIS — Z23 Encounter for immunization: Secondary | ICD-10-CM | POA: Diagnosis not present

## 2016-08-23 NOTE — Therapy (Signed)
[facility] [address] [place], [place], [address] Phone: [phone]   Fax:  [phone]  Patient Details  Name: Alice Ballard MRN: [id number] Date of Birth: 22-Aug-1941 Referring Provider:  No ref. provider found  Encounter Date: 2016/09/21   PHYSICAL THERAPY DISCHARGE SUMMARY  Visits from Start of Care: 1  Current functional level related to goals / functional outcomes:     PT Short Term Goals - 07/31/16 1119      PT SHORT TERM GOAL #1   Title Pt will be independent and verbalize understanding of initial HEP. (Target Date for all STGs; 08/31/16)    Time 4   Period Weeks   Status New     PT SHORT TERM GOAL #2   Title Pt will improve Berg Balance score to >45/56 to indicate a decr in fall risk.   Time 4   Period Weeks   Status New     PT SHORT TERM GOAL #3   Title Pt will improve DGI score by at least 2 increments to indicate a decr in fall risk.   Time 4   Period Weeks   Status New     PT SHORT TERM GOAL #4   Title Pt will ambulate 573f indoors with LRAD on level surfaces with supervision to incr functional mobility.   Time 4   Period Weeks   Status New     PT SHORT TERM GOAL #5   Title Pt will ascend/descend 4 steps with supervision, reciprocal stepping pattern, and L UE support to incr safety entering/exiting her home.   Time 4   Period Weeks   Status New         PT Long Term Goals - 07/31/16 1125      PT LONG TERM GOAL #1   Title Pt will be independent and verbalize understaning of HEP and on-going fitness plan to maintain progress made and improve overall health.  (Target Date for all LTGs: 09/28/16)   Time 8   Period Weeks   Status New     PT LONG TERM GOAL #2   Title Pt will improve Berg Balance score to >47/56 to decr fall risk.   Time 8   Period Weeks   Status New     PT LONG TERM GOAL #3   Title Pt will improve DGI score to >19/24 to indicate a decr in fall risk.   Time 8   Period Weeks   Status New     PT LONG TERM GOAL #4   Title Pt will ambulate 1000 ft with mod I outdoors over eneven surfaces, pavement, gravel, grass, ramps, and curbs to improve functional mobility and ability to perform ADLs in the community.   Time 8   Period Weeks   Status New     PT LONG TERM GOAL #5   Title Pt will ascend/descend 8 steps with mod I, reciprocal stepping pattern, and L UE support to incr safety entering/exiting her home.   Time 8   Period Weeks   Status New        Remaining deficits: Unknown, as pt cancelled remaining visits, stating she could not afford PT at this time.   Education / Equipment: PT duration/frequency.  Plan: Patient agrees to discharge.  Patient goals were not met. Patient is being discharged due to not returning since the last visit.  ?????           G-Codes - 1November 17, [id number]  Functional Assessment Tool Used Same as eval, as pt did not return. BERG: 42/56   Functional Limitation Mobility: Walking and moving around   Mobility: Walking and Moving Around Goal Status [phone]) At least 40 percent but less than 60 percent impaired, limited or restricted   Mobility: Walking and Moving Around Discharge Status [phone]) At least 40 percent but less than 60 percent impaired, limited or restricted      Laveta Gilkey L 08/23/2016, 9:07 AM  [facility] [address] [place], [place], [address] Phone: [phone]   Fax:  [phone]   Geoffry Paradise, PT,DPT 08/23/16 9:08 AM Phone: [phone] Fax: [phone]

## 2016-08-24 ENCOUNTER — Ambulatory Visit: Payer: Medicare Other | Admitting: Physical Therapy

## 2016-08-28 ENCOUNTER — Ambulatory Visit: Payer: Medicare Other | Admitting: Physical Therapy

## 2016-08-31 ENCOUNTER — Ambulatory Visit: Payer: Medicare Other

## 2016-09-04 ENCOUNTER — Ambulatory Visit: Payer: Medicare Other

## 2016-09-06 ENCOUNTER — Ambulatory Visit: Payer: Medicare Other

## 2016-09-07 ENCOUNTER — Ambulatory Visit (INDEPENDENT_AMBULATORY_CARE_PROVIDER_SITE_OTHER): Payer: Medicare Other | Admitting: Pharmacist Clinician (PhC)/ Clinical Pharmacy Specialist

## 2016-09-07 DIAGNOSIS — Z952 Presence of prosthetic heart valve: Secondary | ICD-10-CM | POA: Diagnosis not present

## 2016-09-07 DIAGNOSIS — I4891 Unspecified atrial fibrillation: Secondary | ICD-10-CM

## 2016-09-07 DIAGNOSIS — Z7901 Long term (current) use of anticoagulants: Secondary | ICD-10-CM | POA: Diagnosis not present

## 2016-09-07 LAB — POCT INR: INR: 1.6

## 2016-09-11 ENCOUNTER — Ambulatory Visit: Payer: Medicare Other

## 2016-09-13 ENCOUNTER — Ambulatory Visit: Payer: Medicare Other

## 2016-09-18 ENCOUNTER — Ambulatory Visit: Payer: Medicare Other

## 2016-09-20 ENCOUNTER — Ambulatory Visit: Payer: Medicare Other

## 2016-09-24 ENCOUNTER — Ambulatory Visit (INDEPENDENT_AMBULATORY_CARE_PROVIDER_SITE_OTHER): Payer: Medicare Other | Admitting: Pharmacist

## 2016-09-24 ENCOUNTER — Ambulatory Visit: Payer: Medicare Other | Admitting: Physical Therapy

## 2016-09-24 DIAGNOSIS — I4891 Unspecified atrial fibrillation: Secondary | ICD-10-CM | POA: Diagnosis not present

## 2016-09-24 DIAGNOSIS — Z952 Presence of prosthetic heart valve: Secondary | ICD-10-CM

## 2016-09-24 DIAGNOSIS — Z7901 Long term (current) use of anticoagulants: Secondary | ICD-10-CM

## 2016-09-24 LAB — POCT INR: INR: 2.2

## 2016-09-26 ENCOUNTER — Ambulatory Visit: Payer: Medicare Other | Admitting: Physical Therapy

## 2016-10-02 ENCOUNTER — Ambulatory Visit: Payer: Medicare Other

## 2016-10-04 ENCOUNTER — Ambulatory Visit: Payer: Medicare Other

## 2016-10-15 ENCOUNTER — Ambulatory Visit (INDEPENDENT_AMBULATORY_CARE_PROVIDER_SITE_OTHER): Payer: Medicare Other | Admitting: Cardiovascular Disease

## 2016-10-15 ENCOUNTER — Encounter: Payer: Self-pay | Admitting: Cardiovascular Disease

## 2016-10-15 ENCOUNTER — Ambulatory Visit (INDEPENDENT_AMBULATORY_CARE_PROVIDER_SITE_OTHER): Payer: Medicare Other | Admitting: Pharmacist Clinician (PhC)/ Clinical Pharmacy Specialist

## 2016-10-15 VITALS — BP 150/72 | HR 75 | Ht 60.0 in | Wt 149.0 lb

## 2016-10-15 DIAGNOSIS — I481 Persistent atrial fibrillation: Secondary | ICD-10-CM

## 2016-10-15 DIAGNOSIS — Z952 Presence of prosthetic heart valve: Secondary | ICD-10-CM | POA: Diagnosis not present

## 2016-10-15 DIAGNOSIS — I4819 Other persistent atrial fibrillation: Secondary | ICD-10-CM

## 2016-10-15 DIAGNOSIS — Z7901 Long term (current) use of anticoagulants: Secondary | ICD-10-CM | POA: Diagnosis not present

## 2016-10-15 DIAGNOSIS — R269 Unspecified abnormalities of gait and mobility: Secondary | ICD-10-CM

## 2016-10-15 DIAGNOSIS — Z950 Presence of cardiac pacemaker: Secondary | ICD-10-CM | POA: Diagnosis not present

## 2016-10-15 DIAGNOSIS — N183 Chronic kidney disease, stage 3 (moderate): Secondary | ICD-10-CM | POA: Diagnosis not present

## 2016-10-15 DIAGNOSIS — I4891 Unspecified atrial fibrillation: Secondary | ICD-10-CM

## 2016-10-15 DIAGNOSIS — E7800 Pure hypercholesterolemia, unspecified: Secondary | ICD-10-CM

## 2016-10-15 DIAGNOSIS — I10 Essential (primary) hypertension: Secondary | ICD-10-CM

## 2016-10-15 LAB — POCT INR: INR: 2.1
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
## 2016-10-15 NOTE — Patient Instructions (Signed)
Dr Sallyanne Kuster recommends that you continue on your current medications as directed. Please refer to the Current Medication list given to you today.  Remote monitoring is used to monitor your Pacemaker of ICD from home. This monitoring reduces the number of office visits required to check your device to one time per year. It allows [place] to keep an eye on the functioning of your device to ensure it is working properly. You are scheduled for a device check from home on Monday, March 12th, 2018. You may send your transmission at any time that day. If you have a wireless device, the transmission will be sent automatically. After your physician reviews your transmission, you will receive a postcard with your next transmission date.  Dr Sallyanne Kuster recommends that you schedule a follow-up appointment in 6 months with a pacemaker check. You will receive a reminder letter in the mail two months in advance. If you don't receive a letter, please call our office to schedule the follow-up appointment.  If you need a refill on your cardiac medications before your next appointment, please call your pharmacy.

## 2016-10-18 ENCOUNTER — Other Ambulatory Visit: Payer: Self-pay | Admitting: Cardiovascular Disease

## 2016-11-13 ENCOUNTER — Ambulatory Visit: Payer: Medicare Other | Admitting: Neurology

## 2016-11-14 LAB — CUP PACEART REMOTE DEVICE CHECK
Date Time Interrogation Session: 20171211131612
Implantable Lead Implant Date: 20110701
Implantable Lead Location: 753859
Implantable Lead Model: 5076
Implantable Pulse Generator Implant Date: 20110701
Lead Channel Impedance Value: 486 Ohm
Lead Channel Impedance Value: 67 Ohm
Lead Channel Pacing Threshold Amplitude: 0.625 V
Lead Channel Pacing Threshold Pulse Width: 0.4 ms
MDC IDC LEAD IMPLANT DT: 20110701
MDC IDC LEAD LOCATION: 753860
MDC IDC MSMT BATTERY IMPEDANCE: 376 Ohm
MDC IDC MSMT BATTERY REMAINING LONGEVITY: 98 mo
MDC IDC MSMT BATTERY VOLTAGE: 2.79 V
MDC IDC SET LEADCHNL RV PACING AMPLITUDE: 2.5 V
MDC IDC SET LEADCHNL RV PACING PULSEWIDTH: 0.4 ms
MDC IDC SET LEADCHNL RV SENSING SENSITIVITY: 2.8 mV
MDC IDC STAT BRADY RV PERCENT PACED: 99 %

## 2016-11-19 ENCOUNTER — Ambulatory Visit (INDEPENDENT_AMBULATORY_CARE_PROVIDER_SITE_OTHER): Payer: Medicare Other | Admitting: Pharmacist

## 2016-11-19 DIAGNOSIS — Z952 Presence of prosthetic heart valve: Secondary | ICD-10-CM | POA: Diagnosis not present

## 2016-11-19 DIAGNOSIS — I4891 Unspecified atrial fibrillation: Secondary | ICD-10-CM | POA: Diagnosis not present

## 2016-11-19 DIAGNOSIS — Z7901 Long term (current) use of anticoagulants: Secondary | ICD-10-CM

## 2016-11-19 LAB — POCT INR: INR: 4.8

## 2016-11-29 ENCOUNTER — Ambulatory Visit (INDEPENDENT_AMBULATORY_CARE_PROVIDER_SITE_OTHER): Payer: Medicare Other | Admitting: Pharmacist Clinician (PhC)/ Clinical Pharmacy Specialist

## 2016-11-29 DIAGNOSIS — Z7901 Long term (current) use of anticoagulants: Secondary | ICD-10-CM | POA: Diagnosis not present

## 2016-11-29 DIAGNOSIS — Z952 Presence of prosthetic heart valve: Secondary | ICD-10-CM | POA: Diagnosis not present

## 2016-11-29 DIAGNOSIS — I4891 Unspecified atrial fibrillation: Secondary | ICD-10-CM | POA: Diagnosis not present

## 2016-11-29 LAB — POCT INR: INR: 3.8

## 2016-11-30 DIAGNOSIS — D649 Anemia, unspecified: Secondary | ICD-10-CM | POA: Diagnosis not present

## 2016-11-30 DIAGNOSIS — E039 Hypothyroidism, unspecified: Secondary | ICD-10-CM | POA: Diagnosis not present

## 2016-11-30 DIAGNOSIS — E782 Mixed hyperlipidemia: Secondary | ICD-10-CM | POA: Diagnosis not present

## 2016-11-30 DIAGNOSIS — I10 Essential (primary) hypertension: Secondary | ICD-10-CM | POA: Diagnosis not present

## 2016-11-30 DIAGNOSIS — N183 Chronic kidney disease, stage 3 (moderate): Secondary | ICD-10-CM | POA: Diagnosis not present

## 2016-12-04 DIAGNOSIS — Z23 Encounter for immunization: Secondary | ICD-10-CM | POA: Diagnosis not present

## 2016-12-04 DIAGNOSIS — I10 Essential (primary) hypertension: Secondary | ICD-10-CM | POA: Diagnosis not present

## 2016-12-04 DIAGNOSIS — Z0001 Encounter for general adult medical examination with abnormal findings: Secondary | ICD-10-CM | POA: Diagnosis not present

## 2016-12-12 ENCOUNTER — Ambulatory Visit (INDEPENDENT_AMBULATORY_CARE_PROVIDER_SITE_OTHER): Payer: Medicare Other | Admitting: Pharmacist

## 2016-12-12 DIAGNOSIS — Z952 Presence of prosthetic heart valve: Secondary | ICD-10-CM

## 2016-12-12 DIAGNOSIS — Z7901 Long term (current) use of anticoagulants: Secondary | ICD-10-CM | POA: Diagnosis not present

## 2016-12-12 DIAGNOSIS — I4891 Unspecified atrial fibrillation: Secondary | ICD-10-CM | POA: Diagnosis not present

## 2016-12-12 LAB — POCT INR: INR: 2.7

## 2016-12-26 ENCOUNTER — Ambulatory Visit (INDEPENDENT_AMBULATORY_CARE_PROVIDER_SITE_OTHER): Payer: Medicare Other | Admitting: Pharmacist Clinician (PhC)/ Clinical Pharmacy Specialist

## 2016-12-26 DIAGNOSIS — I4891 Unspecified atrial fibrillation: Secondary | ICD-10-CM

## 2016-12-26 DIAGNOSIS — Z7901 Long term (current) use of anticoagulants: Secondary | ICD-10-CM

## 2016-12-26 DIAGNOSIS — Z952 Presence of prosthetic heart valve: Secondary | ICD-10-CM | POA: Diagnosis not present

## 2016-12-26 LAB — POCT INR: INR: 3.5

## 2017-01-08 ENCOUNTER — Ambulatory Visit (INDEPENDENT_AMBULATORY_CARE_PROVIDER_SITE_OTHER): Payer: Medicare Other | Admitting: Pharmacist

## 2017-01-08 DIAGNOSIS — Z7901 Long term (current) use of anticoagulants: Secondary | ICD-10-CM | POA: Diagnosis not present

## 2017-01-08 DIAGNOSIS — I4891 Unspecified atrial fibrillation: Secondary | ICD-10-CM

## 2017-01-08 DIAGNOSIS — Z952 Presence of prosthetic heart valve: Secondary | ICD-10-CM

## 2017-01-08 LAB — POCT INR: INR: 2.6

## 2017-01-14 ENCOUNTER — Telehealth: Payer: Self-pay | Admitting: Cardiology

## 2017-01-14 ENCOUNTER — Ambulatory Visit (INDEPENDENT_AMBULATORY_CARE_PROVIDER_SITE_OTHER): Payer: Medicare Other | Admitting: *Deleted

## 2017-01-14 DIAGNOSIS — I481 Persistent atrial fibrillation: Secondary | ICD-10-CM

## 2017-01-14 DIAGNOSIS — I4819 Other persistent atrial fibrillation: Secondary | ICD-10-CM

## 2017-01-14 NOTE — Telephone Encounter (Signed)
Spoke with pt and reminded pt of remote transmission that is due today. Pt verbalized understanding.   

## 2017-01-16 ENCOUNTER — Encounter: Payer: Self-pay | Admitting: Cardiology

## 2017-01-16 NOTE — Progress Notes (Signed)
Remote pacemaker transmission.   

## 2017-01-18 LAB — CUP PACEART REMOTE DEVICE CHECK
Battery Impedance: 475 Ohm
Brady Statistic RV Percent Paced: 99 %
Implantable Lead Location: 753860
Implantable Lead Model: 5076
Lead Channel Pacing Threshold Amplitude: 0.625 V
Lead Channel Setting Pacing Amplitude: 2.5 V
MDC IDC LEAD IMPLANT DT: 20110701
MDC IDC LEAD IMPLANT DT: 20110701
MDC IDC LEAD LOCATION: 753859
MDC IDC MSMT BATTERY REMAINING LONGEVITY: 91 mo
MDC IDC MSMT BATTERY VOLTAGE: 2.79 V
MDC IDC MSMT LEADCHNL RA IMPEDANCE VALUE: 67 Ohm
MDC IDC MSMT LEADCHNL RV IMPEDANCE VALUE: 520 Ohm
MDC IDC MSMT LEADCHNL RV PACING THRESHOLD PULSEWIDTH: 0.4 ms
MDC IDC PG IMPLANT DT: 20110701
MDC IDC SESS DTM: 20180314133454
MDC IDC SET LEADCHNL RV PACING PULSEWIDTH: 0.4 ms
MDC IDC SET LEADCHNL RV SENSING SENSITIVITY: 2.8 mV

## 2017-01-21 ENCOUNTER — Ambulatory Visit (INDEPENDENT_AMBULATORY_CARE_PROVIDER_SITE_OTHER): Payer: Medicare Other | Admitting: Pharmacist

## 2017-01-21 DIAGNOSIS — I4891 Unspecified atrial fibrillation: Secondary | ICD-10-CM

## 2017-01-21 DIAGNOSIS — Z952 Presence of prosthetic heart valve: Secondary | ICD-10-CM

## 2017-01-21 DIAGNOSIS — Z7901 Long term (current) use of anticoagulants: Secondary | ICD-10-CM

## 2017-01-21 LAB — POCT INR: INR: 2.7

## 2017-02-11 ENCOUNTER — Ambulatory Visit (INDEPENDENT_AMBULATORY_CARE_PROVIDER_SITE_OTHER): Payer: Medicare Other | Admitting: Pharmacist

## 2017-02-11 DIAGNOSIS — I4891 Unspecified atrial fibrillation: Secondary | ICD-10-CM | POA: Diagnosis not present

## 2017-02-11 DIAGNOSIS — Z952 Presence of prosthetic heart valve: Secondary | ICD-10-CM | POA: Diagnosis not present

## 2017-02-11 DIAGNOSIS — Z7901 Long term (current) use of anticoagulants: Secondary | ICD-10-CM

## 2017-02-11 LAB — POCT INR: INR: 2.1

## 2017-02-13 ENCOUNTER — Other Ambulatory Visit: Payer: Self-pay | Admitting: Cardiovascular Disease

## 2017-02-25 DIAGNOSIS — H524 Presbyopia: Secondary | ICD-10-CM | POA: Diagnosis not present

## 2017-02-25 DIAGNOSIS — H40013 Open angle with borderline findings, low risk, bilateral: Secondary | ICD-10-CM | POA: Diagnosis not present

## 2017-02-25 DIAGNOSIS — Z961 Presence of intraocular lens: Secondary | ICD-10-CM | POA: Diagnosis not present

## 2017-03-11 ENCOUNTER — Ambulatory Visit (INDEPENDENT_AMBULATORY_CARE_PROVIDER_SITE_OTHER): Payer: Medicare Other | Admitting: Pharmacist

## 2017-03-11 DIAGNOSIS — Z952 Presence of prosthetic heart valve: Secondary | ICD-10-CM | POA: Diagnosis not present

## 2017-03-11 DIAGNOSIS — Z7901 Long term (current) use of anticoagulants: Secondary | ICD-10-CM | POA: Diagnosis not present

## 2017-03-11 DIAGNOSIS — I4891 Unspecified atrial fibrillation: Secondary | ICD-10-CM

## 2017-03-11 LAB — POCT INR: INR: 3

## 2017-03-14 ENCOUNTER — Ambulatory Visit: Payer: Medicare Other | Admitting: Neurology

## 2017-04-09 ENCOUNTER — Ambulatory Visit (INDEPENDENT_AMBULATORY_CARE_PROVIDER_SITE_OTHER): Payer: Medicare Other | Admitting: Pharmacist Clinician (PhC)/ Clinical Pharmacy Specialist

## 2017-04-09 DIAGNOSIS — I4891 Unspecified atrial fibrillation: Secondary | ICD-10-CM

## 2017-04-09 DIAGNOSIS — Z7901 Long term (current) use of anticoagulants: Secondary | ICD-10-CM | POA: Diagnosis not present

## 2017-04-09 DIAGNOSIS — Z952 Presence of prosthetic heart valve: Secondary | ICD-10-CM

## 2017-04-09 LAB — POCT INR: INR: 1.8

## 2017-04-29 ENCOUNTER — Ambulatory Visit (INDEPENDENT_AMBULATORY_CARE_PROVIDER_SITE_OTHER): Payer: Medicare Other | Admitting: Pharmacist Clinician (PhC)/ Clinical Pharmacy Specialist

## 2017-04-29 DIAGNOSIS — Z952 Presence of prosthetic heart valve: Secondary | ICD-10-CM | POA: Diagnosis not present

## 2017-04-29 DIAGNOSIS — I4891 Unspecified atrial fibrillation: Secondary | ICD-10-CM

## 2017-04-29 DIAGNOSIS — Z7901 Long term (current) use of anticoagulants: Secondary | ICD-10-CM | POA: Diagnosis not present

## 2017-04-29 LAB — POCT INR: INR: 3

## 2017-04-30 DIAGNOSIS — H26493 Other secondary cataract, bilateral: Secondary | ICD-10-CM | POA: Diagnosis not present

## 2017-04-30 DIAGNOSIS — H40023 Open angle with borderline findings, high risk, bilateral: Secondary | ICD-10-CM | POA: Diagnosis not present

## 2017-05-15 ENCOUNTER — Ambulatory Visit (INDEPENDENT_AMBULATORY_CARE_PROVIDER_SITE_OTHER): Payer: Medicare Other | Admitting: Pharmacist

## 2017-05-15 ENCOUNTER — Ambulatory Visit (INDEPENDENT_AMBULATORY_CARE_PROVIDER_SITE_OTHER): Payer: Medicare Other | Admitting: Cardiovascular Disease

## 2017-05-15 VITALS — BP 138/76 | HR 69 | Ht 61.0 in | Wt 151.6 lb

## 2017-05-15 DIAGNOSIS — I472 Ventricular tachycardia: Secondary | ICD-10-CM

## 2017-05-15 DIAGNOSIS — I4891 Unspecified atrial fibrillation: Secondary | ICD-10-CM

## 2017-05-15 DIAGNOSIS — Z952 Presence of prosthetic heart valve: Secondary | ICD-10-CM

## 2017-05-15 DIAGNOSIS — I482 Chronic atrial fibrillation: Secondary | ICD-10-CM | POA: Diagnosis not present

## 2017-05-15 DIAGNOSIS — I10 Essential (primary) hypertension: Secondary | ICD-10-CM | POA: Diagnosis not present

## 2017-05-15 DIAGNOSIS — Z7901 Long term (current) use of anticoagulants: Secondary | ICD-10-CM

## 2017-05-15 DIAGNOSIS — Z950 Presence of cardiac pacemaker: Secondary | ICD-10-CM | POA: Diagnosis not present

## 2017-05-15 DIAGNOSIS — I4821 Permanent atrial fibrillation: Secondary | ICD-10-CM

## 2017-05-15 DIAGNOSIS — I4729 Other ventricular tachycardia: Secondary | ICD-10-CM

## 2017-05-15 LAB — POCT INR: INR: 2.1

## 2017-05-15 NOTE — Progress Notes (Signed)
Patient ID: Alice Ballard, female   DOB: 1941/09/09, 76 y.o.   MRN: [id number]    Cardiology Office Note    Date:  05/17/2017   ID:  Alice Ballard, DOB [birth year], MRN [id number]  PCP:  Curlene Labrum, MD  Cardiologist:   Sanda Klein, MD   Chief Complaint  Patient presents with  . Pacemaker Check  . Dizziness    started this morning prior to arrival at office    History of Present Illness:  Alice Ballard is a 76 y.o. 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   Recently, she has been having a lot of problems with dizziness and unsteady gait. Thankfully she has not had any serious falls. She has not been taking diuretics for a year now (although chlorthalidone was in her medication list) and does not have shortness of breath or leg edema. She is having increasingly frequent problems with runny stools and incontinence. She is not taking a proton pump inhibitor on a regular basis, even though both omeprazole and pantoprazole are listed. Warfarin anticoagulation has been fairly steady and she denies any bleeding complications.  She does not have any cardiac complaints since her last appointment and specifically denies dyspnea, syncope, palpitations or serious bleeding problems. She does not have a history of embolic events, stroke/TIA. She has not had any further falls.   She is still looking to have extraction of the few remaining teeth on her lower jaw. She is aware of the need for antibiotic prophylaxis when she does this. Ideally this will be done without interruption of warfarin since she has mechanical aortic and mitral valves.  Remote pacemaker download today showed 99% ventricular pacing and normal device function. Estimated device longevity 7 years (Medtronic Adapta implanted 2011, dual-chamber device now  programmed VVIR). There are occasional episodes of high ventricular rate. The longest episode of rapid rate is10 seconds in duration, certainly representing nonsustained VT, but relatively slow at around 150 bpm Lead parameters are within normal range.  Alice Ballard is a 76 year old woman with a mechanical aortic and mitral valve prostheses as well as recurrent persistent atrial fibrillation, hypertension, dyslipidemia. In 1986 for mitral valve was replaced with a 29 mm St. Jude prosthesis for rheumatic disease. In 1994 her aortic valve was replaced with a 19 mm St. Jude device. Has chronic moderate elevation in the valvular gradients across the aortic valve, likely an expression of patient-valve mismatch. Fluoroscopy has shown normal disc motion. Previous right heart catheterization had shown only very mild pulmonary hypertension (mean PA pressure 25 mm Hg, wedge pressure 16 mm Hg) She has no history of coronary artery disease. She has a dual-chamber permanent pacemaker (Medtronic) that was implanted in 2011 for tachycardia-bradycardia syndrome. She is on chronic warfarin anticoagulation both for her mechanical valves and for atrial fibrillation. She seems to now have permanent atrial fibrillation, with slow ventricular rate, virtually 100% V paced. She has not had bleeding complications or embolic events. She does not have a history of stroke.  Past Medical History:  Diagnosis Date  . Abnormality of gait 07/04/2016  . Cerebral vascular disease   . Coronary artery disease   . Excessive daytime sleepiness 07/04/2016  . Fatty liver   . Gait abnormality   . Gastric polyp   . GERD (gastroesophageal reflux disease)   . Hemorrhoids   . Hiatal hernia   .  Hyperlipidemia   . Hypersomnolence   . Hypertension   . Pacemaker july 2011   medtronic adapta model # ADDRL 1,SERIAL [id number]  . Paroxysmal atrial fibrillation (HCC)   . Seizure disorder ([place])   . Sick sinus syndrome ([place])   . Spinal stenosis   .  Tubular adenoma of colon 11/2010  . UTI (urinary tract infection)     Past Surgical History:  Procedure Laterality Date  . AORTIC AND MITRAL VALVE REPLACEMENT  1994   68mm St. Jude  . BACK SURGERY    . CARDIAC CATHETERIZATION  04/2010  . DOPPLER ECHOCARDIOGRAPHY  sept 06, 2013   aotric valve grad. peak 51 mmHg and mean 28 mm Hg  dimensional subtractive index 0.26; mitral valve grad peak 25 and mean 43mm Hg norm pressure halftime 75msec.  [name] MITRAL VALVE REPLACEMENT  1986   St Jude prosthesis   . NM MYOCAR PERF WALL MOTION  03/31/2008   EF 69% low risk  . NM MYOCAR PERF WALL MOTION  04/19/2006   EF 69%  . PACEMAKER PLACEMENT  05/2010  . TEE WITHOUT CARDIOVERSION  07/11/2012   Procedure: TRANSESOPHAGEAL ECHOCARDIOGRAM (TEE);  Surgeon: Sanda Klein, MD;  Location: [facility];  Service: Cardiovascular;  Laterality: N/A;    Outpatient Medications Prior to Visit  Medication Sig Dispense Refill  . acetaminophen (TYLENOL) 500 MG tablet Take 1,000 mg by mouth every 6 (six) hours as needed. For pain    . Ascorbic Acid (VITAMIN C) 1000 MG tablet Take 1,000 mg by mouth daily.    [name] atorvastatin (LIPITOR) 80 MG tablet TAKE 1/2 TABLET BY MOUTH AT BEDTIME 45 tablet 2  . Cholecalciferol (VITAMIN D3) 2000 UNITS capsule Take 2,000 Units by mouth daily.    . fluticasone (FLONASE) 50 MCG/ACT nasal spray Place 2 sprays into both nostrils 2 (two) times daily as needed.     . folic acid (FOLVITE) 1 MG tablet TAKE 1 TABLET BY MOUTH EVERY DAY 90 tablet 2  . levETIRAcetam (KEPPRA) 500 MG tablet Take 500 mg by mouth 2 (two) times daily.    [name] lisinopril (PRINIVIL,ZESTRIL) 30 MG tablet TAKE 1 TABLET BY MOUTH EVERY DAY 90 tablet 2  . omeprazole (PRILOSEC) 20 MG capsule Take 20 mg by mouth daily as needed.     [name] Glycol-Propyl Glycol (SYSTANE OP) Place 1 drop into both eyes daily as needed. For dry eyes    . vitamin B-12 (CYANOCOBALAMIN) 1000 MCG tablet Take 1,000 mcg by mouth daily.    [name] warfarin  (COUMADIN) 2.5 MG tablet TAKE 2 TABLETS BY MOUTH EVERY DAY OR AS DIRECTED BY COUMADIN CLINIC 60 tablet 5  . alendronate (FOSAMAX) 70 MG tablet Take 70 mg by mouth every 7 (seven) days. Take with a full glass of water on an empty stomach. Takes on Sunday.    . chlorthalidone (HYGROTON) 25 MG tablet Take 0.5 tablets (12.5 mg total) by mouth daily. (Patient not taking: Reported on 05/15/2017) 45 tablet 3  . pantoprazole (PROTONIX) 40 MG tablet Take 40 mg by mouth daily as needed. For heartburn     No facility-administered medications prior to visit.      Allergies:   Shellfish allergy and Tegretol [carbamazepine]   Social History   Social History  . Marital status: Married    Spouse name: N/A  . Number of children: 1  . Years of education: N/A   Occupational History  . DISABLED    Social History Main Topics  . Smoking status:  Never Smoker  . Smokeless tobacco: Never Used  . Alcohol use No  . Drug use: No  . Sexual activity: Not on file   Other Topics Concern  . Not on file   Social History Narrative   Lives at home with her husband.   Right-handed.   16-20oz of caffeine per day.     Family History:  The patient's family history includes Colon cancer in her sister; Colon polyps in her sister; Heart disease in her mother.   ROS:   Please see the history of present illness.    ROS All other systems reviewed and are negative.   PHYSICAL EXAM:   VS:  BP 138/76 (BP Location: Right Arm, Patient Position: Sitting)   Pulse 69   Ht 5\' 1"  (1.549 m)   Wt 151 lb 9.6 oz (68.8 kg)   BMI 28.64 kg/m     General: Alert, oriented x3, no distress Head: no evidence of trauma, PERRL, EOMI, no exophtalmos or lid lag, no myxedema, no xanthelasma; normal ears, nose and oropharynx Neck: normal jugular venous pulsations and no hepatojugular reflux; brisk carotid pulses without delay and no carotid bruits Chest: clear to auscultation, no signs of consolidation by percussion or palpation,  normal fremitus, symmetrical and full respiratory excursions Cardiovascular: normal position and quality of the apical impulse, regular rhythm, normal first and paradoxically split second heart sounds, crisp mechanical valve prosthetic clicks, 0-1/7 systolic ejection murmur in the aortic focus, no diastolic murmurs, rubs, or gallops,no edema ; healthy subclavian pacemaker site Abdomen: no tenderness or distention, no masses by palpation, no abnormal pulsatility or arterial bruits, normal bowel sounds, no hepatosplenomegaly Extremities: no clubbing, cyanosis or edema; 2+ radial, ulnar and brachial pulses bilaterally; 2+ right femoral, posterior tibial and dorsalis pedis pulses; 2+ left femoral, posterior tibial and dorsalis pedis pulses; no subclavian or femoral bruits Neurological: grossly nonfocal  Psych: euthymic mood, full affect  Wt Readings from Last 3 Encounters:  05/15/17 151 lb 9.6 oz (68.8 kg)  10/15/16 149 lb (67.6 kg)  07/04/16 149 lb 8 oz (67.8 kg)      Studies/Labs Reviewed:   EKG:  EKG is ordered today.  The ekg ordered today demonstrates background atrial fibrillation and 100% ventricular pacing  Recent Labs: 11/04/2015 Total cholesterol 147, triglycerides 87, HDL 85, LDL 45 Glucose 87, BUN 12, creatinine 0.95, normal LFTs, hemoglobin 11.8, platelets 120 3K, TSH 5.05  ASSESSMENT:    1. Pacemaker: Medtronic 2011 for tachy brady AFIB   2. Permanent atrial fibrillation ([place])   3. NSVT (nonsustained ventricular tachycardia) ([place])   4. Hx of mechanical aortic valve replacement   5. History of mitral valve replacement, mechanical   6. Long term current use of anticoagulant therapy   7. Essential hypertension      PLAN:  In order of problems listed above:  1. Permanent atrial fibrillation: Extremely high embolic risk due to the presence of mechanical aortic and mitral valve prostheses. Anticoagulation should only be interpreted if absolutely necessary. If it is  interrupted, bridging with enoxaparin should be performed. For example, for her dental extraction it is preferable that she be fully anticoagulated, just making sure that her INR is not excessively high.  2. PPM: Normal device function with virtually 100% ventricular pacing, no changes made to device settings. Continue remote downloads every 3 months and yearly office visit. 3. NSVT: She continues with a similar pattern of roughly once a month nonsustained VT, episodes consistently asymptomatic. Is no temporal association between  this arrhythmia and her unsteadiness/dizziness 4. Mechanical AVR: Some concern of aortic valve mismatch. Despite small prosthetic valve size area valve gradients are actually fairly low (peak 30, mean 18) and stable over time. The dimensionless obstructive index is low at 0.23, consistent with moderate to severe obstruction. 5. Mechanical MVR: Normal by echo in February 2017. She is aware of the need to take antibiotic prophylaxis before dental procedures or other interventions that can be complicated by bacteremia. 6. Warfarin: Needs to continue anticoagulation. Thankfully, despite her dizziness and unsteadiness she has not had serious falls or injuries 7. HTN: Blood pressure is fairly well controlled today. I would not increase the blood pressure medications     Medication Adjustments/Labs and Tests Ordered: Current medicines are reviewed at length with the patient today.  Concerns regarding medicines are outlined above.  Medication changes, Labs and Tests ordered today are listed in the Patient Instructions below. There are no Patient Instructions on file for this visit.     Signed, Sanda Klein, MD  05/17/2017 1:53 PM    [facility] [address], [place], [place]  [address] Phone: [phone]; Fax: [phone]

## 2017-05-17 ENCOUNTER — Encounter: Payer: Self-pay | Admitting: Cardiovascular Disease

## 2017-05-17 DIAGNOSIS — I472 Ventricular tachycardia: Secondary | ICD-10-CM | POA: Insufficient documentation

## 2017-05-17 DIAGNOSIS — I4729 Other ventricular tachycardia: Secondary | ICD-10-CM | POA: Insufficient documentation

## 2017-05-22 ENCOUNTER — Other Ambulatory Visit: Payer: Self-pay | Admitting: Cardiovascular Disease

## 2017-05-25 DIAGNOSIS — I10 Essential (primary) hypertension: Secondary | ICD-10-CM | POA: Diagnosis not present

## 2017-05-25 DIAGNOSIS — N183 Chronic kidney disease, stage 3 (moderate): Secondary | ICD-10-CM | POA: Diagnosis not present

## 2017-05-25 DIAGNOSIS — K219 Gastro-esophageal reflux disease without esophagitis: Secondary | ICD-10-CM | POA: Diagnosis not present

## 2017-05-25 DIAGNOSIS — E782 Mixed hyperlipidemia: Secondary | ICD-10-CM | POA: Diagnosis not present

## 2017-05-29 DIAGNOSIS — I10 Essential (primary) hypertension: Secondary | ICD-10-CM | POA: Diagnosis not present

## 2017-05-29 DIAGNOSIS — E782 Mixed hyperlipidemia: Secondary | ICD-10-CM | POA: Diagnosis not present

## 2017-05-29 DIAGNOSIS — E039 Hypothyroidism, unspecified: Secondary | ICD-10-CM | POA: Diagnosis not present

## 2017-05-29 DIAGNOSIS — I482 Chronic atrial fibrillation: Secondary | ICD-10-CM | POA: Diagnosis not present

## 2017-06-03 DIAGNOSIS — R300 Dysuria: Secondary | ICD-10-CM | POA: Diagnosis not present

## 2017-06-17 ENCOUNTER — Ambulatory Visit (INDEPENDENT_AMBULATORY_CARE_PROVIDER_SITE_OTHER): Payer: Medicare Other | Admitting: Pharmacist Clinician (PhC)/ Clinical Pharmacy Specialist

## 2017-06-17 ENCOUNTER — Ambulatory Visit: Payer: Medicare Other | Admitting: Neurology

## 2017-06-17 ENCOUNTER — Telehealth: Payer: Self-pay | Admitting: Cardiovascular Disease

## 2017-06-17 ENCOUNTER — Encounter: Payer: Self-pay | Admitting: Cardiovascular Disease

## 2017-06-17 DIAGNOSIS — I4891 Unspecified atrial fibrillation: Secondary | ICD-10-CM | POA: Diagnosis not present

## 2017-06-17 DIAGNOSIS — Z952 Presence of prosthetic heart valve: Secondary | ICD-10-CM | POA: Diagnosis not present

## 2017-06-17 DIAGNOSIS — Z7901 Long term (current) use of anticoagulants: Secondary | ICD-10-CM

## 2017-06-17 LAB — POCT INR: INR: 1.6

## 2017-06-17 NOTE — Telephone Encounter (Signed)
Davina Poke and Raquel, procedure date is Sept 4. [place], she will need a rx for amoxicillin. Thanks [employer]

## 2017-06-17 NOTE — Telephone Encounter (Signed)
New Message.     [facility] Pre-operative Risk Assessment    Request for surgical clearance:  1. What type of surgery is being performed? Teeth extraction  2. When is this surgery scheduled? 07/09/17  Are there any medications that need to be held prior to surgery and how long? Coumadin TBD  3. Name of physician performing surgery? Maury Dus   4. What is your office phone and fax number? [phone] fax [phone]  Tamera Reason 06/17/2017, 4:21 PM  _________________________________________________________________   (provider comments below)

## 2017-06-18 MED ORDER — ENOXAPARIN SODIUM 60 MG/0.6ML ~~LOC~~ SOLN: 60.0000 mg | Freq: Two times a day (BID) | SUBCUTANEOUS | 0 refills | Status: DC

## 2017-06-18 NOTE — Telephone Encounter (Signed)
Patient with diagnosis of MVR/AVR on warfarin for anticoagulation.    Procedure: multiple teeth extraction Date of procedure: Sept 4  CrCl 53 Platelet count 160  Per office protocol, patient can hold warfarin for 5 days prior to procedure.   Patient will need bridging with Lovenox (enoxaparin) around procedure.  Enoxaparin dose 60 mg bid, instructions to be given at next INR check

## 2017-06-19 MED ORDER — AMOXICILLIN 500 MG PO CAPS: 2000.0000 mg | ORAL_CAPSULE | Freq: Once | ORAL | 0 refills | Status: AC

## 2017-06-19 NOTE — Telephone Encounter (Signed)
Recommendations faxed to Dr Valetta Mole office

## 2017-06-19 NOTE — Telephone Encounter (Signed)
New Message    Dental office calling back for instruction for blood thinner.

## 2017-06-19 NOTE — Telephone Encounter (Signed)
Patient with diagnosis of MVR/AVR on warfarin for anticoagulation.    Procedure: multiple teeth extraction Date of procedure: Sept 4  CrCl 53 Platelet count 160  Per office protocol, patient can hold warfarin for 5 days prior to procedure.   Patient will need bridging with Lovenox (enoxaparin) around procedure.  Enoxaparin dose 60 mg bid, instructions to be given at next INR check

## 2017-06-25 LAB — CUP PACEART INCLINIC DEVICE CHECK
Battery Impedance: 524 Ohm
Battery Voltage: 2.79 V
Date Time Interrogation Session: 20180821125541
Implantable Lead Implant Date: 20110701
Implantable Lead Location: 753860
Implantable Lead Model: 5076
Implantable Pulse Generator Implant Date: 20110701
Lead Channel Pacing Threshold Amplitude: 0.75 V
Lead Channel Pacing Threshold Pulse Width: 0.4 ms
Lead Channel Setting Pacing Pulse Width: 0.4 ms
MDC IDC LEAD IMPLANT DT: 20110701
MDC IDC LEAD LOCATION: 753859
MDC IDC MSMT BATTERY REMAINING LONGEVITY: 84 mo
MDC IDC MSMT LEADCHNL RV IMPEDANCE VALUE: 517 Ohm
MDC IDC MSMT LEADCHNL RV SENSING INTR AMPL: 11.2 mV
MDC IDC SET LEADCHNL RV PACING AMPLITUDE: 2.5 V
MDC IDC SET LEADCHNL RV SENSING SENSITIVITY: 2.8 mV
MDC IDC STAT BRADY RV PERCENT PACED: 99 %

## 2017-07-02 ENCOUNTER — Ambulatory Visit (INDEPENDENT_AMBULATORY_CARE_PROVIDER_SITE_OTHER): Payer: Medicare Other | Admitting: Pharmacist Clinician (PhC)/ Clinical Pharmacy Specialist

## 2017-07-02 DIAGNOSIS — I4891 Unspecified atrial fibrillation: Secondary | ICD-10-CM

## 2017-07-02 DIAGNOSIS — Z952 Presence of prosthetic heart valve: Secondary | ICD-10-CM

## 2017-07-02 DIAGNOSIS — Z7901 Long term (current) use of anticoagulants: Secondary | ICD-10-CM

## 2017-07-02 LAB — POCT INR: INR: 2.5

## 2017-07-02 MED ORDER — ENOXAPARIN SODIUM 60 MG/0.6ML ~~LOC~~ SOLN: SUBCUTANEOUS | 0 refills | Status: DC

## 2017-07-02 NOTE — Patient Instructions (Addendum)
Aug 29: Last dose of Coumadin.  Aug 30: No Coumadin or Lovenox.  Aug 31: Inject Lovenox 60 mg in the fatty abdominal tissue at least 2 inches from the belly button twice a day about 12 hours apart, 8am and 8pm rotate sites. No Coumadin.  Sept 1: Inject Lovenox in the fatty tissue every 12 hours, 8am and 8pm. No Coumadin.  Sept 2: Inject Lovenox in the fatty tissue every 12 hours, 8am and 8pm. No Coumadin.  Sept 3: Inject Lovenox in the fatty tissue in the morning at 8 am (No PM dose). No Coumadin.  Sept 4: Procedure Day - No Lovenox - Resume Coumadin in the evening or as directed by doctor.  Take warfarin 5 mg (2 tabs)  Sept 5: Resume Lovenox inject in the fatty tissue every 12 hours and take Coumadin 5 mg (2 tabs).  Sept 6: Inject Lovenox in the fatty tissue every 12 hours and take Coumadin 5 mg (2 tabs)  Sept 7: Inject Lovenox in the fatty tissue every 12 hours and take Coumadin 3.75 mg (1.5 tabs)  Sept 8: Inject Lovenox in the fatty tissue every 12 hours and take Coumadin 5 mg (2 tabs)  Sept 9: Inject Lovenox in the fatty tissue every 12 hours and take Coumadin 3.75 mg ( 1.5 tabs)  Sept 10: Coumadin appt to check INR.

## 2017-07-04 ENCOUNTER — Other Ambulatory Visit: Payer: Self-pay | Admitting: Cardiovascular Disease

## 2017-07-14 DIAGNOSIS — I10 Essential (primary) hypertension: Secondary | ICD-10-CM | POA: Diagnosis not present

## 2017-07-14 DIAGNOSIS — R569 Unspecified convulsions: Secondary | ICD-10-CM | POA: Diagnosis not present

## 2017-07-14 DIAGNOSIS — Z952 Presence of prosthetic heart valve: Secondary | ICD-10-CM | POA: Diagnosis not present

## 2017-07-14 DIAGNOSIS — Z7901 Long term (current) use of anticoagulants: Secondary | ICD-10-CM | POA: Diagnosis not present

## 2017-07-14 DIAGNOSIS — Z79899 Other long term (current) drug therapy: Secondary | ICD-10-CM | POA: Diagnosis not present

## 2017-07-14 DIAGNOSIS — K91841 Postprocedural hemorrhage and hematoma of a digestive system organ or structure following other procedure: Secondary | ICD-10-CM | POA: Diagnosis not present

## 2017-07-14 DIAGNOSIS — K219 Gastro-esophageal reflux disease without esophagitis: Secondary | ICD-10-CM | POA: Diagnosis not present

## 2017-07-14 DIAGNOSIS — Z950 Presence of cardiac pacemaker: Secondary | ICD-10-CM | POA: Diagnosis not present

## 2017-07-15 ENCOUNTER — Ambulatory Visit (INDEPENDENT_AMBULATORY_CARE_PROVIDER_SITE_OTHER): Payer: Medicare Other | Admitting: Pharmacist

## 2017-07-15 DIAGNOSIS — I4891 Unspecified atrial fibrillation: Secondary | ICD-10-CM | POA: Diagnosis not present

## 2017-07-15 DIAGNOSIS — Z952 Presence of prosthetic heart valve: Secondary | ICD-10-CM

## 2017-07-15 DIAGNOSIS — Z7901 Long term (current) use of anticoagulants: Secondary | ICD-10-CM | POA: Diagnosis not present

## 2017-07-15 LAB — POCT INR: INR: 1.1

## 2017-07-23 ENCOUNTER — Ambulatory Visit (INDEPENDENT_AMBULATORY_CARE_PROVIDER_SITE_OTHER): Payer: Medicare Other | Admitting: Pharmacist Clinician (PhC)/ Clinical Pharmacy Specialist

## 2017-07-23 DIAGNOSIS — I481 Persistent atrial fibrillation: Secondary | ICD-10-CM

## 2017-07-23 DIAGNOSIS — Z7901 Long term (current) use of anticoagulants: Secondary | ICD-10-CM | POA: Diagnosis not present

## 2017-07-23 DIAGNOSIS — Z952 Presence of prosthetic heart valve: Secondary | ICD-10-CM | POA: Diagnosis not present

## 2017-07-23 DIAGNOSIS — I4891 Unspecified atrial fibrillation: Secondary | ICD-10-CM | POA: Diagnosis not present

## 2017-07-23 DIAGNOSIS — I4819 Other persistent atrial fibrillation: Secondary | ICD-10-CM

## 2017-07-23 LAB — POCT INR: INR: 1.3

## 2017-07-31 ENCOUNTER — Ambulatory Visit (INDEPENDENT_AMBULATORY_CARE_PROVIDER_SITE_OTHER): Payer: Medicare Other | Admitting: Pharmacist Clinician (PhC)/ Clinical Pharmacy Specialist

## 2017-07-31 DIAGNOSIS — Z952 Presence of prosthetic heart valve: Secondary | ICD-10-CM

## 2017-07-31 DIAGNOSIS — Z7901 Long term (current) use of anticoagulants: Secondary | ICD-10-CM | POA: Diagnosis not present

## 2017-07-31 DIAGNOSIS — I4891 Unspecified atrial fibrillation: Secondary | ICD-10-CM | POA: Diagnosis not present

## 2017-07-31 LAB — POCT INR: INR: 1.6

## 2017-08-14 ENCOUNTER — Telehealth: Payer: Self-pay | Admitting: Cardiology

## 2017-08-14 ENCOUNTER — Ambulatory Visit (INDEPENDENT_AMBULATORY_CARE_PROVIDER_SITE_OTHER): Payer: Medicare Other | Admitting: Pharmacist

## 2017-08-14 ENCOUNTER — Ambulatory Visit (INDEPENDENT_AMBULATORY_CARE_PROVIDER_SITE_OTHER): Payer: Medicare Other | Admitting: *Deleted

## 2017-08-14 DIAGNOSIS — Z952 Presence of prosthetic heart valve: Secondary | ICD-10-CM | POA: Diagnosis not present

## 2017-08-14 DIAGNOSIS — I4891 Unspecified atrial fibrillation: Secondary | ICD-10-CM | POA: Diagnosis not present

## 2017-08-14 DIAGNOSIS — I480 Paroxysmal atrial fibrillation: Secondary | ICD-10-CM

## 2017-08-14 DIAGNOSIS — Z7901 Long term (current) use of anticoagulants: Secondary | ICD-10-CM

## 2017-08-14 LAB — POCT INR: INR: 1.5

## 2017-08-14 NOTE — Telephone Encounter (Signed)
LMOVM reminding pt to send remote transmission.   

## 2017-08-14 NOTE — Progress Notes (Signed)
Remote pacemaker transmission.   

## 2017-08-16 ENCOUNTER — Encounter: Payer: Self-pay | Admitting: Cardiology

## 2017-08-20 LAB — CUP PACEART REMOTE DEVICE CHECK
Battery Voltage: 2.79 V
Brady Statistic RV Percent Paced: 99 %
Implantable Lead Implant Date: 20110701
Implantable Lead Implant Date: 20110701
Implantable Lead Model: 5076
Lead Channel Impedance Value: 476 Ohm
Lead Channel Impedance Value: 67 Ohm
Lead Channel Pacing Threshold Amplitude: 0.625 V
Lead Channel Setting Pacing Amplitude: 2.5 V
Lead Channel Setting Pacing Pulse Width: 0.4 ms
MDC IDC LEAD LOCATION: 753859
MDC IDC LEAD LOCATION: 753860
MDC IDC MSMT BATTERY IMPEDANCE: 574 Ohm
MDC IDC MSMT BATTERY REMAINING LONGEVITY: 83 mo
MDC IDC MSMT LEADCHNL RV PACING THRESHOLD PULSEWIDTH: 0.4 ms
MDC IDC PG IMPLANT DT: 20110701
MDC IDC SESS DTM: 20181010180242
MDC IDC SET LEADCHNL RV SENSING SENSITIVITY: 2 mV

## 2017-08-28 ENCOUNTER — Ambulatory Visit (INDEPENDENT_AMBULATORY_CARE_PROVIDER_SITE_OTHER): Payer: Medicare Other | Admitting: Pharmacist Clinician (PhC)/ Clinical Pharmacy Specialist

## 2017-08-28 DIAGNOSIS — I4891 Unspecified atrial fibrillation: Secondary | ICD-10-CM | POA: Diagnosis not present

## 2017-08-28 DIAGNOSIS — Z7901 Long term (current) use of anticoagulants: Secondary | ICD-10-CM | POA: Diagnosis not present

## 2017-08-28 DIAGNOSIS — Z952 Presence of prosthetic heart valve: Secondary | ICD-10-CM | POA: Diagnosis not present

## 2017-08-28 LAB — POCT INR: INR: 2.2

## 2017-08-29 DIAGNOSIS — Z23 Encounter for immunization: Secondary | ICD-10-CM | POA: Diagnosis not present

## 2017-09-05 ENCOUNTER — Ambulatory Visit: Payer: Medicare Other | Admitting: Adult Health

## 2017-09-23 ENCOUNTER — Ambulatory Visit (INDEPENDENT_AMBULATORY_CARE_PROVIDER_SITE_OTHER): Payer: Medicare Other | Admitting: Pharmacist

## 2017-09-23 DIAGNOSIS — Z7901 Long term (current) use of anticoagulants: Secondary | ICD-10-CM | POA: Diagnosis not present

## 2017-09-23 DIAGNOSIS — Z952 Presence of prosthetic heart valve: Secondary | ICD-10-CM

## 2017-09-23 DIAGNOSIS — I4891 Unspecified atrial fibrillation: Secondary | ICD-10-CM | POA: Diagnosis not present

## 2017-09-23 LAB — POCT INR: INR: 1.6

## 2017-10-03 DIAGNOSIS — I482 Chronic atrial fibrillation: Secondary | ICD-10-CM | POA: Diagnosis not present

## 2017-10-03 DIAGNOSIS — I10 Essential (primary) hypertension: Secondary | ICD-10-CM | POA: Diagnosis not present

## 2017-10-03 DIAGNOSIS — J019 Acute sinusitis, unspecified: Secondary | ICD-10-CM | POA: Diagnosis not present

## 2017-10-25 ENCOUNTER — Ambulatory Visit (INDEPENDENT_AMBULATORY_CARE_PROVIDER_SITE_OTHER): Payer: Medicare Other | Admitting: Pharmacist Clinician (PhC)/ Clinical Pharmacy Specialist

## 2017-10-25 DIAGNOSIS — Z7901 Long term (current) use of anticoagulants: Secondary | ICD-10-CM

## 2017-10-25 DIAGNOSIS — I4891 Unspecified atrial fibrillation: Secondary | ICD-10-CM | POA: Diagnosis not present

## 2017-10-25 DIAGNOSIS — Z952 Presence of prosthetic heart valve: Secondary | ICD-10-CM | POA: Diagnosis not present

## 2017-10-25 LAB — POCT INR: INR: 3.4

## 2017-10-25 NOTE — Patient Instructions (Signed)
Description   No warfarin today Friday Dec 21, then continue with 2 tablets daily except 1.5 tablets each Monday and Friday.  Repeat INR in 3 weeks.

## 2017-11-13 ENCOUNTER — Ambulatory Visit (INDEPENDENT_AMBULATORY_CARE_PROVIDER_SITE_OTHER): Payer: Medicare Other | Admitting: *Deleted

## 2017-11-13 DIAGNOSIS — I480 Paroxysmal atrial fibrillation: Secondary | ICD-10-CM | POA: Diagnosis not present

## 2017-11-13 NOTE — Progress Notes (Signed)
Remote pacemaker transmission.   

## 2017-11-14 ENCOUNTER — Encounter: Payer: Self-pay | Admitting: Cardiology

## 2017-11-14 LAB — CUP PACEART REMOTE DEVICE CHECK
Battery Impedance: 700 Ohm
Battery Remaining Longevity: 76 mo
Battery Voltage: 2.79 V
Brady Statistic RV Percent Paced: 99 %
Date Time Interrogation Session: 20190109143736
Implantable Lead Implant Date: 20110701
Implantable Lead Implant Date: 20110701
Implantable Lead Location: 753859
Implantable Lead Location: 753860
Implantable Lead Model: 5076
Implantable Lead Model: 5076
Implantable Pulse Generator Implant Date: 20110701
Lead Channel Impedance Value: 518 Ohm
Lead Channel Impedance Value: 67 Ohm
Lead Channel Pacing Threshold Amplitude: 0.625 V
Lead Channel Pacing Threshold Pulse Width: 0.4 ms
Lead Channel Setting Pacing Amplitude: 2.5 V
Lead Channel Setting Pacing Pulse Width: 0.4 ms
Lead Channel Setting Sensing Sensitivity: 2.8 mV

## 2017-11-15 ENCOUNTER — Ambulatory Visit (INDEPENDENT_AMBULATORY_CARE_PROVIDER_SITE_OTHER): Payer: Medicare Other | Admitting: Pharmacist

## 2017-11-15 DIAGNOSIS — Z7901 Long term (current) use of anticoagulants: Secondary | ICD-10-CM | POA: Diagnosis not present

## 2017-11-15 DIAGNOSIS — I4891 Unspecified atrial fibrillation: Secondary | ICD-10-CM

## 2017-11-15 DIAGNOSIS — Z952 Presence of prosthetic heart valve: Secondary | ICD-10-CM

## 2017-11-15 LAB — POCT INR: INR: 3.4

## 2017-11-21 DIAGNOSIS — H40013 Open angle with borderline findings, low risk, bilateral: Secondary | ICD-10-CM | POA: Diagnosis not present

## 2017-11-29 ENCOUNTER — Ambulatory Visit (INDEPENDENT_AMBULATORY_CARE_PROVIDER_SITE_OTHER): Payer: Medicare Other | Admitting: Pharmacist

## 2017-11-29 DIAGNOSIS — Z7901 Long term (current) use of anticoagulants: Secondary | ICD-10-CM | POA: Diagnosis not present

## 2017-11-29 DIAGNOSIS — I4891 Unspecified atrial fibrillation: Secondary | ICD-10-CM

## 2017-11-29 DIAGNOSIS — Z952 Presence of prosthetic heart valve: Secondary | ICD-10-CM | POA: Diagnosis not present

## 2017-11-29 LAB — POCT INR: INR: 1.4

## 2017-12-09 DIAGNOSIS — E039 Hypothyroidism, unspecified: Secondary | ICD-10-CM | POA: Diagnosis not present

## 2017-12-09 DIAGNOSIS — E559 Vitamin D deficiency, unspecified: Secondary | ICD-10-CM | POA: Diagnosis not present

## 2017-12-09 DIAGNOSIS — D126 Benign neoplasm of colon, unspecified: Secondary | ICD-10-CM | POA: Diagnosis not present

## 2017-12-09 DIAGNOSIS — D649 Anemia, unspecified: Secondary | ICD-10-CM | POA: Diagnosis not present

## 2017-12-09 DIAGNOSIS — E782 Mixed hyperlipidemia: Secondary | ICD-10-CM | POA: Diagnosis not present

## 2017-12-09 DIAGNOSIS — Z0001 Encounter for general adult medical examination with abnormal findings: Secondary | ICD-10-CM | POA: Diagnosis not present

## 2017-12-12 ENCOUNTER — Other Ambulatory Visit: Payer: Self-pay | Admitting: Pharmacist Clinician (PhC)/ Clinical Pharmacy Specialist

## 2017-12-12 DIAGNOSIS — D126 Benign neoplasm of colon, unspecified: Secondary | ICD-10-CM | POA: Diagnosis not present

## 2017-12-12 DIAGNOSIS — Z0001 Encounter for general adult medical examination with abnormal findings: Secondary | ICD-10-CM | POA: Diagnosis not present

## 2017-12-12 DIAGNOSIS — E782 Mixed hyperlipidemia: Secondary | ICD-10-CM | POA: Diagnosis not present

## 2017-12-12 DIAGNOSIS — E039 Hypothyroidism, unspecified: Secondary | ICD-10-CM | POA: Diagnosis not present

## 2017-12-12 DIAGNOSIS — E559 Vitamin D deficiency, unspecified: Secondary | ICD-10-CM | POA: Diagnosis not present

## 2017-12-12 MED ORDER — WARFARIN SODIUM 2.5 MG PO TABS: ORAL_TABLET | ORAL | 5 refills | Status: DC

## 2017-12-18 ENCOUNTER — Ambulatory Visit (INDEPENDENT_AMBULATORY_CARE_PROVIDER_SITE_OTHER): Payer: Medicare Other | Admitting: Pharmacist Clinician (PhC)/ Clinical Pharmacy Specialist

## 2017-12-18 DIAGNOSIS — Z7901 Long term (current) use of anticoagulants: Secondary | ICD-10-CM | POA: Diagnosis not present

## 2017-12-18 DIAGNOSIS — Z952 Presence of prosthetic heart valve: Secondary | ICD-10-CM | POA: Diagnosis not present

## 2017-12-18 DIAGNOSIS — I4891 Unspecified atrial fibrillation: Secondary | ICD-10-CM

## 2017-12-18 LAB — POCT INR: INR: 1.6

## 2017-12-18 NOTE — Patient Instructions (Signed)
Description   Increase dose to 2 tablets daily except 1.5 tablets each Monday and Friday.  Repeat INR in 3 weeks

## 2017-12-31 ENCOUNTER — Other Ambulatory Visit: Payer: Self-pay | Admitting: Cardiovascular Disease

## 2018-01-10 ENCOUNTER — Ambulatory Visit (INDEPENDENT_AMBULATORY_CARE_PROVIDER_SITE_OTHER): Payer: Medicare Other | Admitting: Pharmacist

## 2018-01-10 DIAGNOSIS — I4891 Unspecified atrial fibrillation: Secondary | ICD-10-CM | POA: Diagnosis not present

## 2018-01-10 DIAGNOSIS — Z7901 Long term (current) use of anticoagulants: Secondary | ICD-10-CM | POA: Diagnosis not present

## 2018-01-10 DIAGNOSIS — Z952 Presence of prosthetic heart valve: Secondary | ICD-10-CM

## 2018-01-10 LAB — POCT INR: INR: 1.3

## 2018-01-21 DIAGNOSIS — M810 Age-related osteoporosis without current pathological fracture: Secondary | ICD-10-CM | POA: Diagnosis not present

## 2018-02-07 ENCOUNTER — Ambulatory Visit (INDEPENDENT_AMBULATORY_CARE_PROVIDER_SITE_OTHER): Payer: Medicare Other | Admitting: Pharmacist Clinician (PhC)/ Clinical Pharmacy Specialist

## 2018-02-07 DIAGNOSIS — Z952 Presence of prosthetic heart valve: Secondary | ICD-10-CM | POA: Diagnosis not present

## 2018-02-07 DIAGNOSIS — I4891 Unspecified atrial fibrillation: Secondary | ICD-10-CM | POA: Diagnosis not present

## 2018-02-07 DIAGNOSIS — Z7901 Long term (current) use of anticoagulants: Secondary | ICD-10-CM | POA: Diagnosis not present

## 2018-02-07 LAB — POCT INR: INR: 3.7

## 2018-02-07 NOTE — Patient Instructions (Signed)
Description   Hold warfarin today Friday April 5, then take 2 tablets daily except 1 tablet each Saturday.  Repeat INR in 3 weeks    Call Erasmo Downer or Raquel at [phone] if you need to re-schedule

## 2018-02-12 ENCOUNTER — Encounter: Payer: Medicare Other | Admitting: *Deleted

## 2018-02-12 ENCOUNTER — Telehealth: Payer: Self-pay | Admitting: Cardiology

## 2018-02-12 NOTE — Telephone Encounter (Signed)
Attempted to confirm remote transmission with pt. No answer and was unable to leave a message.   

## 2018-02-13 ENCOUNTER — Encounter: Payer: Self-pay | Admitting: Cardiology

## 2018-02-13 NOTE — Progress Notes (Signed)
Letter  

## 2018-02-26 ENCOUNTER — Ambulatory Visit (INDEPENDENT_AMBULATORY_CARE_PROVIDER_SITE_OTHER): Payer: Medicare Other | Admitting: Pharmacist

## 2018-02-26 ENCOUNTER — Telehealth: Payer: Self-pay | Admitting: Cardiology

## 2018-02-26 ENCOUNTER — Ambulatory Visit (INDEPENDENT_AMBULATORY_CARE_PROVIDER_SITE_OTHER): Payer: Medicare Other | Admitting: *Deleted

## 2018-02-26 DIAGNOSIS — I480 Paroxysmal atrial fibrillation: Secondary | ICD-10-CM

## 2018-02-26 DIAGNOSIS — Z7901 Long term (current) use of anticoagulants: Secondary | ICD-10-CM

## 2018-02-26 DIAGNOSIS — I4891 Unspecified atrial fibrillation: Secondary | ICD-10-CM | POA: Diagnosis not present

## 2018-02-26 DIAGNOSIS — Z952 Presence of prosthetic heart valve: Secondary | ICD-10-CM | POA: Diagnosis not present

## 2018-02-26 LAB — POCT INR: INR: 1.8

## 2018-02-26 NOTE — Telephone Encounter (Signed)
LM w/ pt husband for pt to call back.

## 2018-02-26 NOTE — Patient Instructions (Signed)
Description   Take 3 tablets today then take 2 tablets daily except 1 tablet each Saturday.  Repeat INR in 3 weeks

## 2018-02-26 NOTE — Telephone Encounter (Signed)
Spoke w/ pt and informed her that her remote transmission was not received. Pt tried to send remote transmission again while I was on the phone with her. Transmission was received successfully.

## 2018-02-26 NOTE — Progress Notes (Signed)
Remote pacemaker transmission.   

## 2018-02-27 DIAGNOSIS — H40013 Open angle with borderline findings, low risk, bilateral: Secondary | ICD-10-CM | POA: Diagnosis not present

## 2018-02-27 DIAGNOSIS — H524 Presbyopia: Secondary | ICD-10-CM | POA: Diagnosis not present

## 2018-02-28 ENCOUNTER — Encounter: Payer: Self-pay | Admitting: Cardiology

## 2018-02-28 LAB — CUP PACEART REMOTE DEVICE CHECK
Battery Impedance: 674 Ohm
Battery Remaining Longevity: 76 mo
Battery Voltage: 2.79 V
Brady Statistic RV Percent Paced: 99 %
Date Time Interrogation Session: 20190424195056
Implantable Lead Implant Date: 20110701
Implantable Lead Implant Date: 20110701
Implantable Lead Location: 753859
Implantable Lead Location: 753860
Implantable Lead Model: 5076
Implantable Lead Model: 5076
Implantable Pulse Generator Implant Date: 20110701
Lead Channel Impedance Value: 491 Ohm
Lead Channel Impedance Value: 67 Ohm
Lead Channel Pacing Threshold Amplitude: 0.625 V
Lead Channel Pacing Threshold Pulse Width: 0.4 ms
Lead Channel Setting Pacing Amplitude: 2.5 V
Lead Channel Setting Pacing Pulse Width: 0.4 ms
Lead Channel Setting Sensing Sensitivity: 4 mV

## 2018-03-13 DIAGNOSIS — H40021 Open angle with borderline findings, high risk, right eye: Secondary | ICD-10-CM | POA: Diagnosis not present

## 2018-03-19 ENCOUNTER — Ambulatory Visit (INDEPENDENT_AMBULATORY_CARE_PROVIDER_SITE_OTHER): Payer: Medicare Other | Admitting: Pharmacist Clinician (PhC)/ Clinical Pharmacy Specialist

## 2018-03-19 DIAGNOSIS — Z952 Presence of prosthetic heart valve: Secondary | ICD-10-CM | POA: Diagnosis not present

## 2018-03-19 DIAGNOSIS — Z7901 Long term (current) use of anticoagulants: Secondary | ICD-10-CM | POA: Diagnosis not present

## 2018-03-19 DIAGNOSIS — I4891 Unspecified atrial fibrillation: Secondary | ICD-10-CM | POA: Diagnosis not present

## 2018-03-19 LAB — POCT INR: INR: 3.8

## 2018-03-24 ENCOUNTER — Other Ambulatory Visit: Payer: Self-pay | Admitting: Cardiovascular Disease

## 2018-03-24 NOTE — Telephone Encounter (Signed)
Rx has been sent to the pharmacy electronically. ° °

## 2018-04-09 ENCOUNTER — Ambulatory Visit (INDEPENDENT_AMBULATORY_CARE_PROVIDER_SITE_OTHER): Payer: Medicare Other | Admitting: Pharmacist Clinician (PhC)/ Clinical Pharmacy Specialist

## 2018-04-09 DIAGNOSIS — I4891 Unspecified atrial fibrillation: Secondary | ICD-10-CM

## 2018-04-09 DIAGNOSIS — Z7901 Long term (current) use of anticoagulants: Secondary | ICD-10-CM | POA: Diagnosis not present

## 2018-04-09 DIAGNOSIS — Z952 Presence of prosthetic heart valve: Secondary | ICD-10-CM | POA: Diagnosis not present

## 2018-04-09 LAB — POCT INR: INR: 2.9 (ref 2.0–3.0)

## 2018-04-09 NOTE — Patient Instructions (Signed)
Description   Continue with 2 tablets daily except 1 tablet each Saturday.  Repeat INR in 6 weeks     

## 2018-04-15 DIAGNOSIS — H401221 Low-tension glaucoma, left eye, mild stage: Secondary | ICD-10-CM | POA: Diagnosis not present

## 2018-05-21 ENCOUNTER — Ambulatory Visit (INDEPENDENT_AMBULATORY_CARE_PROVIDER_SITE_OTHER): Payer: Medicare Other | Admitting: Pharmacist

## 2018-05-21 ENCOUNTER — Encounter: Payer: Self-pay | Admitting: Cardiovascular Disease

## 2018-05-21 ENCOUNTER — Ambulatory Visit: Payer: Medicare Other | Admitting: Cardiovascular Disease

## 2018-05-21 VITALS — BP 134/68 | HR 72 | Ht 61.0 in | Wt 138.0 lb

## 2018-05-21 DIAGNOSIS — Z952 Presence of prosthetic heart valve: Secondary | ICD-10-CM

## 2018-05-21 DIAGNOSIS — Z950 Presence of cardiac pacemaker: Secondary | ICD-10-CM

## 2018-05-21 DIAGNOSIS — I4821 Permanent atrial fibrillation: Secondary | ICD-10-CM

## 2018-05-21 DIAGNOSIS — Z7901 Long term (current) use of anticoagulants: Secondary | ICD-10-CM

## 2018-05-21 DIAGNOSIS — I4729 Other ventricular tachycardia: Secondary | ICD-10-CM

## 2018-05-21 DIAGNOSIS — I482 Chronic atrial fibrillation: Secondary | ICD-10-CM

## 2018-05-21 DIAGNOSIS — I4891 Unspecified atrial fibrillation: Secondary | ICD-10-CM

## 2018-05-21 DIAGNOSIS — I10 Essential (primary) hypertension: Secondary | ICD-10-CM

## 2018-05-21 DIAGNOSIS — I472 Ventricular tachycardia: Secondary | ICD-10-CM

## 2018-05-21 LAB — CUP PACEART INCLINIC DEVICE CHECK
Brady Statistic RV Percent Paced: 99 %
Implantable Lead Implant Date: 20110701
Implantable Lead Location: 753860
Implantable Lead Model: 5076
Lead Channel Impedance Value: 516 Ohm
Lead Channel Pacing Threshold Amplitude: 0.75 V
Lead Channel Pacing Threshold Amplitude: 0.75 V
Lead Channel Pacing Threshold Pulse Width: 0.4 ms
Lead Channel Sensing Intrinsic Amplitude: 11.2 mV
Lead Channel Setting Pacing Amplitude: 2.5 V
MDC IDC LEAD IMPLANT DT: 20110701
MDC IDC LEAD LOCATION: 753859
MDC IDC MSMT BATTERY IMPEDANCE: 826 Ohm
MDC IDC MSMT BATTERY REMAINING LONGEVITY: 70 mo
MDC IDC MSMT BATTERY VOLTAGE: 2.78 V
MDC IDC MSMT LEADCHNL RA IMPEDANCE VALUE: 67 Ohm
MDC IDC MSMT LEADCHNL RV PACING THRESHOLD PULSEWIDTH: 0.4 ms
MDC IDC PG IMPLANT DT: 20110701
MDC IDC SESS DTM: 20190717132329
MDC IDC SET LEADCHNL RV PACING PULSEWIDTH: 0.4 ms
MDC IDC SET LEADCHNL RV SENSING SENSITIVITY: 2.8 mV

## 2018-05-21 LAB — POCT INR: INR: 2.1 (ref 2.0–3.0)

## 2018-05-21 NOTE — Progress Notes (Signed)
Patient ID: Alice Ballard, female   DOB: December 19, 1940, 77 y.o.   MRN: [id number]    Cardiology Office Note    Date:  05/23/2018   ID:  ROSENDA Ballard, DOB 04-06-1941, MRN [id number]  PCP:  Curlene Labrum, MD  Cardiologist:   Sanda Klein, MD   Chief Complaint  Patient presents with  . Follow-up    1 year.  . Sleeping Problem    Patient says that she falls asleep fast and is sleeping more than usual.    History of Present Illness:  Alice Ballard is a 77 y.o. 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   She had a lot of problems with bleeding following a dental extraction, but otherwise no bleeding problems, no falls or injuries.  The patient specifically denies any chest pain at rest exertion, dyspnea at rest or with exertion, orthopnea, paroxysmal nocturnal dyspnea, syncope, palpitations, focal neurological deficits, intermittent claudication, lower extremity edema, unexplained weight gain, cough, hemoptysis or wheezing. She does not have a history of embolic events, stroke/TIA.   Pacemaker check today showed 99.4% ventricular pacing and normal device function. Estimated device longevity 6 years (Medtronic Adapta implanted 2011, dual-chamber device now programmed VVIR). No high ventricular rates are seen. She is not pacemaker dependent, underlying rhythm is atrial fibrillation with slow ventricular conduction at roughly 50 bpm.  Alice Ballard is a 77 year old woman with a mechanical aortic and mitral valve prostheses as well as long-term persistent atrial fibrillation, hypertension, dyslipidemia. In 1986 her mitral valve was replaced with a 29 mm St. Jude prosthesis for rheumatic disease. In 1994 her aortic valve was replaced with a 19 mm St. Jude device. Has chronic moderate elevation in the valvular gradients across  the aortic valve, likely an expression of patient-valve mismatch. Fluoroscopy has shown normal disc motion. Previous right heart catheterization had shown only very mild pulmonary hypertension (mean PA pressure 25 mm Hg, wedge pressure 16 mm Hg) She has no history of coronary artery disease. She has a dual-chamber permanent pacemaker (Medtronic) that was implanted in 2011 for tachycardia-bradycardia syndrome, now programmed VVIR, virtually 100% V paced. She is on chronic warfarin anticoagulation both for her mechanical valves and for atrial fibrillation. She has not had serious bleeding complications or embolic events. She does not have a history of stroke.  Past Medical History:  Diagnosis Date  . Abnormality of gait 07/04/2016  . Cerebral vascular disease   . Coronary artery disease   . Excessive daytime sleepiness 07/04/2016  . Fatty liver   . Gait abnormality   . Gastric polyp   . GERD (gastroesophageal reflux disease)   . Hemorrhoids   . Hiatal hernia   . Hyperlipidemia   . Hypersomnolence   . Hypertension   . Pacemaker july 2011   medtronic adapta model # ADDRL 1,SERIAL [id number]  . Paroxysmal atrial fibrillation (HCC)   . Seizure disorder ([place])   . Sick sinus syndrome ([place])   . Spinal stenosis   . Tubular adenoma of colon 11/2010  . UTI (urinary tract infection)     Past Surgical History:  Procedure Laterality Date  . AORTIC AND MITRAL VALVE REPLACEMENT  1994   76mm St. Jude  . BACK SURGERY    . CARDIAC CATHETERIZATION  04/2010  . DOPPLER ECHOCARDIOGRAPHY  sept 06, 2013   aotric valve grad. peak 51 mmHg and mean  28 mm Hg  dimensional subtractive index 0.26; mitral valve grad peak 25 and mean 49mm Hg norm pressure halftime 34msec.  [name] MITRAL VALVE REPLACEMENT  1986   St Jude prosthesis   . NM MYOCAR PERF WALL MOTION  03/31/2008   EF 69% low risk  . NM MYOCAR PERF WALL MOTION  04/19/2006   EF 69%  . PACEMAKER PLACEMENT  05/2010  . TEE WITHOUT CARDIOVERSION  07/11/2012    Procedure: TRANSESOPHAGEAL ECHOCARDIOGRAM (TEE);  Surgeon: Sanda Klein, MD;  Location: [facility] ENDOSCOPY;  Service: Cardiovascular;  Laterality: N/A;    Outpatient Medications Prior to Visit  Medication Sig Dispense Refill  . acetaminophen (TYLENOL) 500 MG tablet Take 1,000 mg by mouth every 6 (six) hours as needed. For pain    . alendronate (FOSAMAX) 70 MG tablet Take 70 mg by mouth every 7 (seven) days. Take with a full glass of water on an empty stomach. Takes on Sunday.    . Ascorbic Acid (VITAMIN C) 1000 MG tablet Take 1,000 mg by mouth daily.    [name] atorvastatin (LIPITOR) 80 MG tablet TAKE 1/2 TABLET BY MOUTH AT BEDTIME 45 tablet 1  . chlorthalidone (HYGROTON) 25 MG tablet Take 0.5 tablets (12.5 mg total) by mouth daily. 45 tablet 3  . Cholecalciferol (VITAMIN D3) 2000 UNITS capsule Take 2,000 Units by mouth daily.    [name] enoxaparin (LOVENOX) 60 MG/0.6ML injection Inject 60 mg (1 syringe) subcutaneously every 12 hours as directed 20 Syringe 0  . fluticasone (FLONASE) 50 MCG/ACT nasal spray Place 2 sprays into both nostrils 2 (two) times daily as needed.     . folic acid (FOLVITE) 1 MG tablet TAKE ONE TABLET BY MOUTH EVERY DAY 90 tablet 0  . levETIRAcetam (KEPPRA) 500 MG tablet Take 500 mg by mouth 2 (two) times daily.    [name] lisinopril (PRINIVIL,ZESTRIL) 30 MG tablet TAKE ONE TABLET BY MOUTH EVERY DAY 90 tablet 1  . omeprazole (PRILOSEC) 20 MG capsule Take 20 mg by mouth daily as needed.     . pantoprazole (PROTONIX) 40 MG tablet Take 40 mg by mouth daily as needed. For heartburn    . Polyethyl Glycol-Propyl Glycol (SYSTANE OP) Place 1 drop into both eyes daily as needed. For dry eyes    . vitamin B-12 (CYANOCOBALAMIN) 1000 MCG tablet Take 1,000 mcg by mouth daily.    [name] warfarin (COUMADIN) 2.5 MG tablet Take 1-2 tablets by mouth daily as directed by coumadin clinic 60 tablet 5   No facility-administered medications prior to visit.      Allergies:   Shellfish allergy and Tegretol  [carbamazepine]   Social History   Socioeconomic History  . Marital status: Married    Spouse name: Not on file  . Number of children: 1  . Years of education: Not on file  . Highest education level: Not on file  Occupational History  . Occupation: DISABLED  Social Needs  . Financial resource strain: Not on file  . Food insecurity:    Worry: Not on file    Inability: Not on file  . Transportation needs:    Medical: Not on file    Non-medical: Not on file  Tobacco Use  . Smoking status: Never Smoker  . Smokeless tobacco: Never Used  Substance and Sexual Activity  . Alcohol use: No  . Drug use: No  . Sexual activity: Not on file  Lifestyle  . Physical activity:    Days per week: Not on file    Minutes  per session: Not on file  . Stress: Not on file  Relationships  . Social connections:    Talks on phone: Not on file    Gets together: Not on file    Attends religious service: Not on file    Active member of club or organization: Not on file    Attends meetings of clubs or organizations: Not on file    Relationship status: Not on file  Other Topics Concern  . Not on file  Social History Narrative   Lives at home with her husband.   Right-handed.   16-20oz of caffeine per day.     Family History:  The patient's family history includes Colon cancer in her sister; Colon polyps in her sister; Heart disease in her mother.   ROS:   Please see the history of present illness.    ROS All other systems reviewed and are negative   PHYSICAL EXAM:   VS:  BP 134/68 (BP Location: Left Arm, Patient Position: Sitting, Cuff Size: Normal)   Pulse 72   Ht 5\' 1"  (1.549 m)   Wt 138 lb (62.6 kg)   BMI 26.07 kg/m      General: Alert, oriented x3, no distress, appears well Head: no evidence of trauma, PERRL, EOMI, no exophtalmos or lid lag, no myxedema, no xanthelasma; normal ears, nose and oropharynx Neck: normal jugular venous pulsations and no hepatojugular reflux; brisk  carotid pulses without delay and no carotid bruits Chest: clear to auscultation, no signs of consolidation by percussion or palpation, normal fremitus, symmetrical and full respiratory excursions.  Healthy left subclavian pacemaker site Cardiovascular: normal position and quality of the apical impulse, regular rhythm, normal first and paradoxically split second heart sounds, with very crisp prosthetic mechanical sounds, no diastolic or systolic murmurs, no rubs or gallops Abdomen: no tenderness or distention, no masses by palpation, no abnormal pulsatility or arterial bruits, normal bowel sounds, no hepatosplenomegaly Extremities: no clubbing, cyanosis or edema; 2+ radial, ulnar and brachial pulses bilaterally; 2+ right femoral, posterior tibial and dorsalis pedis pulses; 2+ left femoral, posterior tibial and dorsalis pedis pulses; no subclavian or femoral bruits Neurological: grossly nonfocal Psych: Normal mood and affect  Wt Readings from Last 3 Encounters:  05/21/18 138 lb (62.6 kg)  05/15/17 151 lb 9.6 oz (68.8 kg)  10/15/16 149 lb (67.6 kg)      Studies/Labs Reviewed:   EKG:  EKG is ordered today.  It shows background atrial fibrillation in 100% ventricular pacing, QTC 510 ms  Recent Labs: 11/04/2015 Total cholesterol 147, triglycerides 87, HDL 85, LDL 45 Glucose 87, BUN 12, creatinine 0.95, normal LFTs, hemoglobin 11.8, platelets 120 3K, TSH 5.05  ASSESSMENT:    1. Pacemaker   2. Permanent atrial fibrillation ([place])   3. NSVT (nonsustained ventricular tachycardia) ([place])   4. History of mechanical aortic valve replacement   5. History of mitral valve replacement with mechanical valve   6. Long term (current) use of anticoagulants   7. Essential hypertension      PLAN:  In order of problems listed above:  1. Permanent atrial fibrillation: Extremely high embolic risk due to the presence of mechanical aortic and mitral valve prostheses.  Anticoagulation should only be  interrupted for important surgical procedures or critical bleeding complications.  Should be bridged with short acting agents such as enoxaparin or intravenous heparin as appropriate. 2. PPM: Remote downloads every 3 months and yearly office visits 3. NSVT: None seen on the most recent device download, frequently  occured in the past 4. Mechanical AVR: She has a moderate degree of patient-valve mismatch, but this has not been clinically important. Despite small prosthetic valve size area valve gradients are actually fairly low (peak 30, mean 18) and stable over time. The dimensionless obstructive index is low at 0.23, consistent with moderate to severe obstruction.  Endocarditis prophylaxis is indicated 5. Mechanical MVR: Normal by echo in February 2017.  Endocarditis prophylaxis is indicated. 6. Warfarin: Has had some relatively minor bleeding complications, but overall well-tolerated 7. HTN: Fair control.    Medication Adjustments/Labs and Tests Ordered: Current medicines are reviewed at length with the patient today.  Concerns regarding medicines are outlined above.  Medication changes, Labs and Tests ordered today are listed in the Patient Instructions below. Patient Instructions  Dr Sallyanne Kuster recommends that you continue on your current medications as directed. Please refer to the Current Medication list given to you today.  Remote monitoring is used to monitor your Pacemaker or ICD from home. This monitoring reduces the number of office visits required to check your device to one time per year. It allows [place] to keep an eye on the functioning of your device to ensure it is working properly. You are scheduled for a device check from home on Wednesday, July 24th, 2019. You may send your transmission at any time that day. If you have a wireless device, the transmission will be sent automatically. After your physician reviews your transmission, you will receive a notification with your next transmission  date.  To improve our patient care and to more adequately follow your device, [facility] has decided, as a practice, to start following each patient four times a year with your home monitor. This means that you may experience a remote appointment that is close to an in-office appointment with your physician. Your insurance will apply at the same rate as other remote monitoring transmissions.  Dr Sallyanne Kuster recommends that you schedule a follow-up appointment in 12 months with a pacemaker check. You will receive a reminder letter in the mail two months in advance. If you don't receive a letter, please call our office to schedule the follow-up appointment.  If you need a refill on your cardiac medications before your next appointment, please call your pharmacy.      Signed, Sanda Klein, MD  05/23/2018 11:50 AM    [address], [place], [place]  [address] Phone: [phone]; Fax: [phone]

## 2018-05-21 NOTE — Patient Instructions (Signed)
Description   Continue with 2 tablets daily except 1 tablet each Saturday.  Repeat INR in 6 weeks

## 2018-05-21 NOTE — Patient Instructions (Signed)
Dr Sallyanne Kuster recommends that you continue on your current medications as directed. Please refer to the Current Medication list given to you today.  Remote monitoring is used to monitor your Pacemaker or ICD from home. This monitoring reduces the number of office visits required to check your device to one time per year. It allows [place] to keep an eye on the functioning of your device to ensure it is working properly. You are scheduled for a device check from home on Wednesday, July 24th, 2019. You may send your transmission at any time that day. If you have a wireless device, the transmission will be sent automatically. After your physician reviews your transmission, you will receive a notification with your next transmission date.  To improve our patient care and to more adequately follow your device, [facility] has decided, as a practice, to start following each patient four times a year with your home monitor. This means that you may experience a remote appointment that is close to an in-office appointment with your physician. Your insurance will apply at the same rate as other remote monitoring transmissions.  Dr Sallyanne Kuster recommends that you schedule a follow-up appointment in 12 months with a pacemaker check. You will receive a reminder letter in the mail two months in advance. If you don't receive a letter, please call our office to schedule the follow-up appointment.  If you need a refill on your cardiac medications before your next appointment, please call your pharmacy.

## 2018-05-28 ENCOUNTER — Encounter: Payer: Medicare Other | Admitting: *Deleted

## 2018-05-28 ENCOUNTER — Telehealth: Payer: Self-pay | Admitting: Cardiology

## 2018-05-28 NOTE — Telephone Encounter (Signed)
LMOVM reminding pt to send remote transmission.   

## 2018-05-29 ENCOUNTER — Encounter: Payer: Self-pay | Admitting: Cardiology

## 2018-05-30 ENCOUNTER — Other Ambulatory Visit: Payer: Self-pay | Admitting: Cardiovascular Disease

## 2018-06-09 DIAGNOSIS — I10 Essential (primary) hypertension: Secondary | ICD-10-CM | POA: Diagnosis not present

## 2018-06-09 DIAGNOSIS — Z952 Presence of prosthetic heart valve: Secondary | ICD-10-CM | POA: Diagnosis not present

## 2018-06-09 DIAGNOSIS — K219 Gastro-esophageal reflux disease without esophagitis: Secondary | ICD-10-CM | POA: Diagnosis not present

## 2018-06-09 DIAGNOSIS — Z950 Presence of cardiac pacemaker: Secondary | ICD-10-CM | POA: Diagnosis not present

## 2018-06-09 DIAGNOSIS — E782 Mixed hyperlipidemia: Secondary | ICD-10-CM | POA: Diagnosis not present

## 2018-06-11 DIAGNOSIS — I10 Essential (primary) hypertension: Secondary | ICD-10-CM | POA: Diagnosis not present

## 2018-06-11 DIAGNOSIS — E782 Mixed hyperlipidemia: Secondary | ICD-10-CM | POA: Diagnosis not present

## 2018-06-11 DIAGNOSIS — E039 Hypothyroidism, unspecified: Secondary | ICD-10-CM | POA: Diagnosis not present

## 2018-06-11 DIAGNOSIS — D126 Benign neoplasm of colon, unspecified: Secondary | ICD-10-CM | POA: Diagnosis not present

## 2018-06-11 DIAGNOSIS — E559 Vitamin D deficiency, unspecified: Secondary | ICD-10-CM | POA: Diagnosis not present

## 2018-06-30 ENCOUNTER — Other Ambulatory Visit: Payer: Self-pay | Admitting: Cardiovascular Disease

## 2018-07-01 ENCOUNTER — Other Ambulatory Visit: Payer: Self-pay | Admitting: Cardiovascular Disease

## 2018-07-01 NOTE — Telephone Encounter (Signed)
Rx sent to pharmacy   

## 2018-07-02 ENCOUNTER — Ambulatory Visit (INDEPENDENT_AMBULATORY_CARE_PROVIDER_SITE_OTHER): Payer: Medicare Other | Admitting: Pharmacist Clinician (PhC)/ Clinical Pharmacy Specialist

## 2018-07-02 DIAGNOSIS — I4891 Unspecified atrial fibrillation: Secondary | ICD-10-CM | POA: Diagnosis not present

## 2018-07-02 DIAGNOSIS — Z7901 Long term (current) use of anticoagulants: Secondary | ICD-10-CM

## 2018-07-02 DIAGNOSIS — Z952 Presence of prosthetic heart valve: Secondary | ICD-10-CM

## 2018-07-02 LAB — POCT INR: INR: 1.2 — AB (ref 2.0–3.0)

## 2018-07-08 DIAGNOSIS — S90822A Blister (nonthermal), left foot, initial encounter: Secondary | ICD-10-CM | POA: Diagnosis not present

## 2018-07-22 ENCOUNTER — Ambulatory Visit (INDEPENDENT_AMBULATORY_CARE_PROVIDER_SITE_OTHER): Payer: Medicare Other | Admitting: Pharmacist

## 2018-07-22 DIAGNOSIS — Z952 Presence of prosthetic heart valve: Secondary | ICD-10-CM | POA: Diagnosis not present

## 2018-07-22 DIAGNOSIS — Z7901 Long term (current) use of anticoagulants: Secondary | ICD-10-CM | POA: Diagnosis not present

## 2018-07-22 DIAGNOSIS — I4891 Unspecified atrial fibrillation: Secondary | ICD-10-CM | POA: Diagnosis not present

## 2018-07-22 DIAGNOSIS — I4819 Other persistent atrial fibrillation: Secondary | ICD-10-CM

## 2018-07-22 DIAGNOSIS — I481 Persistent atrial fibrillation: Secondary | ICD-10-CM

## 2018-07-22 LAB — POCT INR: INR: 2.1 (ref 2.0–3.0)

## 2018-08-15 DIAGNOSIS — H401221 Low-tension glaucoma, left eye, mild stage: Secondary | ICD-10-CM | POA: Diagnosis not present

## 2018-08-20 DIAGNOSIS — Z23 Encounter for immunization: Secondary | ICD-10-CM | POA: Diagnosis not present

## 2018-08-21 ENCOUNTER — Ambulatory Visit (INDEPENDENT_AMBULATORY_CARE_PROVIDER_SITE_OTHER): Payer: Medicare Other | Admitting: Pharmacist

## 2018-08-21 DIAGNOSIS — I4891 Unspecified atrial fibrillation: Secondary | ICD-10-CM | POA: Diagnosis not present

## 2018-08-21 DIAGNOSIS — Z952 Presence of prosthetic heart valve: Secondary | ICD-10-CM

## 2018-08-21 DIAGNOSIS — Z7901 Long term (current) use of anticoagulants: Secondary | ICD-10-CM

## 2018-08-21 LAB — POCT INR: INR: 2.3 (ref 2.0–3.0)

## 2018-08-28 ENCOUNTER — Encounter: Payer: Self-pay | Admitting: Cardiology

## 2018-09-03 ENCOUNTER — Ambulatory Visit (INDEPENDENT_AMBULATORY_CARE_PROVIDER_SITE_OTHER): Payer: Medicare Other | Admitting: *Deleted

## 2018-09-03 ENCOUNTER — Encounter: Payer: Self-pay | Admitting: Cardiovascular Disease

## 2018-09-03 DIAGNOSIS — I4891 Unspecified atrial fibrillation: Secondary | ICD-10-CM

## 2018-09-03 DIAGNOSIS — K219 Gastro-esophageal reflux disease without esophagitis: Secondary | ICD-10-CM | POA: Diagnosis not present

## 2018-09-03 DIAGNOSIS — I10 Essential (primary) hypertension: Secondary | ICD-10-CM

## 2018-09-03 DIAGNOSIS — E039 Hypothyroidism, unspecified: Secondary | ICD-10-CM | POA: Diagnosis not present

## 2018-09-03 DIAGNOSIS — N183 Chronic kidney disease, stage 3 (moderate): Secondary | ICD-10-CM | POA: Diagnosis not present

## 2018-09-03 NOTE — Progress Notes (Signed)
Remote pacemaker transmission.   

## 2018-09-09 DIAGNOSIS — E559 Vitamin D deficiency, unspecified: Secondary | ICD-10-CM | POA: Diagnosis not present

## 2018-09-09 DIAGNOSIS — Z1389 Encounter for screening for other disorder: Secondary | ICD-10-CM | POA: Diagnosis not present

## 2018-09-09 DIAGNOSIS — D126 Benign neoplasm of colon, unspecified: Secondary | ICD-10-CM | POA: Diagnosis not present

## 2018-09-09 DIAGNOSIS — I10 Essential (primary) hypertension: Secondary | ICD-10-CM | POA: Diagnosis not present

## 2018-09-09 DIAGNOSIS — E782 Mixed hyperlipidemia: Secondary | ICD-10-CM | POA: Diagnosis not present

## 2018-09-12 ENCOUNTER — Encounter: Payer: Self-pay | Admitting: Cardiology

## 2018-09-19 ENCOUNTER — Ambulatory Visit (INDEPENDENT_AMBULATORY_CARE_PROVIDER_SITE_OTHER): Payer: Medicare Other | Admitting: Pharmacist

## 2018-09-19 DIAGNOSIS — Z952 Presence of prosthetic heart valve: Secondary | ICD-10-CM

## 2018-09-19 DIAGNOSIS — I4891 Unspecified atrial fibrillation: Secondary | ICD-10-CM | POA: Diagnosis not present

## 2018-09-19 DIAGNOSIS — Z7901 Long term (current) use of anticoagulants: Secondary | ICD-10-CM | POA: Diagnosis not present

## 2018-09-19 LAB — POCT INR: INR: 3 (ref 2.0–3.0)

## 2018-10-14 ENCOUNTER — Ambulatory Visit (INDEPENDENT_AMBULATORY_CARE_PROVIDER_SITE_OTHER): Payer: Medicare Other | Admitting: *Deleted

## 2018-10-14 DIAGNOSIS — I4891 Unspecified atrial fibrillation: Secondary | ICD-10-CM | POA: Diagnosis not present

## 2018-10-14 DIAGNOSIS — Z952 Presence of prosthetic heart valve: Secondary | ICD-10-CM

## 2018-10-14 DIAGNOSIS — Z7901 Long term (current) use of anticoagulants: Secondary | ICD-10-CM | POA: Diagnosis not present

## 2018-10-14 LAB — POCT INR: INR: 2.1 (ref 2.0–3.0)

## 2018-10-14 NOTE — Patient Instructions (Signed)
Continue coumadin 2 tablets daily except 1 tablet each Saturday.  Repeat INR in 4 weeks

## 2018-10-30 DIAGNOSIS — N183 Chronic kidney disease, stage 3 (moderate): Secondary | ICD-10-CM | POA: Diagnosis not present

## 2018-10-30 DIAGNOSIS — J019 Acute sinusitis, unspecified: Secondary | ICD-10-CM | POA: Diagnosis not present

## 2018-10-30 DIAGNOSIS — Z952 Presence of prosthetic heart valve: Secondary | ICD-10-CM | POA: Diagnosis not present

## 2018-10-30 DIAGNOSIS — I10 Essential (primary) hypertension: Secondary | ICD-10-CM | POA: Diagnosis not present

## 2018-11-02 LAB — CUP PACEART REMOTE DEVICE CHECK
Battery Voltage: 2.78 V
Brady Statistic RV Percent Paced: 100 %
Implantable Lead Implant Date: 20110701
Implantable Lead Location: 753860
Implantable Lead Model: 5076
Implantable Pulse Generator Implant Date: 20110701
Lead Channel Impedance Value: 516 Ohm
Lead Channel Impedance Value: 67 Ohm
Lead Channel Pacing Threshold Amplitude: 0.625 V
Lead Channel Setting Pacing Amplitude: 2.5 V
Lead Channel Setting Pacing Pulse Width: 0.4 ms
MDC IDC LEAD IMPLANT DT: 20110701
MDC IDC LEAD LOCATION: 753859
MDC IDC MSMT BATTERY IMPEDANCE: 879 Ohm
MDC IDC MSMT BATTERY REMAINING LONGEVITY: 68 mo
MDC IDC MSMT LEADCHNL RV PACING THRESHOLD PULSEWIDTH: 0.4 ms
MDC IDC SESS DTM: 20191030160448
MDC IDC SET LEADCHNL RV SENSING SENSITIVITY: 2.8 mV

## 2018-11-11 ENCOUNTER — Ambulatory Visit (INDEPENDENT_AMBULATORY_CARE_PROVIDER_SITE_OTHER): Payer: Medicare Other | Admitting: Pharmacist

## 2018-11-11 DIAGNOSIS — I4891 Unspecified atrial fibrillation: Secondary | ICD-10-CM

## 2018-11-11 DIAGNOSIS — Z7901 Long term (current) use of anticoagulants: Secondary | ICD-10-CM | POA: Diagnosis not present

## 2018-11-11 DIAGNOSIS — Z952 Presence of prosthetic heart valve: Secondary | ICD-10-CM

## 2018-11-11 LAB — POCT INR: INR: 2.5 (ref 2.0–3.0)

## 2018-11-11 NOTE — Patient Instructions (Signed)
Description   Continue coumadin 2 tablets daily except 1 tablet each Saturday.  Repeat INR in 4 weeks

## 2018-12-01 ENCOUNTER — Other Ambulatory Visit: Payer: Self-pay | Admitting: Cardiovascular Disease

## 2018-12-01 NOTE — Telephone Encounter (Signed)
Please review for refill. Thanks!  

## 2018-12-09 ENCOUNTER — Ambulatory Visit (INDEPENDENT_AMBULATORY_CARE_PROVIDER_SITE_OTHER): Payer: Medicare Other | Admitting: Pharmacist

## 2018-12-09 DIAGNOSIS — I4891 Unspecified atrial fibrillation: Secondary | ICD-10-CM

## 2018-12-09 DIAGNOSIS — K219 Gastro-esophageal reflux disease without esophagitis: Secondary | ICD-10-CM | POA: Diagnosis not present

## 2018-12-09 DIAGNOSIS — Z7901 Long term (current) use of anticoagulants: Secondary | ICD-10-CM | POA: Diagnosis not present

## 2018-12-09 DIAGNOSIS — I10 Essential (primary) hypertension: Secondary | ICD-10-CM | POA: Diagnosis not present

## 2018-12-09 DIAGNOSIS — D519 Vitamin B12 deficiency anemia, unspecified: Secondary | ICD-10-CM | POA: Diagnosis not present

## 2018-12-09 DIAGNOSIS — Z952 Presence of prosthetic heart valve: Secondary | ICD-10-CM

## 2018-12-09 DIAGNOSIS — E559 Vitamin D deficiency, unspecified: Secondary | ICD-10-CM | POA: Diagnosis not present

## 2018-12-09 DIAGNOSIS — E039 Hypothyroidism, unspecified: Secondary | ICD-10-CM | POA: Diagnosis not present

## 2018-12-09 LAB — POCT INR: INR: 2.3 (ref 2.0–3.0)

## 2018-12-09 NOTE — Patient Instructions (Signed)
Description   Continue coumadin 2 tablets daily except 1 tablet each Saturday.  Repeat INR in 5 weeks

## 2018-12-12 DIAGNOSIS — I10 Essential (primary) hypertension: Secondary | ICD-10-CM | POA: Diagnosis not present

## 2018-12-12 DIAGNOSIS — E782 Mixed hyperlipidemia: Secondary | ICD-10-CM | POA: Diagnosis not present

## 2018-12-12 DIAGNOSIS — I700 Atherosclerosis of aorta: Secondary | ICD-10-CM | POA: Diagnosis not present

## 2018-12-12 DIAGNOSIS — D126 Benign neoplasm of colon, unspecified: Secondary | ICD-10-CM | POA: Diagnosis not present

## 2018-12-15 ENCOUNTER — Encounter: Payer: Self-pay | Admitting: Cardiology

## 2018-12-19 ENCOUNTER — Ambulatory Visit (INDEPENDENT_AMBULATORY_CARE_PROVIDER_SITE_OTHER): Payer: Medicare Other

## 2018-12-19 DIAGNOSIS — I4891 Unspecified atrial fibrillation: Secondary | ICD-10-CM

## 2018-12-21 LAB — CUP PACEART REMOTE DEVICE CHECK
Implantable Lead Implant Date: 20110701
Implantable Lead Model: 5076
Implantable Lead Model: 5076
Implantable Pulse Generator Implant Date: 20110701
Lead Channel Impedance Value: 488 Ohm
Lead Channel Impedance Value: 67 Ohm
Lead Channel Pacing Threshold Amplitude: 0.625 V
Lead Channel Pacing Threshold Pulse Width: 0.4 ms
Lead Channel Setting Pacing Amplitude: 2.5 V
Lead Channel Setting Pacing Pulse Width: 0.4 ms
MDC IDC LEAD IMPLANT DT: 20110701
MDC IDC LEAD LOCATION: 753859
MDC IDC LEAD LOCATION: 753860
MDC IDC MSMT BATTERY IMPEDANCE: 1061 Ohm
MDC IDC MSMT BATTERY REMAINING LONGEVITY: 59 mo
MDC IDC MSMT BATTERY VOLTAGE: 2.78 V
MDC IDC SESS DTM: 20200214212507
MDC IDC SET LEADCHNL RV SENSING SENSITIVITY: 4 mV
MDC IDC STAT BRADY RV PERCENT PACED: 100 %

## 2018-12-30 NOTE — Progress Notes (Signed)
Remote pacemaker transmission.   

## 2019-01-02 ENCOUNTER — Other Ambulatory Visit: Payer: Self-pay | Admitting: Cardiovascular Disease

## 2019-01-13 ENCOUNTER — Ambulatory Visit (INDEPENDENT_AMBULATORY_CARE_PROVIDER_SITE_OTHER): Payer: Medicare Other | Admitting: *Deleted

## 2019-01-13 DIAGNOSIS — Z952 Presence of prosthetic heart valve: Secondary | ICD-10-CM

## 2019-01-13 DIAGNOSIS — Z5181 Encounter for therapeutic drug level monitoring: Secondary | ICD-10-CM | POA: Diagnosis not present

## 2019-01-13 DIAGNOSIS — I4891 Unspecified atrial fibrillation: Secondary | ICD-10-CM | POA: Diagnosis not present

## 2019-01-13 DIAGNOSIS — Z7901 Long term (current) use of anticoagulants: Secondary | ICD-10-CM | POA: Diagnosis not present

## 2019-01-13 LAB — POCT INR: INR: 1.2 — AB (ref 2.0–3.0)

## 2019-01-13 NOTE — Patient Instructions (Signed)
Take coumadin 3 tablets today and tomorrow then continue 2 tablets daily except 1 tablet each Saturday.  Repeat INR in 1 week

## 2019-01-20 ENCOUNTER — Ambulatory Visit (INDEPENDENT_AMBULATORY_CARE_PROVIDER_SITE_OTHER): Payer: Medicare Other | Admitting: *Deleted

## 2019-01-20 ENCOUNTER — Other Ambulatory Visit: Payer: Self-pay

## 2019-01-20 DIAGNOSIS — Z952 Presence of prosthetic heart valve: Secondary | ICD-10-CM | POA: Diagnosis not present

## 2019-01-20 DIAGNOSIS — Z5181 Encounter for therapeutic drug level monitoring: Secondary | ICD-10-CM

## 2019-01-20 DIAGNOSIS — Z7901 Long term (current) use of anticoagulants: Secondary | ICD-10-CM

## 2019-01-20 DIAGNOSIS — I4821 Permanent atrial fibrillation: Secondary | ICD-10-CM

## 2019-01-20 DIAGNOSIS — I4891 Unspecified atrial fibrillation: Secondary | ICD-10-CM | POA: Diagnosis not present

## 2019-01-20 LAB — POCT INR: INR: 1.2 — AB (ref 2.0–3.0)

## 2019-01-20 NOTE — Patient Instructions (Signed)
Start taking coumadin 3 tablets alternating with 2 tablets every other day Recheck in 1 week

## 2019-01-27 ENCOUNTER — Ambulatory Visit (INDEPENDENT_AMBULATORY_CARE_PROVIDER_SITE_OTHER): Payer: Medicare Other | Admitting: *Deleted

## 2019-01-27 DIAGNOSIS — Z952 Presence of prosthetic heart valve: Secondary | ICD-10-CM

## 2019-01-27 DIAGNOSIS — I4891 Unspecified atrial fibrillation: Secondary | ICD-10-CM | POA: Diagnosis not present

## 2019-01-27 DIAGNOSIS — Z7901 Long term (current) use of anticoagulants: Secondary | ICD-10-CM | POA: Diagnosis not present

## 2019-01-27 DIAGNOSIS — Z5181 Encounter for therapeutic drug level monitoring: Secondary | ICD-10-CM | POA: Diagnosis not present

## 2019-01-27 LAB — POCT INR: INR: 2.7 (ref 2.0–3.0)

## 2019-01-27 NOTE — Patient Instructions (Signed)
Take 2 tablets daily except 3 tablets on Tuesdays, Thursdays and Saturdays Recheck in 2 weeks

## 2019-02-01 ENCOUNTER — Other Ambulatory Visit: Payer: Self-pay | Admitting: Cardiovascular Disease

## 2019-02-10 ENCOUNTER — Telehealth: Payer: Self-pay | Admitting: Cardiovascular Disease

## 2019-02-10 NOTE — Telephone Encounter (Signed)
° ° ° °  COVID-19 Pre-Screening Questions: ° °• Do you currently have a fever? No °•  °• Have you recently travelled on a cruise, internationally, or to [place], [place], [place], [place], [place], or [place], [place] ([employer]) ? No °•  °• Have you been in contact with someone that is currently pending confirmation of Covid19 testing or has been confirmed to have the Covid19 virus? No °•  °• Are you currently experiencing fatigue or cough? No  ° ° °   ° ° ° ° ° °

## 2019-02-11 ENCOUNTER — Ambulatory Visit (INDEPENDENT_AMBULATORY_CARE_PROVIDER_SITE_OTHER): Payer: Medicare Other | Admitting: *Deleted

## 2019-02-11 DIAGNOSIS — Z7901 Long term (current) use of anticoagulants: Secondary | ICD-10-CM | POA: Diagnosis not present

## 2019-02-11 DIAGNOSIS — Z952 Presence of prosthetic heart valve: Secondary | ICD-10-CM | POA: Diagnosis not present

## 2019-02-11 DIAGNOSIS — Z5181 Encounter for therapeutic drug level monitoring: Secondary | ICD-10-CM | POA: Diagnosis not present

## 2019-02-11 DIAGNOSIS — I4891 Unspecified atrial fibrillation: Secondary | ICD-10-CM

## 2019-02-11 LAB — POCT INR: INR: 3.8 — AB (ref 2.0–3.0)

## 2019-02-11 NOTE — Patient Instructions (Signed)
Hold coumadin tonight then decrease dose to 2 tablets daily except 3 tablets on Tuesdays and Saturdays Recheck in 3 weeks

## 2019-03-03 ENCOUNTER — Telehealth: Payer: Self-pay | Admitting: *Deleted

## 2019-03-03 NOTE — Telephone Encounter (Signed)

## 2019-03-04 ENCOUNTER — Ambulatory Visit (INDEPENDENT_AMBULATORY_CARE_PROVIDER_SITE_OTHER): Payer: Medicare Other | Admitting: Pharmacist Clinician (PhC)/ Clinical Pharmacy Specialist

## 2019-03-04 DIAGNOSIS — K219 Gastro-esophageal reflux disease without esophagitis: Secondary | ICD-10-CM | POA: Diagnosis not present

## 2019-03-04 DIAGNOSIS — E782 Mixed hyperlipidemia: Secondary | ICD-10-CM | POA: Diagnosis not present

## 2019-03-04 DIAGNOSIS — I4891 Unspecified atrial fibrillation: Secondary | ICD-10-CM

## 2019-03-04 DIAGNOSIS — Z7901 Long term (current) use of anticoagulants: Secondary | ICD-10-CM | POA: Diagnosis not present

## 2019-03-04 DIAGNOSIS — E039 Hypothyroidism, unspecified: Secondary | ICD-10-CM | POA: Diagnosis not present

## 2019-03-04 DIAGNOSIS — Z952 Presence of prosthetic heart valve: Secondary | ICD-10-CM

## 2019-03-04 DIAGNOSIS — I10 Essential (primary) hypertension: Secondary | ICD-10-CM | POA: Diagnosis not present

## 2019-03-04 DIAGNOSIS — R945 Abnormal results of liver function studies: Secondary | ICD-10-CM | POA: Diagnosis not present

## 2019-03-04 LAB — POCT INR: INR: 2.7 (ref 2.0–3.0)

## 2019-03-11 DIAGNOSIS — I10 Essential (primary) hypertension: Secondary | ICD-10-CM | POA: Diagnosis not present

## 2019-03-11 DIAGNOSIS — E782 Mixed hyperlipidemia: Secondary | ICD-10-CM | POA: Diagnosis not present

## 2019-03-11 DIAGNOSIS — N183 Chronic kidney disease, stage 3 (moderate): Secondary | ICD-10-CM | POA: Diagnosis not present

## 2019-03-11 DIAGNOSIS — I700 Atherosclerosis of aorta: Secondary | ICD-10-CM | POA: Diagnosis not present

## 2019-03-20 ENCOUNTER — Other Ambulatory Visit: Payer: Self-pay

## 2019-03-20 ENCOUNTER — Telehealth: Payer: Self-pay

## 2019-03-20 ENCOUNTER — Ambulatory Visit (INDEPENDENT_AMBULATORY_CARE_PROVIDER_SITE_OTHER): Payer: Medicare Other | Admitting: *Deleted

## 2019-03-20 DIAGNOSIS — I4891 Unspecified atrial fibrillation: Secondary | ICD-10-CM | POA: Diagnosis not present

## 2019-03-20 NOTE — Telephone Encounter (Signed)
Spoke with patient to remind of missed remote transmission 

## 2019-03-21 LAB — CUP PACEART REMOTE DEVICE CHECK
Battery Impedance: 1089 Ohm
Battery Remaining Longevity: 59 mo
Battery Voltage: 2.77 V
Brady Statistic RV Percent Paced: 100 %
Date Time Interrogation Session: 20200515160913
Implantable Lead Implant Date: 20110701
Implantable Lead Implant Date: 20110701
Implantable Lead Location: 753859
Implantable Lead Location: 753860
Implantable Lead Model: 5076
Implantable Lead Model: 5076
Implantable Pulse Generator Implant Date: 20110701
Lead Channel Impedance Value: 515 Ohm
Lead Channel Impedance Value: 67 Ohm
Lead Channel Pacing Threshold Amplitude: 0.625 V
Lead Channel Pacing Threshold Pulse Width: 0.4 ms
Lead Channel Setting Pacing Amplitude: 2.5 V
Lead Channel Setting Pacing Pulse Width: 0.4 ms
Lead Channel Setting Sensing Sensitivity: 2.8 mV

## 2019-03-23 ENCOUNTER — Encounter: Payer: Self-pay | Admitting: Cardiology

## 2019-03-23 NOTE — Progress Notes (Signed)
Remote pacemaker transmission.   

## 2019-03-25 ENCOUNTER — Other Ambulatory Visit: Payer: Self-pay

## 2019-03-25 ENCOUNTER — Ambulatory Visit (INDEPENDENT_AMBULATORY_CARE_PROVIDER_SITE_OTHER): Payer: Medicare Other | Admitting: *Deleted

## 2019-03-25 DIAGNOSIS — Z5181 Encounter for therapeutic drug level monitoring: Secondary | ICD-10-CM

## 2019-03-25 DIAGNOSIS — I4891 Unspecified atrial fibrillation: Secondary | ICD-10-CM

## 2019-03-25 DIAGNOSIS — Z7901 Long term (current) use of anticoagulants: Secondary | ICD-10-CM

## 2019-03-25 DIAGNOSIS — Z952 Presence of prosthetic heart valve: Secondary | ICD-10-CM | POA: Diagnosis not present

## 2019-03-25 LAB — POCT INR: INR: 3.8 — AB (ref 2.0–3.0)

## 2019-03-25 NOTE — Patient Instructions (Signed)
Hold coumadin tonight then decrease dose to 2 tablets daily except 3 tablets on Tuesdays Recheck in 3 weeks

## 2019-04-20 ENCOUNTER — Ambulatory Visit (INDEPENDENT_AMBULATORY_CARE_PROVIDER_SITE_OTHER): Payer: Medicare Other | Admitting: *Deleted

## 2019-04-20 DIAGNOSIS — Z952 Presence of prosthetic heart valve: Secondary | ICD-10-CM

## 2019-04-20 DIAGNOSIS — Z7901 Long term (current) use of anticoagulants: Secondary | ICD-10-CM

## 2019-04-20 DIAGNOSIS — I4891 Unspecified atrial fibrillation: Secondary | ICD-10-CM

## 2019-04-20 DIAGNOSIS — I480 Paroxysmal atrial fibrillation: Secondary | ICD-10-CM

## 2019-04-20 LAB — POCT INR: INR: 3.1 — AB (ref 2.0–3.0)

## 2019-04-20 NOTE — Patient Instructions (Signed)
Took coumadin today. Take 1 tablet tomorrow then decrease dose to 2 tablets daily  Recheck in 3 weeks

## 2019-05-11 ENCOUNTER — Other Ambulatory Visit: Payer: Self-pay

## 2019-05-11 ENCOUNTER — Ambulatory Visit (INDEPENDENT_AMBULATORY_CARE_PROVIDER_SITE_OTHER): Payer: Medicare Other | Admitting: *Deleted

## 2019-05-11 DIAGNOSIS — I4891 Unspecified atrial fibrillation: Secondary | ICD-10-CM | POA: Diagnosis not present

## 2019-05-11 DIAGNOSIS — Z7901 Long term (current) use of anticoagulants: Secondary | ICD-10-CM

## 2019-05-11 DIAGNOSIS — Z952 Presence of prosthetic heart valve: Secondary | ICD-10-CM | POA: Diagnosis not present

## 2019-05-11 DIAGNOSIS — Z5181 Encounter for therapeutic drug level monitoring: Secondary | ICD-10-CM | POA: Diagnosis not present

## 2019-05-11 DIAGNOSIS — I482 Chronic atrial fibrillation, unspecified: Secondary | ICD-10-CM | POA: Diagnosis not present

## 2019-05-11 LAB — POCT INR: INR: 2.2 (ref 2.0–3.0)

## 2019-05-11 NOTE — Patient Instructions (Signed)
Took coumadin today. Continue coumadin 2 tablets daily  Recheck in 3 weeks

## 2019-05-29 DIAGNOSIS — E782 Mixed hyperlipidemia: Secondary | ICD-10-CM | POA: Diagnosis not present

## 2019-05-29 DIAGNOSIS — I10 Essential (primary) hypertension: Secondary | ICD-10-CM | POA: Diagnosis not present

## 2019-05-29 DIAGNOSIS — R945 Abnormal results of liver function studies: Secondary | ICD-10-CM | POA: Diagnosis not present

## 2019-05-29 DIAGNOSIS — K219 Gastro-esophageal reflux disease without esophagitis: Secondary | ICD-10-CM | POA: Diagnosis not present

## 2019-06-01 ENCOUNTER — Ambulatory Visit (INDEPENDENT_AMBULATORY_CARE_PROVIDER_SITE_OTHER): Payer: Medicare Other | Admitting: *Deleted

## 2019-06-01 ENCOUNTER — Other Ambulatory Visit: Payer: Self-pay | Admitting: Cardiovascular Disease

## 2019-06-01 DIAGNOSIS — Z5181 Encounter for therapeutic drug level monitoring: Secondary | ICD-10-CM

## 2019-06-01 DIAGNOSIS — I4891 Unspecified atrial fibrillation: Secondary | ICD-10-CM | POA: Diagnosis not present

## 2019-06-01 DIAGNOSIS — Z952 Presence of prosthetic heart valve: Secondary | ICD-10-CM

## 2019-06-01 DIAGNOSIS — Z7901 Long term (current) use of anticoagulants: Secondary | ICD-10-CM | POA: Diagnosis not present

## 2019-06-01 LAB — POCT INR: INR: 1.6 — AB (ref 2.0–3.0)

## 2019-06-01 NOTE — Patient Instructions (Signed)
Take coumadin 3 tablets tonight and tomorrow night then resume 2 tablets daily  Recheck in 4 weeks

## 2019-06-01 NOTE — Telephone Encounter (Signed)
Refill Request.  

## 2019-06-04 ENCOUNTER — Telehealth: Payer: Self-pay | Admitting: *Deleted

## 2019-06-04 NOTE — Telephone Encounter (Signed)
The patient has confirmed her appointment for 8/3 with Dr. Sallyanne Kuster. She has been advised to wear a mask. She stated that her sister and husband want to come. She has been advised that due to limited seating and reducing the risk for exposure that they will need to wait in the car. She has verbalized her understanding.      COVID-19 Pre-Screening Questions:  . In the past 7 to 10 days have you had a cough,  shortness of breath, headache, congestion, fever (100 or greater) body aches, chills, sore throat, or sudden loss of taste or sense of smell? No . Have you been around anyone with known Covid 19. No . Have you been around anyone who is awaiting Covid 19 test results in the past 7 to 10 days? No . Have you been around anyone who has been exposed to Covid 19, or has mentioned symptoms of Covid 19 within the past 7 to 10 days? No  If you have any concerns/questions about symptoms patients report during screening (either on the phone or at threshold). Contact the provider seeing the patient or DOD for further guidance.  If neither are available contact a member of the leadership team.

## 2019-06-08 ENCOUNTER — Encounter: Payer: Self-pay | Admitting: Cardiovascular Disease

## 2019-06-08 ENCOUNTER — Ambulatory Visit (INDEPENDENT_AMBULATORY_CARE_PROVIDER_SITE_OTHER): Payer: Medicare Other | Admitting: Cardiovascular Disease

## 2019-06-08 ENCOUNTER — Ambulatory Visit (INDEPENDENT_AMBULATORY_CARE_PROVIDER_SITE_OTHER): Payer: Medicare Other | Admitting: Pharmacist

## 2019-06-08 ENCOUNTER — Other Ambulatory Visit: Payer: Self-pay

## 2019-06-08 VITALS — BP 138/92 | HR 75 | Temp 97.2°F | Ht 61.0 in | Wt 141.2 lb

## 2019-06-08 DIAGNOSIS — Z7901 Long term (current) use of anticoagulants: Secondary | ICD-10-CM

## 2019-06-08 DIAGNOSIS — I472 Ventricular tachycardia: Secondary | ICD-10-CM

## 2019-06-08 DIAGNOSIS — Z952 Presence of prosthetic heart valve: Secondary | ICD-10-CM

## 2019-06-08 DIAGNOSIS — Z950 Presence of cardiac pacemaker: Secondary | ICD-10-CM

## 2019-06-08 DIAGNOSIS — I4891 Unspecified atrial fibrillation: Secondary | ICD-10-CM

## 2019-06-08 DIAGNOSIS — I4821 Permanent atrial fibrillation: Secondary | ICD-10-CM | POA: Diagnosis not present

## 2019-06-08 DIAGNOSIS — I4729 Other ventricular tachycardia: Secondary | ICD-10-CM

## 2019-06-08 DIAGNOSIS — I10 Essential (primary) hypertension: Secondary | ICD-10-CM

## 2019-06-08 LAB — POCT INR: INR: 2.2 (ref 2.0–3.0)

## 2019-06-08 NOTE — Patient Instructions (Signed)
Continue taking 2 tablets daily except for 3 tablets every Tuesday.  Recheck in 3 weeks

## 2019-06-08 NOTE — Progress Notes (Signed)
Patient ID: Alice Ballard, female   DOB: [birth year], 78 y.o.   MRN: [id number]    Cardiology Office Note    Date:  06/08/2019   ID:  Alice Ballard, DOB 1941-05-01, MRN [id number]  PCP:  Curlene Labrum, MD  Cardiologist:   Sanda Klein, MD   No chief complaint on file.   History of Present Illness:  Alice Ballard is a 78 y.o. 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   The patient specifically denies any chest pain at rest exertion, dyspnea at rest or with exertion, orthopnea, paroxysmal nocturnal dyspnea, syncope, palpitations, focal neurological deficits, intermittent claudication, lower extremity edema, unexplained weight gain, cough, hemoptysis or wheezing.  Denies recent bleeding, falls/injuries.  INR was subtherapeutic at 1.6 on July 27 and her warfarin dose was adjusted.  She is here to be followed in the [facility] Coumadin clinic.  Pacemaker check today showed 99.7 % ventricular pacing and normal device function. Estimated device longevity 4.5 years (Medtronic Adapta implanted 2011, dual-chamber device now programmed VVIR).  Very rare high ventricular rates are seen.  She has underlying slow AV conduction with a ventricular rate in the low 50s and is not pacemaker dependent.  Alice Ballard is a 78 year old woman with a mechanical aortic and mitral valve prostheses as well as long-term persistent atrial fibrillation, hypertension, dyslipidemia. In 1986 her mitral valve was replaced with a 29 mm St. Jude prosthesis for rheumatic disease. In 1994 her aortic valve was replaced with a 19 mm St. Jude device. Has chronic moderate elevation in the valvular gradients across the aortic valve, likely an expression of patient-valve mismatch. Fluoroscopy has shown normal disc motion. Previous right heart catheterization had shown  only very mild pulmonary hypertension (mean PA pressure 25 mm Hg, wedge pressure 16 mm Hg) She has no history of coronary artery disease. She has a dual-chamber permanent pacemaker (Medtronic) that was implanted in 2011 for tachycardia-bradycardia syndrome, now programmed VVIR, virtually 100% V paced. She is on chronic warfarin anticoagulation both for her mechanical valves and for atrial fibrillation. She has not had serious bleeding complications or embolic events. She does not have a history of stroke.  Past Medical History:  Diagnosis Date  . Abnormality of gait 07/04/2016  . Cerebral vascular disease   . Coronary artery disease   . Excessive daytime sleepiness 07/04/2016  . Fatty liver   . Gait abnormality   . Gastric polyp   . GERD (gastroesophageal reflux disease)   . Hemorrhoids   . Hiatal hernia   . Hyperlipidemia   . Hypersomnolence   . Hypertension   . Pacemaker july 2011   medtronic adapta model # ADDRL 1,SERIAL [id number]  . Paroxysmal atrial fibrillation (HCC)   . Seizure disorder ([place])   . Sick sinus syndrome ([place])   . Spinal stenosis   . Tubular adenoma of colon 11/2010  . UTI (urinary tract infection)     Past Surgical History:  Procedure Laterality Date  . AORTIC AND MITRAL VALVE REPLACEMENT  1994   33mm St. Jude  . BACK SURGERY    . CARDIAC CATHETERIZATION  04/2010  . DOPPLER ECHOCARDIOGRAPHY  sept 06, 2013   aotric valve grad. peak 51 mmHg and mean 28 mm Hg  dimensional subtractive index 0.26; mitral valve grad peak 25 and mean 57mm Hg norm pressure halftime 20msec.  [name] MITRAL VALVE REPLACEMENT  1986   St Jude prosthesis   . NM MYOCAR PERF WALL MOTION  03/31/2008   EF 69% low risk  . NM MYOCAR PERF WALL MOTION  04/19/2006   EF 69%  . PACEMAKER PLACEMENT  05/2010  . TEE WITHOUT CARDIOVERSION  07/11/2012   Procedure: TRANSESOPHAGEAL ECHOCARDIOGRAM (TEE);  Surgeon: Sanda Klein, MD;  Location: [facility] ENDOSCOPY;  Service: Cardiovascular;  Laterality: N/A;    Outpatient  Medications Prior to Visit  Medication Sig Dispense Refill  . acetaminophen (TYLENOL) 500 MG tablet Take 1,000 mg by mouth every 6 (six) hours as needed. For pain    . alendronate (FOSAMAX) 70 MG tablet Take 70 mg by mouth every 7 (seven) days. Take with a full glass of water on an empty stomach. Takes on Sunday.    . Ascorbic Acid (VITAMIN C) 1000 MG tablet Take 1,000 mg by mouth daily.    [name] atorvastatin (LIPITOR) 80 MG tablet TAKE 1/2 TABLET BY MOUTH AT BEDTIME 45 tablet 3  . chlorthalidone (HYGROTON) 25 MG tablet Take 0.5 tablets (12.5 mg total) by mouth daily. 45 tablet 3  . Cholecalciferol (VITAMIN D3) 2000 UNITS capsule Take 2,000 Units by mouth daily.    [name] enoxaparin (LOVENOX) 60 MG/0.6ML injection Inject 60 mg (1 syringe) subcutaneously every 12 hours as directed 20 Syringe 0  . fluticasone (FLONASE) 50 MCG/ACT nasal spray Place 2 sprays into both nostrils 2 (two) times daily as needed.     . levETIRAcetam (KEPPRA) 500 MG tablet Take 500 mg by mouth 2 (two) times daily.    [name] lisinopril (PRINIVIL,ZESTRIL) 30 MG tablet TAKE 1 TABLET BY MOUTH EVERY DAY 90 tablet 1  . omeprazole (PRILOSEC) 20 MG capsule Take 20 mg by mouth daily as needed.     . pantoprazole (PROTONIX) 40 MG tablet Take 40 mg by mouth daily as needed. For heartburn    . Polyethyl Glycol-Propyl Glycol (SYSTANE OP) Place 1 drop into both eyes daily as needed. For dry eyes    . vitamin B-12 (CYANOCOBALAMIN) 1000 MCG tablet Take 1,000 mcg by mouth daily.    [name] warfarin (COUMADIN) 2.5 MG tablet TAKE 1 TO 2 TABLETS BY MOUTH DAILY AS DIRECTED by coumadin clinic 60 tablet 3  . folic acid (FOLVITE) 1 MG tablet TAKE ONE TABLET BY MOUTH EVERY DAY 90 tablet 0   No facility-administered medications prior to visit.      Allergies:   Shellfish allergy and Tegretol [carbamazepine]   Social History   Socioeconomic History  . Marital status: Married    Spouse name: Not on file  . Number of children: 1  . Years of education: Not on  file  . Highest education level: Not on file  Occupational History  . Occupation: DISABLED  Social Needs  . Financial resource strain: Not on file  . Food insecurity    Worry: Not on file    Inability: Not on file  . Transportation needs    Medical: Not on file    Non-medical: Not on file  Tobacco Use  . Smoking status: Never Smoker  . Smokeless tobacco: Never Used  Substance and Sexual Activity  . Alcohol use: No  . Drug use: No  . Sexual activity: Not on file  Lifestyle  . Physical activity    Days per week: Not on file    Minutes per session: Not on file  . Stress: Not on file  Relationships  . Social connections    Talks on phone: Not on  file    Gets together: Not on file    Attends religious service: Not on file    Active member of club or organization: Not on file    Attends meetings of clubs or organizations: Not on file    Relationship status: Not on file  Other Topics Concern  . Not on file  Social History Narrative   Lives at home with her husband.   Right-handed.   16-20oz of caffeine per day.     Family History:  The patient's family history includes Colon cancer in her sister; Colon polyps in her sister; Heart disease in her mother.   ROS:   Please see the history of present illness.    ROS All other systems reviewed and are negative   PHYSICAL EXAM:   VS:  BP (!) 138/92   Pulse 75   Temp (!) 97.2 F (36.2 C)   Ht 5\' 1"  (1.549 m)   Wt 141 lb 3.2 oz (64 kg)   SpO2 97%   BMI 26.68 kg/m      General: Alert, oriented x3, no distress, healthy left subclavian pacemaker site Head: no evidence of trauma, PERRL, EOMI, no exophtalmos or lid lag, no myxedema, no xanthelasma; normal ears, nose and oropharynx Neck: normal jugular venous pulsations and no hepatojugular reflux; brisk carotid pulses without delay and no carotid bruits Chest: clear to auscultation, no signs of consolidation by percussion or palpation, normal fremitus, symmetrical and full  respiratory excursions Cardiovascular: normal position and quality of the apical impulse, regular rhythm, crisp mechanical valve clicks and paradoxically split second heart sounds, no murmurs, rubs or gallops Abdomen: no tenderness or distention, no masses by palpation, no abnormal pulsatility or arterial bruits, normal bowel sounds, no hepatosplenomegaly Extremities: no clubbing, cyanosis or edema; 2+ radial, ulnar and brachial pulses bilaterally; 2+ right femoral, posterior tibial and dorsalis pedis pulses; 2+ left femoral, posterior tibial and dorsalis pedis pulses; no subclavian or femoral bruits Neurological: grossly nonfocal Psych: Normal mood and affect    Wt Readings from Last 3 Encounters:  06/08/19 141 lb 3.2 oz (64 kg)  05/21/18 138 lb (62.6 kg)  05/15/17 151 lb 9.6 oz (68.8 kg)      Studies/Labs Reviewed:   EKG:  EKG is ordered today.  It shows background atrial fibrillation in 100% ventricular pacing, QTC 510 ms  Recent Labs: 11/04/2015 Total cholesterol 147, triglycerides 87, HDL 85, LDL 45 Glucose 87, BUN 12, creatinine 0.95, normal LFTs, hemoglobin 11.8, platelets 120 3K, TSH 5.05  ASSESSMENT:    1. Permanent atrial fibrillation   2. Pacemaker: Medtronic 2011 for tachy brady AFIB   3. NSVT (nonsustained ventricular tachycardia) ([place])   4. Hx of mechanical aortic valve replacement   5. History of mitral valve replacement, mechanical   6. Long term current use of anticoagulant therapy   7. Essential hypertension      PLAN:  In order of problems listed above:  1. Permanent atrial fibrillation: Slow ventricular response so she has virtually 100% pacing.  Extremely high embolic risk due to the presence of mechanical aortic and mitral valve prostheses.  Anticoagulation should only be interrupted for important surgical procedures or critical bleeding complications.  Should be bridged with short acting agents such as enoxaparin or intravenous heparin as appropriate.  2. PPM: She is not pacemaker dependent.  Remote downloads every 3 months and yearly office visits 3. NSVT: Several high ventricular rate episodes seen in the last 12 months.  These are never symptomatic  4. Mechanical AVR: The gradients across the aortic valve are slightly elevated, consistent with  a moderate degree of patient-valve mismatch.  She does not have any symptoms of aortic stenosis.  Despite small prosthetic valve size area valve gradients are actually fairly low (peak 30, mean 18) and stable over time. The dimensionless obstructive index is low at 0.23, consistent with moderate to severe obstruction.  Endocarditis prophylaxis is indicated 5. Mechanical MVR: Normal by echo in February 2017.  Endocarditis prophylaxis is indicated. 6. Warfarin: Subtherapeutic INR last week.  Recheck today. 7. HTN: Fair control, blood pressure usually lower than today.   Medication Adjustments/Labs and Tests Ordered: Current medicines are reviewed at length with the patient today.  Concerns regarding medicines are outlined above.  Medication changes, Labs and Tests ordered today are listed in the Patient Instructions below. Patient Instructions  Medication Instructions:  Your physician recommends that you continue on your current medications as directed. Please refer to the Current Medication list given to you today.  If you need a refill on your cardiac medications before your next appointment, please call your pharmacy.   Lab work: Your provider would like for you to have the following labs today: INR  If you have labs (blood work) drawn today and your tests are completely normal, you will receive your results only by: [place] (if you have MyChart) OR A paper copy in the mail If you have any lab test that is abnormal or we need to change your treatment, we will call you to review the results.  Testing/Procedures: None ordered  Follow-Up: At [facility], you and your health needs are  our priority.  As part of our continuing mission to provide you with exceptional heart care, we have created designated Provider Care Teams.  These Care Teams include your primary Cardiologist (physician) and Advanced Practice Providers (APPs -  Physician Assistants and Nurse Practitioners) who all work together to provide you with the care you need, when you need it. You will need a follow up appointment in 12 months.  Please call our office 2 months in advance to schedule this appointment.  You may see Sanda Klein, MD or one of the following Advanced Practice Providers on your designated Care Team: Almyra Deforest, PA-C Fabian Sharp, [place]            Signed, Sanda Klein, MD  06/08/2019 8:58 AM    [address], [place], [place]  [address] Phone: [phone]; Fax: [phone]

## 2019-06-08 NOTE — Patient Instructions (Signed)
Medication Instructions:  Your physician recommends that you continue on your current medications as directed. Please refer to the Current Medication list given to you today.  If you need a refill on your cardiac medications before your next appointment, please call your pharmacy.   Lab work: Your provider would like for you to have the following labs today: INR  If you have labs (blood work) drawn today and your tests are completely normal, you will receive your results only by: [place] (if you have MyChart) OR A paper copy in the mail If you have any lab test that is abnormal or we need to change your treatment, we will call you to review the results.  Testing/Procedures: None ordered  Follow-Up: At [facility], you and your health needs are our priority.  As part of our continuing mission to provide you with exceptional heart care, we have created designated Provider Care Teams.  These Care Teams include your primary Cardiologist (physician) and Advanced Practice Providers (APPs -  Physician Assistants and Nurse Practitioners) who all work together to provide you with the care you need, when you need it. You will need a follow up appointment in 12 months.  Please call our office 2 months in advance to schedule this appointment.  You may see Sanda Klein, MD or one of the following Advanced Practice Providers on your designated Care Team: Almyra Deforest, PA-C Fabian Sharp, [place]

## 2019-06-09 ENCOUNTER — Other Ambulatory Visit (INDEPENDENT_AMBULATORY_CARE_PROVIDER_SITE_OTHER): Payer: Medicare Other

## 2019-06-09 DIAGNOSIS — I4891 Unspecified atrial fibrillation: Secondary | ICD-10-CM

## 2019-06-09 DIAGNOSIS — Z952 Presence of prosthetic heart valve: Secondary | ICD-10-CM

## 2019-06-09 DIAGNOSIS — Z950 Presence of cardiac pacemaker: Secondary | ICD-10-CM

## 2019-06-09 DIAGNOSIS — I4821 Permanent atrial fibrillation: Secondary | ICD-10-CM

## 2019-06-09 DIAGNOSIS — Z7901 Long term (current) use of anticoagulants: Secondary | ICD-10-CM

## 2019-06-09 DIAGNOSIS — I10 Essential (primary) hypertension: Secondary | ICD-10-CM

## 2019-06-10 DIAGNOSIS — I10 Essential (primary) hypertension: Secondary | ICD-10-CM | POA: Diagnosis not present

## 2019-06-10 DIAGNOSIS — R300 Dysuria: Secondary | ICD-10-CM | POA: Diagnosis not present

## 2019-06-10 DIAGNOSIS — Z0001 Encounter for general adult medical examination with abnormal findings: Secondary | ICD-10-CM | POA: Diagnosis not present

## 2019-06-10 DIAGNOSIS — D126 Benign neoplasm of colon, unspecified: Secondary | ICD-10-CM | POA: Diagnosis not present

## 2019-06-10 DIAGNOSIS — I700 Atherosclerosis of aorta: Secondary | ICD-10-CM | POA: Diagnosis not present

## 2019-06-19 ENCOUNTER — Ambulatory Visit (INDEPENDENT_AMBULATORY_CARE_PROVIDER_SITE_OTHER): Payer: Medicare Other | Admitting: *Deleted

## 2019-06-19 DIAGNOSIS — I4891 Unspecified atrial fibrillation: Secondary | ICD-10-CM

## 2019-06-19 DIAGNOSIS — R55 Syncope and collapse: Secondary | ICD-10-CM | POA: Diagnosis not present

## 2019-06-19 LAB — CUP PACEART REMOTE DEVICE CHECK
Battery Impedance: 1223 Ohm
Battery Remaining Longevity: 54 mo
Battery Voltage: 2.77 V
Brady Statistic RV Percent Paced: 100 %
Date Time Interrogation Session: 20200814160701
Implantable Lead Implant Date: 20110701
Implantable Lead Implant Date: 20110701
Implantable Lead Location: 753859
Implantable Lead Location: 753860
Implantable Lead Model: 5076
Implantable Lead Model: 5076
Implantable Pulse Generator Implant Date: 20110701
Lead Channel Impedance Value: 475 Ohm
Lead Channel Impedance Value: 67 Ohm
Lead Channel Pacing Threshold Amplitude: 0.5 V
Lead Channel Pacing Threshold Pulse Width: 0.4 ms
Lead Channel Setting Pacing Amplitude: 2.5 V
Lead Channel Setting Pacing Pulse Width: 0.4 ms
Lead Channel Setting Sensing Sensitivity: 2 mV

## 2019-06-25 DIAGNOSIS — H524 Presbyopia: Secondary | ICD-10-CM | POA: Diagnosis not present

## 2019-06-25 DIAGNOSIS — H401221 Low-tension glaucoma, left eye, mild stage: Secondary | ICD-10-CM | POA: Diagnosis not present

## 2019-06-26 NOTE — Progress Notes (Signed)
Remote pacemaker transmission.   

## 2019-07-01 ENCOUNTER — Other Ambulatory Visit: Payer: Self-pay

## 2019-07-01 ENCOUNTER — Ambulatory Visit (INDEPENDENT_AMBULATORY_CARE_PROVIDER_SITE_OTHER): Payer: Medicare Other | Admitting: *Deleted

## 2019-07-01 ENCOUNTER — Other Ambulatory Visit: Payer: Self-pay | Admitting: Cardiovascular Disease

## 2019-07-01 DIAGNOSIS — Z7901 Long term (current) use of anticoagulants: Secondary | ICD-10-CM | POA: Diagnosis not present

## 2019-07-01 DIAGNOSIS — I4891 Unspecified atrial fibrillation: Secondary | ICD-10-CM

## 2019-07-01 DIAGNOSIS — I4821 Permanent atrial fibrillation: Secondary | ICD-10-CM | POA: Diagnosis not present

## 2019-07-01 DIAGNOSIS — Z952 Presence of prosthetic heart valve: Secondary | ICD-10-CM | POA: Diagnosis not present

## 2019-07-01 DIAGNOSIS — Z5181 Encounter for therapeutic drug level monitoring: Secondary | ICD-10-CM | POA: Diagnosis not present

## 2019-07-01 LAB — POCT INR: INR: 2.6 (ref 2.0–3.0)

## 2019-07-01 NOTE — Patient Instructions (Signed)
Continue taking 2 tablets daily except for 3 tablets every Tuesday.  Recheck in 4 weeks

## 2019-07-17 DIAGNOSIS — H18413 Arcus senilis, bilateral: Secondary | ICD-10-CM | POA: Diagnosis not present

## 2019-07-17 DIAGNOSIS — Z961 Presence of intraocular lens: Secondary | ICD-10-CM | POA: Diagnosis not present

## 2019-07-17 DIAGNOSIS — H401132 Primary open-angle glaucoma, bilateral, moderate stage: Secondary | ICD-10-CM | POA: Diagnosis not present

## 2019-07-17 DIAGNOSIS — I10 Essential (primary) hypertension: Secondary | ICD-10-CM | POA: Diagnosis not present

## 2019-07-17 DIAGNOSIS — H26492 Other secondary cataract, left eye: Secondary | ICD-10-CM | POA: Diagnosis not present

## 2019-07-17 DIAGNOSIS — H26491 Other secondary cataract, right eye: Secondary | ICD-10-CM | POA: Diagnosis not present

## 2019-07-29 ENCOUNTER — Ambulatory Visit (INDEPENDENT_AMBULATORY_CARE_PROVIDER_SITE_OTHER): Payer: Medicare Other | Admitting: *Deleted

## 2019-07-29 ENCOUNTER — Other Ambulatory Visit: Payer: Self-pay

## 2019-07-29 DIAGNOSIS — Z952 Presence of prosthetic heart valve: Secondary | ICD-10-CM

## 2019-07-29 DIAGNOSIS — I4891 Unspecified atrial fibrillation: Secondary | ICD-10-CM | POA: Diagnosis not present

## 2019-07-29 DIAGNOSIS — Z7901 Long term (current) use of anticoagulants: Secondary | ICD-10-CM

## 2019-07-29 DIAGNOSIS — I4819 Other persistent atrial fibrillation: Secondary | ICD-10-CM

## 2019-07-29 DIAGNOSIS — Z5181 Encounter for therapeutic drug level monitoring: Secondary | ICD-10-CM

## 2019-07-29 LAB — POCT INR: INR: 1.7 — AB (ref 2.0–3.0)

## 2019-07-29 NOTE — Patient Instructions (Signed)
Take 4 tablets tonight then resume 2 tablets daily except for 3 tablets every Tuesday.  Recheck in 3  weeks

## 2019-08-04 DIAGNOSIS — H26492 Other secondary cataract, left eye: Secondary | ICD-10-CM | POA: Diagnosis not present

## 2019-08-04 DIAGNOSIS — Z961 Presence of intraocular lens: Secondary | ICD-10-CM | POA: Diagnosis not present

## 2019-08-19 ENCOUNTER — Ambulatory Visit (INDEPENDENT_AMBULATORY_CARE_PROVIDER_SITE_OTHER): Payer: Medicare Other | Admitting: *Deleted

## 2019-08-19 ENCOUNTER — Other Ambulatory Visit: Payer: Self-pay

## 2019-08-19 DIAGNOSIS — Z952 Presence of prosthetic heart valve: Secondary | ICD-10-CM | POA: Diagnosis not present

## 2019-08-19 DIAGNOSIS — Z7901 Long term (current) use of anticoagulants: Secondary | ICD-10-CM

## 2019-08-19 DIAGNOSIS — Z5181 Encounter for therapeutic drug level monitoring: Secondary | ICD-10-CM | POA: Diagnosis not present

## 2019-08-19 DIAGNOSIS — I4891 Unspecified atrial fibrillation: Secondary | ICD-10-CM

## 2019-08-19 LAB — POCT INR: INR: 3.2 — AB (ref 2.0–3.0)

## 2019-08-19 NOTE — Patient Instructions (Signed)
Take 1 tablet today then resume 2 tablets daily except for 3 tablets every Tuesday.  Recheck in 3  weeks

## 2019-09-09 ENCOUNTER — Other Ambulatory Visit: Payer: Self-pay

## 2019-09-09 ENCOUNTER — Ambulatory Visit (INDEPENDENT_AMBULATORY_CARE_PROVIDER_SITE_OTHER): Payer: Medicare Other | Admitting: *Deleted

## 2019-09-09 DIAGNOSIS — R945 Abnormal results of liver function studies: Secondary | ICD-10-CM | POA: Diagnosis not present

## 2019-09-09 DIAGNOSIS — Z5181 Encounter for therapeutic drug level monitoring: Secondary | ICD-10-CM | POA: Diagnosis not present

## 2019-09-09 DIAGNOSIS — I4891 Unspecified atrial fibrillation: Secondary | ICD-10-CM | POA: Diagnosis not present

## 2019-09-09 DIAGNOSIS — E559 Vitamin D deficiency, unspecified: Secondary | ICD-10-CM | POA: Diagnosis not present

## 2019-09-09 DIAGNOSIS — Z7901 Long term (current) use of anticoagulants: Secondary | ICD-10-CM | POA: Diagnosis not present

## 2019-09-09 DIAGNOSIS — K219 Gastro-esophageal reflux disease without esophagitis: Secondary | ICD-10-CM | POA: Diagnosis not present

## 2019-09-09 DIAGNOSIS — Z952 Presence of prosthetic heart valve: Secondary | ICD-10-CM

## 2019-09-09 DIAGNOSIS — E039 Hypothyroidism, unspecified: Secondary | ICD-10-CM | POA: Diagnosis not present

## 2019-09-09 DIAGNOSIS — I10 Essential (primary) hypertension: Secondary | ICD-10-CM | POA: Diagnosis not present

## 2019-09-09 DIAGNOSIS — D649 Anemia, unspecified: Secondary | ICD-10-CM | POA: Diagnosis not present

## 2019-09-09 LAB — POCT INR: INR: 3.3 — AB (ref 2.0–3.0)

## 2019-09-09 NOTE — Patient Instructions (Signed)
Hold warfarin tonight then decrease dose to 2 tablet daily. Recheck in 3 weeks

## 2019-09-15 DIAGNOSIS — I10 Essential (primary) hypertension: Secondary | ICD-10-CM | POA: Diagnosis not present

## 2019-09-15 DIAGNOSIS — I700 Atherosclerosis of aorta: Secondary | ICD-10-CM | POA: Diagnosis not present

## 2019-09-15 DIAGNOSIS — D126 Benign neoplasm of colon, unspecified: Secondary | ICD-10-CM | POA: Diagnosis not present

## 2019-09-15 DIAGNOSIS — E782 Mixed hyperlipidemia: Secondary | ICD-10-CM | POA: Diagnosis not present

## 2019-09-21 ENCOUNTER — Ambulatory Visit (INDEPENDENT_AMBULATORY_CARE_PROVIDER_SITE_OTHER): Payer: Medicare Other | Admitting: *Deleted

## 2019-09-21 DIAGNOSIS — I472 Ventricular tachycardia: Secondary | ICD-10-CM

## 2019-09-21 DIAGNOSIS — I4891 Unspecified atrial fibrillation: Secondary | ICD-10-CM

## 2019-09-21 DIAGNOSIS — I4729 Other ventricular tachycardia: Secondary | ICD-10-CM

## 2019-09-23 LAB — CUP PACEART REMOTE DEVICE CHECK
Battery Impedance: 1357 Ohm
Battery Remaining Longevity: 51 mo
Battery Voltage: 2.77 V
Brady Statistic RV Percent Paced: 99 %
Date Time Interrogation Session: 20201117161628
Implantable Lead Implant Date: 20110701
Implantable Lead Implant Date: 20110701
Implantable Lead Location: 753859
Implantable Lead Location: 753860
Implantable Lead Model: 5076
Implantable Lead Model: 5076
Implantable Pulse Generator Implant Date: 20110701
Lead Channel Impedance Value: 491 Ohm
Lead Channel Impedance Value: 67 Ohm
Lead Channel Pacing Threshold Amplitude: 0.625 V
Lead Channel Pacing Threshold Pulse Width: 0.4 ms
Lead Channel Setting Pacing Amplitude: 2.5 V
Lead Channel Setting Pacing Pulse Width: 0.4 ms
Lead Channel Setting Sensing Sensitivity: 2.8 mV

## 2019-09-30 ENCOUNTER — Other Ambulatory Visit: Payer: Self-pay

## 2019-09-30 ENCOUNTER — Ambulatory Visit (INDEPENDENT_AMBULATORY_CARE_PROVIDER_SITE_OTHER): Payer: Medicare Other | Admitting: *Deleted

## 2019-09-30 ENCOUNTER — Other Ambulatory Visit: Payer: Self-pay | Admitting: Cardiovascular Disease

## 2019-09-30 DIAGNOSIS — I4891 Unspecified atrial fibrillation: Secondary | ICD-10-CM

## 2019-09-30 DIAGNOSIS — Z5181 Encounter for therapeutic drug level monitoring: Secondary | ICD-10-CM

## 2019-09-30 DIAGNOSIS — Z7901 Long term (current) use of anticoagulants: Secondary | ICD-10-CM

## 2019-09-30 DIAGNOSIS — Z952 Presence of prosthetic heart valve: Secondary | ICD-10-CM

## 2019-09-30 LAB — POCT INR: INR: 1.9 — AB (ref 2.0–3.0)

## 2019-09-30 NOTE — Patient Instructions (Signed)
Take warfarin 3 tablets tonight then resume 2 tablet daily. Recheck in 4 weeks

## 2019-10-16 NOTE — Progress Notes (Signed)
Remote pacemaker transmission.   

## 2019-10-28 ENCOUNTER — Ambulatory Visit (INDEPENDENT_AMBULATORY_CARE_PROVIDER_SITE_OTHER): Payer: Medicare Other | Admitting: *Deleted

## 2019-10-28 ENCOUNTER — Other Ambulatory Visit: Payer: Self-pay

## 2019-10-28 DIAGNOSIS — I4891 Unspecified atrial fibrillation: Secondary | ICD-10-CM | POA: Diagnosis not present

## 2019-10-28 DIAGNOSIS — Z5181 Encounter for therapeutic drug level monitoring: Secondary | ICD-10-CM

## 2019-10-28 DIAGNOSIS — Z952 Presence of prosthetic heart valve: Secondary | ICD-10-CM

## 2019-10-28 DIAGNOSIS — Z7901 Long term (current) use of anticoagulants: Secondary | ICD-10-CM | POA: Diagnosis not present

## 2019-10-28 LAB — POCT INR: INR: 2.4 (ref 2.0–3.0)

## 2019-10-28 NOTE — Patient Instructions (Signed)
Continue warfarin 2 tablets daily Recheck in 4 weeks 

## 2019-11-05 DIAGNOSIS — I10 Essential (primary) hypertension: Secondary | ICD-10-CM | POA: Diagnosis not present

## 2019-11-05 DIAGNOSIS — E782 Mixed hyperlipidemia: Secondary | ICD-10-CM | POA: Diagnosis not present

## 2019-11-17 DIAGNOSIS — H401221 Low-tension glaucoma, left eye, mild stage: Secondary | ICD-10-CM | POA: Diagnosis not present

## 2019-11-25 ENCOUNTER — Other Ambulatory Visit: Payer: Self-pay

## 2019-11-25 ENCOUNTER — Ambulatory Visit (INDEPENDENT_AMBULATORY_CARE_PROVIDER_SITE_OTHER): Payer: Medicare Other | Admitting: *Deleted

## 2019-11-25 DIAGNOSIS — Z5181 Encounter for therapeutic drug level monitoring: Secondary | ICD-10-CM

## 2019-11-25 DIAGNOSIS — Z952 Presence of prosthetic heart valve: Secondary | ICD-10-CM

## 2019-11-25 LAB — POCT INR: INR: 2.5 (ref 2.0–3.0)

## 2019-11-25 NOTE — Patient Instructions (Signed)
Continue warfarin 2 tablets daily Recheck in 4 weeks 

## 2019-12-04 DIAGNOSIS — I10 Essential (primary) hypertension: Secondary | ICD-10-CM | POA: Diagnosis not present

## 2019-12-04 DIAGNOSIS — E7849 Other hyperlipidemia: Secondary | ICD-10-CM | POA: Diagnosis not present

## 2019-12-10 DIAGNOSIS — I10 Essential (primary) hypertension: Secondary | ICD-10-CM | POA: Diagnosis not present

## 2019-12-10 DIAGNOSIS — E782 Mixed hyperlipidemia: Secondary | ICD-10-CM | POA: Diagnosis not present

## 2019-12-10 DIAGNOSIS — K219 Gastro-esophageal reflux disease without esophagitis: Secondary | ICD-10-CM | POA: Diagnosis not present

## 2019-12-17 DIAGNOSIS — I10 Essential (primary) hypertension: Secondary | ICD-10-CM | POA: Diagnosis not present

## 2019-12-17 DIAGNOSIS — Z950 Presence of cardiac pacemaker: Secondary | ICD-10-CM | POA: Diagnosis not present

## 2019-12-17 DIAGNOSIS — E782 Mixed hyperlipidemia: Secondary | ICD-10-CM | POA: Diagnosis not present

## 2019-12-17 DIAGNOSIS — Z952 Presence of prosthetic heart valve: Secondary | ICD-10-CM | POA: Diagnosis not present

## 2019-12-21 ENCOUNTER — Ambulatory Visit (INDEPENDENT_AMBULATORY_CARE_PROVIDER_SITE_OTHER): Payer: Medicare Other | Admitting: *Deleted

## 2019-12-21 DIAGNOSIS — I472 Ventricular tachycardia: Secondary | ICD-10-CM

## 2019-12-21 DIAGNOSIS — I4729 Other ventricular tachycardia: Secondary | ICD-10-CM

## 2019-12-21 LAB — CUP PACEART REMOTE DEVICE CHECK
Battery Impedance: 1440 Ohm
Battery Remaining Longevity: 49 mo
Battery Voltage: 2.77 V
Brady Statistic RV Percent Paced: 99 %
Date Time Interrogation Session: 20210215100948
Implantable Lead Implant Date: 20110701
Implantable Lead Implant Date: 20110701
Implantable Lead Location: 753859
Implantable Lead Location: 753860
Implantable Lead Model: 5076
Implantable Lead Model: 5076
Implantable Pulse Generator Implant Date: 20110701
Lead Channel Impedance Value: 519 Ohm
Lead Channel Impedance Value: 67 Ohm
Lead Channel Pacing Threshold Amplitude: 0.625 V
Lead Channel Pacing Threshold Pulse Width: 0.4 ms
Lead Channel Setting Pacing Amplitude: 2.5 V
Lead Channel Setting Pacing Pulse Width: 0.4 ms
Lead Channel Setting Sensing Sensitivity: 2.8 mV

## 2019-12-22 NOTE — Progress Notes (Signed)
PPM Remote  

## 2019-12-23 ENCOUNTER — Ambulatory Visit (INDEPENDENT_AMBULATORY_CARE_PROVIDER_SITE_OTHER): Payer: Medicare Other | Admitting: *Deleted

## 2019-12-23 ENCOUNTER — Other Ambulatory Visit: Payer: Self-pay

## 2019-12-23 DIAGNOSIS — Z952 Presence of prosthetic heart valve: Secondary | ICD-10-CM

## 2019-12-23 DIAGNOSIS — Z5181 Encounter for therapeutic drug level monitoring: Secondary | ICD-10-CM

## 2019-12-23 DIAGNOSIS — I4891 Unspecified atrial fibrillation: Secondary | ICD-10-CM | POA: Diagnosis not present

## 2019-12-23 LAB — POCT INR: INR: 2 (ref 2.0–3.0)

## 2019-12-23 NOTE — Patient Instructions (Signed)
Continue warfarin 2 tablets daily Recheck in 4 weeks 

## 2020-01-01 DIAGNOSIS — I10 Essential (primary) hypertension: Secondary | ICD-10-CM | POA: Diagnosis not present

## 2020-01-01 DIAGNOSIS — E7849 Other hyperlipidemia: Secondary | ICD-10-CM | POA: Diagnosis not present

## 2020-01-20 ENCOUNTER — Ambulatory Visit (INDEPENDENT_AMBULATORY_CARE_PROVIDER_SITE_OTHER): Payer: Medicare Other | Admitting: *Deleted

## 2020-01-20 ENCOUNTER — Other Ambulatory Visit: Payer: Self-pay

## 2020-01-20 DIAGNOSIS — Z5181 Encounter for therapeutic drug level monitoring: Secondary | ICD-10-CM | POA: Diagnosis not present

## 2020-01-20 DIAGNOSIS — Z952 Presence of prosthetic heart valve: Secondary | ICD-10-CM

## 2020-01-20 LAB — POCT INR: INR: 2.9 (ref 2.0–3.0)

## 2020-01-20 NOTE — Patient Instructions (Signed)
Take 1 tablet tonight then resume 2 tablets daily. Recheck in 4 weeks

## 2020-01-31 ENCOUNTER — Other Ambulatory Visit: Payer: Self-pay | Admitting: Cardiovascular Disease

## 2020-02-03 DIAGNOSIS — I10 Essential (primary) hypertension: Secondary | ICD-10-CM | POA: Diagnosis not present

## 2020-02-03 DIAGNOSIS — E7849 Other hyperlipidemia: Secondary | ICD-10-CM | POA: Diagnosis not present

## 2020-02-17 ENCOUNTER — Ambulatory Visit (INDEPENDENT_AMBULATORY_CARE_PROVIDER_SITE_OTHER): Payer: Medicare Other | Admitting: *Deleted

## 2020-02-17 ENCOUNTER — Other Ambulatory Visit: Payer: Self-pay

## 2020-02-17 DIAGNOSIS — Z5181 Encounter for therapeutic drug level monitoring: Secondary | ICD-10-CM | POA: Diagnosis not present

## 2020-02-17 DIAGNOSIS — Z952 Presence of prosthetic heart valve: Secondary | ICD-10-CM

## 2020-02-17 DIAGNOSIS — I4891 Unspecified atrial fibrillation: Secondary | ICD-10-CM | POA: Diagnosis not present

## 2020-02-17 LAB — POCT INR: INR: 2.9 (ref 2.0–3.0)

## 2020-02-17 NOTE — Patient Instructions (Signed)
Decrease warfarin to 2 tablets daily except 1 tablet on Wednesdays Recheck in 4 weeks 

## 2020-03-04 DIAGNOSIS — E7849 Other hyperlipidemia: Secondary | ICD-10-CM | POA: Diagnosis not present

## 2020-03-04 DIAGNOSIS — I10 Essential (primary) hypertension: Secondary | ICD-10-CM | POA: Diagnosis not present

## 2020-03-16 ENCOUNTER — Other Ambulatory Visit: Payer: Self-pay

## 2020-03-16 ENCOUNTER — Ambulatory Visit (INDEPENDENT_AMBULATORY_CARE_PROVIDER_SITE_OTHER): Payer: Medicare Other | Admitting: *Deleted

## 2020-03-16 DIAGNOSIS — Z5181 Encounter for therapeutic drug level monitoring: Secondary | ICD-10-CM | POA: Diagnosis not present

## 2020-03-16 DIAGNOSIS — I4891 Unspecified atrial fibrillation: Secondary | ICD-10-CM

## 2020-03-16 DIAGNOSIS — E039 Hypothyroidism, unspecified: Secondary | ICD-10-CM | POA: Diagnosis not present

## 2020-03-16 DIAGNOSIS — Z952 Presence of prosthetic heart valve: Secondary | ICD-10-CM | POA: Diagnosis not present

## 2020-03-16 DIAGNOSIS — N183 Chronic kidney disease, stage 3 unspecified: Secondary | ICD-10-CM | POA: Diagnosis not present

## 2020-03-16 DIAGNOSIS — K219 Gastro-esophageal reflux disease without esophagitis: Secondary | ICD-10-CM | POA: Diagnosis not present

## 2020-03-16 DIAGNOSIS — I10 Essential (primary) hypertension: Secondary | ICD-10-CM | POA: Diagnosis not present

## 2020-03-16 DIAGNOSIS — R945 Abnormal results of liver function studies: Secondary | ICD-10-CM | POA: Diagnosis not present

## 2020-03-16 LAB — POCT INR: INR: 2.7 (ref 2.0–3.0)

## 2020-03-16 NOTE — Patient Instructions (Signed)
Hold warfarin today then resume 2 tablets daily except 1 tablet on Wednesdays Recheck in 4 weeks

## 2020-03-17 DIAGNOSIS — M810 Age-related osteoporosis without current pathological fracture: Secondary | ICD-10-CM | POA: Diagnosis not present

## 2020-03-17 DIAGNOSIS — M859 Disorder of bone density and structure, unspecified: Secondary | ICD-10-CM | POA: Diagnosis not present

## 2020-03-17 DIAGNOSIS — Z0389 Encounter for observation for other suspected diseases and conditions ruled out: Secondary | ICD-10-CM | POA: Diagnosis not present

## 2020-03-21 ENCOUNTER — Ambulatory Visit (INDEPENDENT_AMBULATORY_CARE_PROVIDER_SITE_OTHER): Payer: Medicare Other | Admitting: *Deleted

## 2020-03-21 DIAGNOSIS — I4891 Unspecified atrial fibrillation: Secondary | ICD-10-CM

## 2020-03-21 DIAGNOSIS — I472 Ventricular tachycardia: Secondary | ICD-10-CM

## 2020-03-21 DIAGNOSIS — I4729 Other ventricular tachycardia: Secondary | ICD-10-CM

## 2020-03-22 ENCOUNTER — Telehealth: Payer: Self-pay

## 2020-03-22 DIAGNOSIS — I10 Essential (primary) hypertension: Secondary | ICD-10-CM | POA: Diagnosis not present

## 2020-03-22 DIAGNOSIS — I4821 Permanent atrial fibrillation: Secondary | ICD-10-CM | POA: Diagnosis not present

## 2020-03-22 DIAGNOSIS — Z950 Presence of cardiac pacemaker: Secondary | ICD-10-CM | POA: Diagnosis not present

## 2020-03-22 DIAGNOSIS — Z952 Presence of prosthetic heart valve: Secondary | ICD-10-CM | POA: Diagnosis not present

## 2020-03-22 LAB — CUP PACEART REMOTE DEVICE CHECK
Battery Impedance: 1523 Ohm
Battery Impedance: 1525 Ohm
Battery Remaining Longevity: 46 mo
Battery Remaining Longevity: 47 mo
Battery Voltage: 2.77 V
Battery Voltage: 2.77 V
Brady Statistic RV Percent Paced: 99 %
Brady Statistic RV Percent Paced: 99 %
Date Time Interrogation Session: 20210518155355
Date Time Interrogation Session: 20210518155816
Implantable Lead Implant Date: 20110701
Implantable Lead Implant Date: 20110701
Implantable Lead Implant Date: 20110701
Implantable Lead Implant Date: 20110701
Implantable Lead Location: 753859
Implantable Lead Location: 753859
Implantable Lead Location: 753860
Implantable Lead Location: 753860
Implantable Lead Model: 5076
Implantable Lead Model: 5076
Implantable Lead Model: 5076
Implantable Lead Model: 5076
Implantable Pulse Generator Implant Date: 20110701
Implantable Pulse Generator Implant Date: 20110701
Lead Channel Impedance Value: 496 Ohm
Lead Channel Impedance Value: 516 Ohm
Lead Channel Impedance Value: 67 Ohm
Lead Channel Impedance Value: 67 Ohm
Lead Channel Pacing Threshold Amplitude: 0.75 V
Lead Channel Pacing Threshold Amplitude: 0.75 V
Lead Channel Pacing Threshold Pulse Width: 0.4 ms
Lead Channel Pacing Threshold Pulse Width: 0.4 ms
Lead Channel Setting Pacing Amplitude: 2.5 V
Lead Channel Setting Pacing Amplitude: 2.5 V
Lead Channel Setting Pacing Pulse Width: 0.4 ms
Lead Channel Setting Pacing Pulse Width: 0.4 ms
Lead Channel Setting Sensing Sensitivity: 2.8 mV
Lead Channel Setting Sensing Sensitivity: 2.8 mV

## 2020-03-22 NOTE — Telephone Encounter (Signed)
Unable to speak  with patient to remind of missed remote transmission 

## 2020-03-22 NOTE — Telephone Encounter (Signed)
Patient called and says she needs to set up an appointment to see [facility] Please call her back to schedule

## 2020-03-23 NOTE — Progress Notes (Signed)
Remote pacemaker transmission.   

## 2020-04-04 DIAGNOSIS — N183 Chronic kidney disease, stage 3 unspecified: Secondary | ICD-10-CM | POA: Diagnosis not present

## 2020-04-04 DIAGNOSIS — I129 Hypertensive chronic kidney disease with stage 1 through stage 4 chronic kidney disease, or unspecified chronic kidney disease: Secondary | ICD-10-CM | POA: Diagnosis not present

## 2020-04-04 DIAGNOSIS — E7849 Other hyperlipidemia: Secondary | ICD-10-CM | POA: Diagnosis not present

## 2020-04-04 DIAGNOSIS — I482 Chronic atrial fibrillation, unspecified: Secondary | ICD-10-CM | POA: Diagnosis not present

## 2020-04-05 DIAGNOSIS — H401221 Low-tension glaucoma, left eye, mild stage: Secondary | ICD-10-CM | POA: Diagnosis not present

## 2020-04-13 ENCOUNTER — Other Ambulatory Visit: Payer: Self-pay

## 2020-04-13 ENCOUNTER — Ambulatory Visit (INDEPENDENT_AMBULATORY_CARE_PROVIDER_SITE_OTHER): Payer: Medicare Other | Admitting: Pharmacist

## 2020-04-13 DIAGNOSIS — Z5181 Encounter for therapeutic drug level monitoring: Secondary | ICD-10-CM | POA: Diagnosis not present

## 2020-04-13 DIAGNOSIS — I4891 Unspecified atrial fibrillation: Secondary | ICD-10-CM

## 2020-04-13 DIAGNOSIS — Z952 Presence of prosthetic heart valve: Secondary | ICD-10-CM

## 2020-04-13 DIAGNOSIS — Z7901 Long term (current) use of anticoagulants: Secondary | ICD-10-CM

## 2020-04-13 LAB — POCT INR: INR: 1.9 — AB (ref 2.0–3.0)

## 2020-04-13 NOTE — Patient Instructions (Signed)
Description   Take 1.5 tablets today, the continue taking 2 tablets daily except 1 tablet on Wednesdays Recheck in 4 weeks

## 2020-05-11 DIAGNOSIS — H524 Presbyopia: Secondary | ICD-10-CM | POA: Diagnosis not present

## 2020-05-12 ENCOUNTER — Other Ambulatory Visit: Payer: Self-pay

## 2020-05-12 ENCOUNTER — Ambulatory Visit (INDEPENDENT_AMBULATORY_CARE_PROVIDER_SITE_OTHER): Payer: Medicare Other | Admitting: *Deleted

## 2020-05-12 DIAGNOSIS — Z5181 Encounter for therapeutic drug level monitoring: Secondary | ICD-10-CM | POA: Diagnosis not present

## 2020-05-12 DIAGNOSIS — Z952 Presence of prosthetic heart valve: Secondary | ICD-10-CM

## 2020-05-12 DIAGNOSIS — I4891 Unspecified atrial fibrillation: Secondary | ICD-10-CM | POA: Diagnosis not present

## 2020-05-12 LAB — POCT INR: INR: 2 (ref 2.0–3.0)

## 2020-05-12 NOTE — Patient Instructions (Signed)
Continue warfarin 2 tablets daily except 1 tablet on Wednesdays Recheck in 4 weeks

## 2020-05-29 ENCOUNTER — Other Ambulatory Visit: Payer: Self-pay | Admitting: Cardiovascular Disease

## 2020-06-09 ENCOUNTER — Ambulatory Visit (INDEPENDENT_AMBULATORY_CARE_PROVIDER_SITE_OTHER): Payer: Medicare Other | Admitting: *Deleted

## 2020-06-09 DIAGNOSIS — Z5181 Encounter for therapeutic drug level monitoring: Secondary | ICD-10-CM

## 2020-06-09 DIAGNOSIS — Z952 Presence of prosthetic heart valve: Secondary | ICD-10-CM | POA: Diagnosis not present

## 2020-06-09 DIAGNOSIS — I4891 Unspecified atrial fibrillation: Secondary | ICD-10-CM | POA: Diagnosis not present

## 2020-06-09 DIAGNOSIS — H5203 Hypermetropia, bilateral: Secondary | ICD-10-CM | POA: Diagnosis not present

## 2020-06-09 DIAGNOSIS — Z961 Presence of intraocular lens: Secondary | ICD-10-CM | POA: Diagnosis not present

## 2020-06-09 LAB — POCT INR: INR: 3.2 — AB (ref 2.0–3.0)

## 2020-06-09 NOTE — Patient Instructions (Signed)
Hold warfarin today then resume 2 tablets daily except 1 tablet on Wednesdays Recheck in 3 weeks

## 2020-06-13 ENCOUNTER — Ambulatory Visit (INDEPENDENT_AMBULATORY_CARE_PROVIDER_SITE_OTHER): Payer: Medicare Other | Admitting: Cardiovascular Disease

## 2020-06-13 ENCOUNTER — Encounter: Payer: Self-pay | Admitting: Cardiovascular Disease

## 2020-06-13 ENCOUNTER — Other Ambulatory Visit: Payer: Self-pay

## 2020-06-13 VITALS — BP 133/69 | HR 71 | Ht 61.0 in | Wt 134.6 lb

## 2020-06-13 DIAGNOSIS — I472 Ventricular tachycardia: Secondary | ICD-10-CM

## 2020-06-13 DIAGNOSIS — I10 Essential (primary) hypertension: Secondary | ICD-10-CM

## 2020-06-13 DIAGNOSIS — Z952 Presence of prosthetic heart valve: Secondary | ICD-10-CM | POA: Diagnosis not present

## 2020-06-13 DIAGNOSIS — I4729 Other ventricular tachycardia: Secondary | ICD-10-CM

## 2020-06-13 DIAGNOSIS — I4821 Permanent atrial fibrillation: Secondary | ICD-10-CM | POA: Diagnosis not present

## 2020-06-13 DIAGNOSIS — Z950 Presence of cardiac pacemaker: Secondary | ICD-10-CM | POA: Diagnosis not present

## 2020-06-13 DIAGNOSIS — Z7901 Long term (current) use of anticoagulants: Secondary | ICD-10-CM | POA: Diagnosis not present

## 2020-06-13 NOTE — Progress Notes (Signed)
Patient ID: Alice Ballard, female   DOB: [birth year], 79 y.o.   MRN: [id number]    Cardiology Office Note    Date:  06/14/2020   ID:  Alice Ballard, DOB [birth year], MRN [id number]  PCP:  Curlene Labrum, MD  Cardiologist:   Sanda Klein, MD   Chief Complaint  Patient presents with  . Atrial Fibrillation    History of Present Illness:  Alice Ballard is a 79 y.o. 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   She is done quite well since her last appointment without any major health challenges. The patient specifically denies any chest pain at rest exertion, dyspnea at rest or with exertion, orthopnea, paroxysmal nocturnal dyspnea, syncope, palpitations, focal neurological deficits, intermittent claudication, lower extremity edema, unexplained weight gain, cough, hemoptysis or wheezing.  She is compliant with warfarin anticoagulation (retested today, generally followed up in the ED in Coumadin clinic), and has not had any problems with falls, injuries or bleeding.  Pacemaker interrogation shows normal device function.  She is not pacemaker dependent but has virtually 100% ventricular pacing due to atrial fibrillation with slow ventricular response (45-50 bpm).  Estimated generator longevity is 3.5 years.  As before, she has approximately 2 episodes of nonsustained ventricular tachycardia a month, the longest one being about 5 or 6 seconds in duration.  These have been consistently asymptomatic.  Alice Ballard is a 79 year old woman with a mechanical aortic and mitral valve prostheses as well as long-term persistent atrial fibrillation, hypertension, dyslipidemia. In 1986 her mitral valve was replaced with a 29 mm St. Jude prosthesis for rheumatic disease. In 1994 her aortic valve was replaced with a 19 mm St. Jude device. Has  chronic moderate elevation in the valvular gradients across the aortic valve, likely an expression of patient-valve mismatch. Fluoroscopy has shown normal disc motion. Previous right heart catheterization had shown only very mild pulmonary hypertension (mean PA pressure 25 mm Hg, wedge pressure 16 mm Hg) She has no history of coronary artery disease. She has a dual-chamber permanent pacemaker (Medtronic) that was implanted in 2011 for tachycardia-bradycardia syndrome, now programmed VVIR, virtually 100% V paced. She is on chronic warfarin anticoagulation both for her mechanical valves and for atrial fibrillation. She has not had serious bleeding complications or embolic events. She does not have a history of stroke.  Past Medical History:  Diagnosis Date  . Abnormality of gait 07/04/2016  . Cerebral vascular disease   . Coronary artery disease   . Excessive daytime sleepiness 07/04/2016  . Fatty liver   . Gait abnormality   . Gastric polyp   . GERD (gastroesophageal reflux disease)   . Hemorrhoids   . Hiatal hernia   . Hyperlipidemia   . Hypersomnolence   . Hypertension   . Pacemaker july 2011   medtronic adapta model # ADDRL 1,SERIAL [id number]  . Paroxysmal atrial fibrillation (HCC)   . Seizure disorder ([place])   . Sick sinus syndrome ([place])   . Spinal stenosis   . Tubular adenoma of colon 11/2010  . UTI (urinary tract infection)     Past Surgical History:  Procedure Laterality Date  . AORTIC AND MITRAL VALVE REPLACEMENT  1994   59mm St. Jude  . BACK SURGERY    . CARDIAC CATHETERIZATION  04/2010  . DOPPLER ECHOCARDIOGRAPHY  sept 06, 2013   aotric valve grad. peak 51  mmHg and mean 28 mm Hg  dimensional subtractive index 0.26; mitral valve grad peak 25 and mean 48mm Hg norm pressure halftime 62msec.  [name] MITRAL VALVE REPLACEMENT  1986   St Jude prosthesis   . NM MYOCAR PERF WALL MOTION  03/31/2008   EF 69% low risk  . NM MYOCAR PERF WALL MOTION  04/19/2006   EF 69%  . PACEMAKER PLACEMENT   05/2010  . TEE WITHOUT CARDIOVERSION  07/11/2012   Procedure: TRANSESOPHAGEAL ECHOCARDIOGRAM (TEE);  Surgeon: Sanda Klein, MD;  Location: [facility] ENDOSCOPY;  Service: Cardiovascular;  Laterality: N/A;    Outpatient Medications Prior to Visit  Medication Sig Dispense Refill  . acetaminophen (TYLENOL) 500 MG tablet Take 1,000 mg by mouth every 6 (six) hours as needed. For pain    . alendronate (FOSAMAX) 70 MG tablet Take 70 mg by mouth every 7 (seven) days. Take with a full glass of water on an empty stomach. Takes on Sunday.    . Ascorbic Acid (VITAMIN C) 1000 MG tablet Take 1,000 mg by mouth daily.    [name] atorvastatin (LIPITOR) 80 MG tablet TAKE 1/2 TABLET BY MOUTH AT BEDTIME 45 tablet 3  . chlorthalidone (HYGROTON) 25 MG tablet Take 0.5 tablets (12.5 mg total) by mouth daily. 45 tablet 3  . Cholecalciferol (VITAMIN D3) 2000 UNITS capsule Take 2,000 Units by mouth daily.    . fluticasone (FLONASE) 50 MCG/ACT nasal spray Place 2 sprays into both nostrils 2 (two) times daily as needed.     . folic acid (FOLVITE) 1 MG tablet TAKE ONE TABLET BY MOUTH EVERY DAY 90 tablet 0  . levETIRAcetam (KEPPRA) 500 MG tablet Take 500 mg by mouth 2 (two) times daily.    [name] lisinopril (ZESTRIL) 30 MG tablet TAKE 1 TABLET BY MOUTH EVERY DAY 90 tablet 3  . omeprazole (PRILOSEC) 20 MG capsule Take 20 mg by mouth daily as needed.     [name] Glycol-Propyl Glycol (SYSTANE OP) Place 1 drop into both eyes daily as needed. For dry eyes    . vitamin B-12 (CYANOCOBALAMIN) 1000 MCG tablet Take 1,000 mcg by mouth daily.    [name] warfarin (COUMADIN) 2.5 MG tablet TAKE 1 TO 2 TABLETS BY MOUTH DAILY AS DIRECTED by coumadin clinic 60 tablet 3  . pantoprazole (PROTONIX) 40 MG tablet Take 40 mg by mouth daily as needed. For heartburn    . enoxaparin (LOVENOX) 60 MG/0.6ML injection Inject 60 mg (1 syringe) subcutaneously every 12 hours as directed 20 Syringe 0   No facility-administered medications prior to visit.     Allergies:    Shellfish allergy and Tegretol [carbamazepine]   Social History   Socioeconomic History  . Marital status: Married    Spouse name: Not on file  . Number of children: 1  . Years of education: Not on file  . Highest education level: Not on file  Occupational History  . Occupation: DISABLED  Tobacco Use  . Smoking status: Never Smoker  . Smokeless tobacco: Never Used  Substance and Sexual Activity  . Alcohol use: No  . Drug use: No  . Sexual activity: Not on file  Other Topics Concern  . Not on file  Social History Narrative   Lives at home with her husband.   Right-handed.   16-20oz of caffeine per day.   Social Determinants of Health   Financial Resource Strain:   . Difficulty of Paying Living Expenses:   Food Insecurity:   . Worried About [employer] of  Food in the Last Year:   . [place] in the Last Year:   Transportation Needs:   . Film/video editor (Medical):   [name] Lack of Transportation (Non-Medical):   Physical Activity:   . Days of Exercise per Week:   . Minutes of Exercise per Session:   Stress:   . Feeling of Stress :   Social Connections:   . Frequency of Communication with Friends and Family:   . Frequency of Social Gatherings with Friends and Family:   . Attends Religious Services:   . Active Member of Clubs or Organizations:   . Attends Archivist Meetings:   [name] Marital Status:      Family History:  The patient's family history includes Colon cancer in her sister; Colon polyps in her sister; Heart disease in her mother.   ROS:   Please see the history of present illness.    ROS All other systems are reviewed and are negative.   PHYSICAL EXAM:   VS:  BP 133/69   Pulse 71   Ht 5\' 1"  (1.549 m)   Wt 134 lb 9.6 oz (61.1 kg)   SpO2 99%   BMI 25.43 kg/m      General: Alert, oriented x3, no distress, healthy left subclavian pacemaker site Head: no evidence of trauma, PERRL, EOMI, no exophtalmos or lid lag, no myxedema, no  xanthelasma; normal ears, nose and oropharynx Neck: normal jugular venous pulsations and no hepatojugular reflux; brisk carotid pulses without delay and no carotid bruits Chest: clear to auscultation, no signs of consolidation by percussion or palpation, normal fremitus, symmetrical and full respiratory excursions Cardiovascular: normal position and quality of the apical impulse, regular rhythm, normal first and paradoxically split second heart sounds with crisp mechanical valve clicks, no diastolic murmurs, rubs or gallops Abdomen: no tenderness or distention, no masses by palpation, no abnormal pulsatility or arterial bruits, normal bowel sounds, no hepatosplenomegaly Extremities: no clubbing, cyanosis or edema; 2+ radial, ulnar and brachial pulses bilaterally; 2+ right femoral, posterior tibial and dorsalis pedis pulses; 2+ left femoral, posterior tibial and dorsalis pedis pulses; no subclavian or femoral bruits Neurological: grossly nonfocal Psych: Normal mood and affect   Wt Readings from Last 3 Encounters:  06/13/20 134 lb 9.6 oz (61.1 kg)  06/08/19 141 lb 3.2 oz (64 kg)  05/21/18 138 lb (62.6 kg)      Studies/Labs Reviewed:   EKG:  EKG is ordered today.  It shows atrial fibrillation with 100% ventricular paced rhythm  Recent Labs: December 10, 2019 Total cholesterol 143, HDL 61, LDL 62, triglycerides 83  Mar 16, 2020 Hemoglobin 12.2, creatinine 0.9, normal liver and thyroid function tests.  ASSESSMENT:    1. Permanent atrial fibrillation ([place])   2. Pacemaker: Medtronic 2011 for tachy brady AFIB   3. NSVT (nonsustained ventricular tachycardia) ([place])   4. Aortic valve replaced   5. History of mitral valve replacement, mechanical   6. Long term current use of anticoagulant therapy   7. Essential hypertension      PLAN:  In order of problems listed above:  1. Permanent atrial fibrillation: She has 100% ventricular pacing due to very slow ventricular response, even without  treatment with rate slowing medications.  Very high embolic risk due to the presence of 2 mechanical prostheses.  Anticoagulation should only be interrupted for important surgical procedures or critical bleeding complications.  Should be bridged with short acting agents such as enoxaparin or intravenous heparin as appropriate. 2.  PPM: She is not pacemaker dependent, remote downloads every 3 months and yearly office visits 3. NSVT: Every couple of weeks she has an episode of brief nonsustained VT which has never been symptomatic.  This pattern has been present for couple of years now. 4. Mechanical AVR: It is time to reevaluate the gradients across the aortic valve with an echocardiogram.  She has always had slightly elevated gradients consistent with moderate patient valve mismatch.  She does not have any symptoms of aortic valve stenosis.  Despite small prosthetic valve size area valve gradients are actually fairly low (peak 30, mean 18) and stable over time. The dimensionless obstructive index is low at 0.23, consistent with moderate to severe obstruction.  Endocarditis prophylaxis is indicated 5. Mechanical MVR: Normal by echo in February 2017.  Endocarditis prophylaxis is indicated. 6. Warfarin: Usually in therapeutic range; INR was 3.2 on August 5 7. HTN: Well-controlled   Medication Adjustments/Labs and Tests Ordered: Current medicines are reviewed at length with the patient today.  Concerns regarding medicines are outlined above.  Medication changes, Labs and Tests ordered today are listed in the Patient Instructions below. Patient Instructions  Medication Instructions:  No changes *If you need a refill on your cardiac medications before your next appointment, please call your pharmacy*   Lab Work: None ordered If you have labs (blood work) drawn today and your tests are completely normal, you will receive your results only by: [name] MyChart Message (if you have MyChart) OR . A paper copy in  the mail If you have any lab test that is abnormal or we need to change your treatment, we will call you to review the results.   Testing/Procedures: Your physician has requested that you have an echocardiogram. Echocardiography is a painless test that uses sound waves to create images of your heart. It provides your doctor with information about the size and shape of your heart and how well your heart's chambers and valves are working. You may receive an ultrasound enhancing agent through an IV if needed to better visualize your heart during the echo.This procedure takes approximately one hour. There are no restrictions for this procedure. This will take place at the [address].    Follow-Up: At [facility], you and your health needs are our priority.  As part of our continuing mission to provide you with exceptional heart care, we have created designated Provider Care Teams.  These Care Teams include your primary Cardiologist (physician) and Advanced Practice Providers (APPs -  Physician Assistants and Nurse Practitioners) who all work together to provide you with the care you need, when you need it.  We recommend signing up for the patient portal called "MyChart".  Sign up information is provided on this After Visit Summary.  MyChart is used to connect with patients for Virtual Visits (Telemedicine).  Patients are able to view lab/test results, encounter notes, upcoming appointments, etc.  Non-urgent messages can be sent to your provider as well.   To learn more about what you can do with MyChart, go to [email or web address].    Your next appointment:   12 month(s) on a pacer day  The format for your next appointment:   In Person  Provider:   Sanda Klein, MD       Signed, Sanda Klein, MD  06/14/2020 5:46 PM    [address], [place], [place]  [address] Phone: [phone]; Fax: [phone]

## 2020-06-13 NOTE — Patient Instructions (Signed)
Medication Instructions:  No changes *If you need a refill on your cardiac medications before your next appointment, please call your pharmacy*   Lab Work: None ordered If you have labs (blood work) drawn today and your tests are completely normal, you will receive your results only by: [name] MyChart Message (if you have MyChart) OR . A paper copy in the mail If you have any lab test that is abnormal or we need to change your treatment, we will call you to review the results.   Testing/Procedures: Your physician has requested that you have an echocardiogram. Echocardiography is a painless test that uses sound waves to create images of your heart. It provides your doctor with information about the size and shape of your heart and how well your heart's chambers and valves are working. You may receive an ultrasound enhancing agent through an IV if needed to better visualize your heart during the echo.This procedure takes approximately one hour. There are no restrictions for this procedure. This will take place at the [address], [address].    Follow-Up: At [facility], you and your health needs are our priority.  As part of our continuing mission to provide you with exceptional heart care, we have created designated Provider Care Teams.  These Care Teams include your primary Cardiologist (physician) and Advanced Practice Providers (APPs -  Physician Assistants and Nurse Practitioners) who all work together to provide you with the care you need, when you need it.  We recommend signing up for the patient portal called "MyChart".  Sign up information is provided on this After Visit Summary.  MyChart is used to connect with patients for Virtual Visits (Telemedicine).  Patients are able to view lab/test results, encounter notes, upcoming appointments, etc.  Non-urgent messages can be sent to your provider as well.   To learn more about what you can do with MyChart, go to [email or web address].     Your next appointment:   12 month(s) on a pacer day  The format for your next appointment:   In Person  Provider:   Sanda Klein, MD

## 2020-06-14 DIAGNOSIS — E559 Vitamin D deficiency, unspecified: Secondary | ICD-10-CM | POA: Diagnosis not present

## 2020-06-14 DIAGNOSIS — I10 Essential (primary) hypertension: Secondary | ICD-10-CM | POA: Diagnosis not present

## 2020-06-14 DIAGNOSIS — R945 Abnormal results of liver function studies: Secondary | ICD-10-CM | POA: Diagnosis not present

## 2020-06-14 DIAGNOSIS — N183 Chronic kidney disease, stage 3 unspecified: Secondary | ICD-10-CM | POA: Diagnosis not present

## 2020-06-20 ENCOUNTER — Ambulatory Visit (INDEPENDENT_AMBULATORY_CARE_PROVIDER_SITE_OTHER): Payer: Medicare Other | Admitting: *Deleted

## 2020-06-20 DIAGNOSIS — I4821 Permanent atrial fibrillation: Secondary | ICD-10-CM

## 2020-06-22 DIAGNOSIS — J019 Acute sinusitis, unspecified: Secondary | ICD-10-CM | POA: Diagnosis not present

## 2020-06-22 DIAGNOSIS — I700 Atherosclerosis of aorta: Secondary | ICD-10-CM | POA: Diagnosis not present

## 2020-06-22 DIAGNOSIS — Z952 Presence of prosthetic heart valve: Secondary | ICD-10-CM | POA: Diagnosis not present

## 2020-06-22 DIAGNOSIS — I4821 Permanent atrial fibrillation: Secondary | ICD-10-CM | POA: Diagnosis not present

## 2020-06-22 DIAGNOSIS — I10 Essential (primary) hypertension: Secondary | ICD-10-CM | POA: Diagnosis not present

## 2020-06-22 LAB — CUP PACEART REMOTE DEVICE CHECK
Battery Impedance: 1497 Ohm
Battery Remaining Longevity: 48 mo
Battery Voltage: 2.76 V
Brady Statistic RV Percent Paced: 99 %
Date Time Interrogation Session: 20210817125504
Implantable Lead Implant Date: 20110701
Implantable Lead Implant Date: 20110701
Implantable Lead Location: 753859
Implantable Lead Location: 753860
Implantable Lead Model: 5076
Implantable Lead Model: 5076
Implantable Pulse Generator Implant Date: 20110701
Lead Channel Impedance Value: 517 Ohm
Lead Channel Impedance Value: 67 Ohm
Lead Channel Pacing Threshold Amplitude: 0.625 V
Lead Channel Pacing Threshold Pulse Width: 0.4 ms
Lead Channel Setting Pacing Amplitude: 2.5 V
Lead Channel Setting Pacing Pulse Width: 0.4 ms
Lead Channel Setting Sensing Sensitivity: 2.8 mV

## 2020-06-27 ENCOUNTER — Other Ambulatory Visit: Payer: Self-pay | Admitting: Cardiovascular Disease

## 2020-06-28 NOTE — Progress Notes (Signed)
Remote pacemaker transmission.   

## 2020-06-29 ENCOUNTER — Ambulatory Visit (HOSPITAL_COMMUNITY): Payer: Medicare Other | Attending: Cardiology

## 2020-06-29 ENCOUNTER — Other Ambulatory Visit: Payer: Self-pay

## 2020-06-29 DIAGNOSIS — Z952 Presence of prosthetic heart valve: Secondary | ICD-10-CM | POA: Diagnosis not present

## 2020-06-29 LAB — ECHOCARDIOGRAM COMPLETE
AV Mean grad: 16.6 mmHg
AV Peak grad: 26.8 mmHg
Ao pk vel: 2.59 m/s
Area-P 1/2: 1.9 cm2
S' Lateral: 2.6 cm

## 2020-06-30 ENCOUNTER — Ambulatory Visit (INDEPENDENT_AMBULATORY_CARE_PROVIDER_SITE_OTHER): Payer: Medicare Other | Admitting: *Deleted

## 2020-06-30 DIAGNOSIS — Z952 Presence of prosthetic heart valve: Secondary | ICD-10-CM | POA: Diagnosis not present

## 2020-06-30 DIAGNOSIS — Z5181 Encounter for therapeutic drug level monitoring: Secondary | ICD-10-CM

## 2020-06-30 LAB — POCT INR: INR: 3 (ref 2.0–3.0)

## 2020-06-30 NOTE — Patient Instructions (Signed)
Decrease warfarin to 2 tablets daily except 1 tablet on Mondays and Thursdays Recheck in 3 weeks

## 2020-07-01 ENCOUNTER — Telehealth: Payer: Self-pay | Admitting: Cardiovascular Disease

## 2020-07-01 NOTE — Telephone Encounter (Signed)
Patient made aware of results and verbalized understanding.  Normal function of both the artificial heart valves.  Borderline low overall heart pumping function, not changed from 2017 echo in my opinion.

## 2020-07-01 NOTE — Telephone Encounter (Signed)
Follow Up:     Pt is returning your call, concerning her Echo results.

## 2020-07-05 ENCOUNTER — Other Ambulatory Visit: Payer: Self-pay | Admitting: Cardiovascular Disease

## 2020-07-05 DIAGNOSIS — E7849 Other hyperlipidemia: Secondary | ICD-10-CM | POA: Diagnosis not present

## 2020-07-05 DIAGNOSIS — N183 Chronic kidney disease, stage 3 unspecified: Secondary | ICD-10-CM | POA: Diagnosis not present

## 2020-07-05 DIAGNOSIS — I129 Hypertensive chronic kidney disease with stage 1 through stage 4 chronic kidney disease, or unspecified chronic kidney disease: Secondary | ICD-10-CM | POA: Diagnosis not present

## 2020-07-05 DIAGNOSIS — I482 Chronic atrial fibrillation, unspecified: Secondary | ICD-10-CM | POA: Diagnosis not present

## 2020-07-22 ENCOUNTER — Other Ambulatory Visit: Payer: Self-pay

## 2020-07-22 ENCOUNTER — Ambulatory Visit (INDEPENDENT_AMBULATORY_CARE_PROVIDER_SITE_OTHER): Payer: Medicare Other | Admitting: *Deleted

## 2020-07-22 DIAGNOSIS — Z952 Presence of prosthetic heart valve: Secondary | ICD-10-CM | POA: Diagnosis not present

## 2020-07-22 DIAGNOSIS — Z5181 Encounter for therapeutic drug level monitoring: Secondary | ICD-10-CM | POA: Diagnosis not present

## 2020-07-22 LAB — POCT INR: INR: 2.5 (ref 2.0–3.0)

## 2020-08-04 DIAGNOSIS — I129 Hypertensive chronic kidney disease with stage 1 through stage 4 chronic kidney disease, or unspecified chronic kidney disease: Secondary | ICD-10-CM | POA: Diagnosis not present

## 2020-08-04 DIAGNOSIS — E7849 Other hyperlipidemia: Secondary | ICD-10-CM | POA: Diagnosis not present

## 2020-08-04 DIAGNOSIS — N183 Chronic kidney disease, stage 3 unspecified: Secondary | ICD-10-CM | POA: Diagnosis not present

## 2020-08-04 DIAGNOSIS — I482 Chronic atrial fibrillation, unspecified: Secondary | ICD-10-CM | POA: Diagnosis not present

## 2020-08-08 DIAGNOSIS — Z23 Encounter for immunization: Secondary | ICD-10-CM | POA: Diagnosis not present

## 2020-08-18 ENCOUNTER — Other Ambulatory Visit: Payer: Self-pay

## 2020-08-18 ENCOUNTER — Ambulatory Visit (INDEPENDENT_AMBULATORY_CARE_PROVIDER_SITE_OTHER): Payer: Medicare Other | Admitting: *Deleted

## 2020-08-18 DIAGNOSIS — Z5181 Encounter for therapeutic drug level monitoring: Secondary | ICD-10-CM | POA: Diagnosis not present

## 2020-08-18 DIAGNOSIS — I4891 Unspecified atrial fibrillation: Secondary | ICD-10-CM | POA: Diagnosis not present

## 2020-08-18 DIAGNOSIS — Z952 Presence of prosthetic heart valve: Secondary | ICD-10-CM | POA: Diagnosis not present

## 2020-08-18 LAB — POCT INR: INR: 1.6 — AB (ref 2.0–3.0)

## 2020-08-18 NOTE — Patient Instructions (Signed)
Take warfarin 2 tablets tonight then resume 2 tablets daily except 1 tablet on Mondays and Thursdays Recheck in 3 weeks

## 2020-09-08 ENCOUNTER — Other Ambulatory Visit: Payer: Self-pay

## 2020-09-08 ENCOUNTER — Other Ambulatory Visit: Payer: Self-pay | Admitting: Cardiovascular Disease

## 2020-09-08 ENCOUNTER — Ambulatory Visit (INDEPENDENT_AMBULATORY_CARE_PROVIDER_SITE_OTHER): Payer: Medicare Other | Admitting: *Deleted

## 2020-09-08 DIAGNOSIS — Z5181 Encounter for therapeutic drug level monitoring: Secondary | ICD-10-CM | POA: Diagnosis not present

## 2020-09-08 DIAGNOSIS — Z952 Presence of prosthetic heart valve: Secondary | ICD-10-CM | POA: Diagnosis not present

## 2020-09-08 DIAGNOSIS — I4891 Unspecified atrial fibrillation: Secondary | ICD-10-CM | POA: Diagnosis not present

## 2020-09-08 LAB — POCT INR: INR: 2.5 (ref 2.0–3.0)

## 2020-09-08 NOTE — Patient Instructions (Signed)
Continue warfarin 2 tablets daily except 1 tablet on Mondays and Thursdays Recheck in 4 weeks 

## 2020-09-20 DIAGNOSIS — K219 Gastro-esophageal reflux disease without esophagitis: Secondary | ICD-10-CM | POA: Diagnosis not present

## 2020-09-20 DIAGNOSIS — D519 Vitamin B12 deficiency anemia, unspecified: Secondary | ICD-10-CM | POA: Diagnosis not present

## 2020-09-20 DIAGNOSIS — I10 Essential (primary) hypertension: Secondary | ICD-10-CM | POA: Diagnosis not present

## 2020-09-20 DIAGNOSIS — N183 Chronic kidney disease, stage 3 unspecified: Secondary | ICD-10-CM | POA: Diagnosis not present

## 2020-09-20 DIAGNOSIS — R945 Abnormal results of liver function studies: Secondary | ICD-10-CM | POA: Diagnosis not present

## 2020-09-27 ENCOUNTER — Other Ambulatory Visit: Payer: Self-pay | Admitting: Cardiovascular Disease

## 2020-10-03 DIAGNOSIS — I700 Atherosclerosis of aorta: Secondary | ICD-10-CM | POA: Diagnosis not present

## 2020-10-03 DIAGNOSIS — Z6824 Body mass index (BMI) 24.0-24.9, adult: Secondary | ICD-10-CM | POA: Diagnosis not present

## 2020-10-03 DIAGNOSIS — Z952 Presence of prosthetic heart valve: Secondary | ICD-10-CM | POA: Diagnosis not present

## 2020-10-03 DIAGNOSIS — I10 Essential (primary) hypertension: Secondary | ICD-10-CM | POA: Diagnosis not present

## 2020-10-03 DIAGNOSIS — Z950 Presence of cardiac pacemaker: Secondary | ICD-10-CM | POA: Diagnosis not present

## 2020-10-04 DIAGNOSIS — E7849 Other hyperlipidemia: Secondary | ICD-10-CM | POA: Diagnosis not present

## 2020-10-04 DIAGNOSIS — I129 Hypertensive chronic kidney disease with stage 1 through stage 4 chronic kidney disease, or unspecified chronic kidney disease: Secondary | ICD-10-CM | POA: Diagnosis not present

## 2020-10-04 DIAGNOSIS — N183 Chronic kidney disease, stage 3 unspecified: Secondary | ICD-10-CM | POA: Diagnosis not present

## 2020-10-06 ENCOUNTER — Ambulatory Visit (INDEPENDENT_AMBULATORY_CARE_PROVIDER_SITE_OTHER): Payer: Medicare Other | Admitting: *Deleted

## 2020-10-06 DIAGNOSIS — I4891 Unspecified atrial fibrillation: Secondary | ICD-10-CM | POA: Diagnosis not present

## 2020-10-06 DIAGNOSIS — Z952 Presence of prosthetic heart valve: Secondary | ICD-10-CM | POA: Diagnosis not present

## 2020-10-06 DIAGNOSIS — Z5181 Encounter for therapeutic drug level monitoring: Secondary | ICD-10-CM

## 2020-10-06 LAB — POCT INR: INR: 2.3 (ref 2.0–3.0)

## 2020-10-06 NOTE — Patient Instructions (Signed)
Continue warfarin 2 tablets daily except 1 tablet on Mondays and Thursdays Recheck in 5 weeks

## 2020-11-04 DIAGNOSIS — I129 Hypertensive chronic kidney disease with stage 1 through stage 4 chronic kidney disease, or unspecified chronic kidney disease: Secondary | ICD-10-CM | POA: Diagnosis not present

## 2020-11-04 DIAGNOSIS — N183 Chronic kidney disease, stage 3 unspecified: Secondary | ICD-10-CM | POA: Diagnosis not present

## 2020-11-04 DIAGNOSIS — E7849 Other hyperlipidemia: Secondary | ICD-10-CM | POA: Diagnosis not present

## 2020-11-04 DIAGNOSIS — I482 Chronic atrial fibrillation, unspecified: Secondary | ICD-10-CM | POA: Diagnosis not present

## 2020-11-10 ENCOUNTER — Ambulatory Visit (INDEPENDENT_AMBULATORY_CARE_PROVIDER_SITE_OTHER): Payer: Medicare Other | Admitting: *Deleted

## 2020-11-10 DIAGNOSIS — I4891 Unspecified atrial fibrillation: Secondary | ICD-10-CM

## 2020-11-10 DIAGNOSIS — Z5181 Encounter for therapeutic drug level monitoring: Secondary | ICD-10-CM | POA: Diagnosis not present

## 2020-11-10 DIAGNOSIS — Z952 Presence of prosthetic heart valve: Secondary | ICD-10-CM

## 2020-11-10 LAB — POCT INR: INR: 1.5 — AB (ref 2.0–3.0)

## 2020-11-10 NOTE — Patient Instructions (Signed)
Take warfarin 2 tablets tonight then resume 2 tablets daily except 1 tablet on Mondays and Thursdays Recheck in 3 weeks 

## 2020-11-14 DIAGNOSIS — H401131 Primary open-angle glaucoma, bilateral, mild stage: Secondary | ICD-10-CM | POA: Diagnosis not present

## 2020-12-01 ENCOUNTER — Other Ambulatory Visit: Payer: Self-pay

## 2020-12-01 ENCOUNTER — Ambulatory Visit (INDEPENDENT_AMBULATORY_CARE_PROVIDER_SITE_OTHER): Payer: Medicare Other | Admitting: *Deleted

## 2020-12-01 DIAGNOSIS — Z5181 Encounter for therapeutic drug level monitoring: Secondary | ICD-10-CM

## 2020-12-01 DIAGNOSIS — Z952 Presence of prosthetic heart valve: Secondary | ICD-10-CM | POA: Diagnosis not present

## 2020-12-01 LAB — POCT INR: INR: 2 (ref 2.0–3.0)

## 2020-12-01 NOTE — Patient Instructions (Signed)
Continue warfarin 2 tablets daily except 1 tablet on Mondays and Thursdays Recheck in 4 weeks 

## 2020-12-03 DIAGNOSIS — I482 Chronic atrial fibrillation, unspecified: Secondary | ICD-10-CM | POA: Diagnosis not present

## 2020-12-03 DIAGNOSIS — N183 Chronic kidney disease, stage 3 unspecified: Secondary | ICD-10-CM | POA: Diagnosis not present

## 2020-12-03 DIAGNOSIS — E7849 Other hyperlipidemia: Secondary | ICD-10-CM | POA: Diagnosis not present

## 2020-12-03 DIAGNOSIS — I129 Hypertensive chronic kidney disease with stage 1 through stage 4 chronic kidney disease, or unspecified chronic kidney disease: Secondary | ICD-10-CM | POA: Diagnosis not present

## 2020-12-21 ENCOUNTER — Telehealth: Payer: Self-pay | Admitting: Cardiovascular Disease

## 2020-12-21 NOTE — Telephone Encounter (Signed)
Please give pt a call-- she's trying to send in her transmission   [phone]  She said she's been trying to get in touch with someone since yesterday and didn't know the number

## 2020-12-22 NOTE — Telephone Encounter (Signed)
Called patient back to assist in sending a transmission. Unable to leave a VM

## 2020-12-27 NOTE — Telephone Encounter (Signed)
Attempted to call patient again this time leaving a VM for patient to call back, left DC number

## 2020-12-27 NOTE — Telephone Encounter (Signed)
Transmission was received.

## 2020-12-28 ENCOUNTER — Ambulatory Visit (INDEPENDENT_AMBULATORY_CARE_PROVIDER_SITE_OTHER): Payer: Medicare Other

## 2020-12-28 DIAGNOSIS — I4821 Permanent atrial fibrillation: Secondary | ICD-10-CM

## 2020-12-29 ENCOUNTER — Ambulatory Visit (INDEPENDENT_AMBULATORY_CARE_PROVIDER_SITE_OTHER): Payer: Medicare Other | Admitting: *Deleted

## 2020-12-29 DIAGNOSIS — I4891 Unspecified atrial fibrillation: Secondary | ICD-10-CM | POA: Diagnosis not present

## 2020-12-29 DIAGNOSIS — E7849 Other hyperlipidemia: Secondary | ICD-10-CM | POA: Diagnosis not present

## 2020-12-29 DIAGNOSIS — I10 Essential (primary) hypertension: Secondary | ICD-10-CM | POA: Diagnosis not present

## 2020-12-29 DIAGNOSIS — Z952 Presence of prosthetic heart valve: Secondary | ICD-10-CM | POA: Diagnosis not present

## 2020-12-29 DIAGNOSIS — R7989 Other specified abnormal findings of blood chemistry: Secondary | ICD-10-CM | POA: Diagnosis not present

## 2020-12-29 DIAGNOSIS — Z5181 Encounter for therapeutic drug level monitoring: Secondary | ICD-10-CM

## 2020-12-29 DIAGNOSIS — N183 Chronic kidney disease, stage 3 unspecified: Secondary | ICD-10-CM | POA: Diagnosis not present

## 2020-12-29 DIAGNOSIS — E782 Mixed hyperlipidemia: Secondary | ICD-10-CM | POA: Diagnosis not present

## 2020-12-29 LAB — CUP PACEART REMOTE DEVICE CHECK
Battery Impedance: 1750 Ohm
Battery Remaining Longevity: 41 mo
Battery Voltage: 2.76 V
Brady Statistic RV Percent Paced: 99 %
Date Time Interrogation Session: 20220222130101
Implantable Lead Implant Date: 20110701
Implantable Lead Implant Date: 20110701
Implantable Lead Location: 753859
Implantable Lead Location: 753860
Implantable Lead Model: 5076
Implantable Lead Model: 5076
Implantable Pulse Generator Implant Date: 20110701
Lead Channel Impedance Value: 495 Ohm
Lead Channel Impedance Value: 67 Ohm
Lead Channel Pacing Threshold Amplitude: 0.75 V
Lead Channel Pacing Threshold Pulse Width: 0.4 ms
Lead Channel Setting Pacing Amplitude: 2.5 V
Lead Channel Setting Pacing Pulse Width: 0.4 ms
Lead Channel Setting Sensing Sensitivity: 2 mV

## 2020-12-29 LAB — POCT INR: INR: 1.8 — AB (ref 2.0–3.0)

## 2020-12-29 NOTE — Patient Instructions (Signed)
Take warfarin 2 tablets today then resume 2 tablets daily except 1 tablet on Mondays and Thursdays Recheck in 3 weeks

## 2021-01-02 DIAGNOSIS — I4821 Permanent atrial fibrillation: Secondary | ICD-10-CM | POA: Diagnosis not present

## 2021-01-02 DIAGNOSIS — Z952 Presence of prosthetic heart valve: Secondary | ICD-10-CM | POA: Diagnosis not present

## 2021-01-02 DIAGNOSIS — Z0001 Encounter for general adult medical examination with abnormal findings: Secondary | ICD-10-CM | POA: Diagnosis not present

## 2021-01-02 DIAGNOSIS — I10 Essential (primary) hypertension: Secondary | ICD-10-CM | POA: Diagnosis not present

## 2021-01-02 DIAGNOSIS — M65332 Trigger finger, left middle finger: Secondary | ICD-10-CM | POA: Diagnosis not present

## 2021-01-02 DIAGNOSIS — I700 Atherosclerosis of aorta: Secondary | ICD-10-CM | POA: Diagnosis not present

## 2021-01-04 NOTE — Progress Notes (Signed)
Remote pacemaker transmission.   

## 2021-01-19 ENCOUNTER — Ambulatory Visit (INDEPENDENT_AMBULATORY_CARE_PROVIDER_SITE_OTHER): Payer: Medicare Other | Admitting: *Deleted

## 2021-01-19 DIAGNOSIS — Z952 Presence of prosthetic heart valve: Secondary | ICD-10-CM

## 2021-01-19 DIAGNOSIS — Z5181 Encounter for therapeutic drug level monitoring: Secondary | ICD-10-CM | POA: Diagnosis not present

## 2021-01-19 LAB — POCT INR: INR: 1.7 — AB (ref 2.0–3.0)

## 2021-01-19 NOTE — Patient Instructions (Signed)
Increase warfarin to 2 tablets daily except 1 tablet on Mondays  Recheck in 3 weeks

## 2021-01-24 ENCOUNTER — Other Ambulatory Visit: Payer: Self-pay | Admitting: Cardiovascular Disease

## 2021-01-24 DIAGNOSIS — I4891 Unspecified atrial fibrillation: Secondary | ICD-10-CM

## 2021-02-01 DIAGNOSIS — E7849 Other hyperlipidemia: Secondary | ICD-10-CM | POA: Diagnosis not present

## 2021-02-01 DIAGNOSIS — I482 Chronic atrial fibrillation, unspecified: Secondary | ICD-10-CM | POA: Diagnosis not present

## 2021-02-01 DIAGNOSIS — N183 Chronic kidney disease, stage 3 unspecified: Secondary | ICD-10-CM | POA: Diagnosis not present

## 2021-02-01 DIAGNOSIS — I129 Hypertensive chronic kidney disease with stage 1 through stage 4 chronic kidney disease, or unspecified chronic kidney disease: Secondary | ICD-10-CM | POA: Diagnosis not present

## 2021-02-08 ENCOUNTER — Ambulatory Visit (INDEPENDENT_AMBULATORY_CARE_PROVIDER_SITE_OTHER): Payer: Medicare Other | Admitting: *Deleted

## 2021-02-08 DIAGNOSIS — Z5181 Encounter for therapeutic drug level monitoring: Secondary | ICD-10-CM | POA: Diagnosis not present

## 2021-02-08 DIAGNOSIS — I4891 Unspecified atrial fibrillation: Secondary | ICD-10-CM | POA: Diagnosis not present

## 2021-02-08 DIAGNOSIS — Z952 Presence of prosthetic heart valve: Secondary | ICD-10-CM

## 2021-02-08 LAB — POCT INR: INR: 2 (ref 2.0–3.0)

## 2021-02-08 NOTE — Patient Instructions (Signed)
Continue warfarin 2 tablets daily except 1 tablet on Mondays  Recheck in 4 weeks

## 2021-03-04 DIAGNOSIS — I482 Chronic atrial fibrillation, unspecified: Secondary | ICD-10-CM | POA: Diagnosis not present

## 2021-03-04 DIAGNOSIS — N183 Chronic kidney disease, stage 3 unspecified: Secondary | ICD-10-CM | POA: Diagnosis not present

## 2021-03-04 DIAGNOSIS — E7849 Other hyperlipidemia: Secondary | ICD-10-CM | POA: Diagnosis not present

## 2021-03-04 DIAGNOSIS — I129 Hypertensive chronic kidney disease with stage 1 through stage 4 chronic kidney disease, or unspecified chronic kidney disease: Secondary | ICD-10-CM | POA: Diagnosis not present

## 2021-03-08 ENCOUNTER — Ambulatory Visit (INDEPENDENT_AMBULATORY_CARE_PROVIDER_SITE_OTHER): Payer: Medicare Other | Admitting: *Deleted

## 2021-03-08 DIAGNOSIS — I4891 Unspecified atrial fibrillation: Secondary | ICD-10-CM

## 2021-03-08 DIAGNOSIS — Z952 Presence of prosthetic heart valve: Secondary | ICD-10-CM

## 2021-03-08 DIAGNOSIS — Z5181 Encounter for therapeutic drug level monitoring: Secondary | ICD-10-CM | POA: Diagnosis not present

## 2021-03-08 LAB — POCT INR: INR: 1.4 — AB (ref 2.0–3.0)

## 2021-03-08 NOTE — Patient Instructions (Signed)
Take warfarin 3 tablets today and tomorrow then resume 2 tablets daily except 1 tablet on Mondays  Recheck in 3 weeks

## 2021-03-29 ENCOUNTER — Telehealth: Payer: Self-pay

## 2021-03-29 ENCOUNTER — Other Ambulatory Visit: Payer: Self-pay

## 2021-03-29 ENCOUNTER — Ambulatory Visit (INDEPENDENT_AMBULATORY_CARE_PROVIDER_SITE_OTHER): Payer: Medicare Other | Admitting: *Deleted

## 2021-03-29 DIAGNOSIS — N183 Chronic kidney disease, stage 3 unspecified: Secondary | ICD-10-CM | POA: Diagnosis not present

## 2021-03-29 DIAGNOSIS — Z952 Presence of prosthetic heart valve: Secondary | ICD-10-CM

## 2021-03-29 DIAGNOSIS — E039 Hypothyroidism, unspecified: Secondary | ICD-10-CM | POA: Diagnosis not present

## 2021-03-29 DIAGNOSIS — I4891 Unspecified atrial fibrillation: Secondary | ICD-10-CM | POA: Diagnosis not present

## 2021-03-29 DIAGNOSIS — E7849 Other hyperlipidemia: Secondary | ICD-10-CM | POA: Diagnosis not present

## 2021-03-29 DIAGNOSIS — E782 Mixed hyperlipidemia: Secondary | ICD-10-CM | POA: Diagnosis not present

## 2021-03-29 DIAGNOSIS — Z5181 Encounter for therapeutic drug level monitoring: Secondary | ICD-10-CM | POA: Diagnosis not present

## 2021-03-29 DIAGNOSIS — E559 Vitamin D deficiency, unspecified: Secondary | ICD-10-CM | POA: Diagnosis not present

## 2021-03-29 DIAGNOSIS — D519 Vitamin B12 deficiency anemia, unspecified: Secondary | ICD-10-CM | POA: Diagnosis not present

## 2021-03-29 LAB — POCT INR: INR: 2.5 (ref 2.0–3.0)

## 2021-03-29 NOTE — Patient Instructions (Signed)
Continue warfarin 2 tablets daily except 1 tablet on Mondays  Recheck in 4 weeks

## 2021-03-29 NOTE — Telephone Encounter (Signed)
Pt called to get help sending her transmission. Medtronic is sending her a new wirex for her monitor.

## 2021-04-05 ENCOUNTER — Other Ambulatory Visit: Payer: Self-pay | Admitting: Cardiovascular Disease

## 2021-04-05 DIAGNOSIS — D696 Thrombocytopenia, unspecified: Secondary | ICD-10-CM | POA: Diagnosis not present

## 2021-04-05 DIAGNOSIS — E7849 Other hyperlipidemia: Secondary | ICD-10-CM | POA: Diagnosis not present

## 2021-04-05 DIAGNOSIS — Z950 Presence of cardiac pacemaker: Secondary | ICD-10-CM | POA: Diagnosis not present

## 2021-04-05 DIAGNOSIS — I4821 Permanent atrial fibrillation: Secondary | ICD-10-CM | POA: Diagnosis not present

## 2021-04-05 DIAGNOSIS — Z952 Presence of prosthetic heart valve: Secondary | ICD-10-CM | POA: Diagnosis not present

## 2021-04-05 DIAGNOSIS — I10 Essential (primary) hypertension: Secondary | ICD-10-CM | POA: Diagnosis not present

## 2021-04-05 MED ORDER — ATORVASTATIN CALCIUM 40 MG PO TABS: 40.0000 mg | ORAL_TABLET | Freq: Every day | ORAL | 0 refills | Status: DC

## 2021-04-05 NOTE — Telephone Encounter (Signed)
Atorvastatin refilled.  

## 2021-04-26 ENCOUNTER — Ambulatory Visit: Payer: Medicare Other | Admitting: *Deleted

## 2021-04-26 ENCOUNTER — Other Ambulatory Visit: Payer: Self-pay

## 2021-04-26 DIAGNOSIS — I4891 Unspecified atrial fibrillation: Secondary | ICD-10-CM | POA: Diagnosis not present

## 2021-04-26 DIAGNOSIS — Z952 Presence of prosthetic heart valve: Secondary | ICD-10-CM

## 2021-04-26 DIAGNOSIS — Z5181 Encounter for therapeutic drug level monitoring: Secondary | ICD-10-CM

## 2021-04-26 LAB — POCT INR: INR: 1.9 — AB (ref 2.0–3.0)

## 2021-04-26 NOTE — Patient Instructions (Signed)
Take warfarin 3 tablets tonight then resume 2 tablets daily except 1 tablet on Mondays  Recheck in 4 weeks

## 2021-04-27 ENCOUNTER — Other Ambulatory Visit: Payer: Self-pay | Admitting: Cardiovascular Disease

## 2021-05-04 DIAGNOSIS — E7849 Other hyperlipidemia: Secondary | ICD-10-CM | POA: Diagnosis not present

## 2021-05-04 DIAGNOSIS — I129 Hypertensive chronic kidney disease with stage 1 through stage 4 chronic kidney disease, or unspecified chronic kidney disease: Secondary | ICD-10-CM | POA: Diagnosis not present

## 2021-05-04 DIAGNOSIS — I482 Chronic atrial fibrillation, unspecified: Secondary | ICD-10-CM | POA: Diagnosis not present

## 2021-05-04 DIAGNOSIS — N183 Chronic kidney disease, stage 3 unspecified: Secondary | ICD-10-CM | POA: Diagnosis not present

## 2021-05-16 DIAGNOSIS — H401231 Low-tension glaucoma, bilateral, mild stage: Secondary | ICD-10-CM | POA: Diagnosis not present

## 2021-05-22 ENCOUNTER — Ambulatory Visit (INDEPENDENT_AMBULATORY_CARE_PROVIDER_SITE_OTHER): Payer: Medicare Other | Admitting: *Deleted

## 2021-05-22 ENCOUNTER — Other Ambulatory Visit: Payer: Self-pay

## 2021-05-22 DIAGNOSIS — Z952 Presence of prosthetic heart valve: Secondary | ICD-10-CM | POA: Diagnosis not present

## 2021-05-22 DIAGNOSIS — Z5181 Encounter for therapeutic drug level monitoring: Secondary | ICD-10-CM | POA: Diagnosis not present

## 2021-05-22 DIAGNOSIS — I4891 Unspecified atrial fibrillation: Secondary | ICD-10-CM | POA: Diagnosis not present

## 2021-05-22 LAB — POCT INR: INR: 2.6 (ref 2.0–3.0)

## 2021-05-22 NOTE — Patient Instructions (Signed)
Take warfarin 1/2 tablet tonight then resume 2 tablets daily except 1 tablet on Mondays  Recheck in 4 weeks

## 2021-05-25 ENCOUNTER — Other Ambulatory Visit: Payer: Self-pay | Admitting: Cardiovascular Disease

## 2021-05-25 DIAGNOSIS — I4891 Unspecified atrial fibrillation: Secondary | ICD-10-CM

## 2021-05-25 NOTE — Telephone Encounter (Signed)
Prescription refill request received for warfarin Lov: 06/13/20 Dr Sallyanne Kuster Next INR check: 06/19/21 Warfarin tablet strength: 2.5mg 

## 2021-06-04 DIAGNOSIS — I482 Chronic atrial fibrillation, unspecified: Secondary | ICD-10-CM | POA: Diagnosis not present

## 2021-06-04 DIAGNOSIS — N183 Chronic kidney disease, stage 3 unspecified: Secondary | ICD-10-CM | POA: Diagnosis not present

## 2021-06-04 DIAGNOSIS — I129 Hypertensive chronic kidney disease with stage 1 through stage 4 chronic kidney disease, or unspecified chronic kidney disease: Secondary | ICD-10-CM | POA: Diagnosis not present

## 2021-06-04 DIAGNOSIS — E7849 Other hyperlipidemia: Secondary | ICD-10-CM | POA: Diagnosis not present

## 2021-06-19 ENCOUNTER — Other Ambulatory Visit: Payer: Self-pay

## 2021-06-19 ENCOUNTER — Ambulatory Visit (INDEPENDENT_AMBULATORY_CARE_PROVIDER_SITE_OTHER): Payer: Medicare Other | Admitting: *Deleted

## 2021-06-19 DIAGNOSIS — I4891 Unspecified atrial fibrillation: Secondary | ICD-10-CM | POA: Diagnosis not present

## 2021-06-19 DIAGNOSIS — Z952 Presence of prosthetic heart valve: Secondary | ICD-10-CM

## 2021-06-19 DIAGNOSIS — Z5181 Encounter for therapeutic drug level monitoring: Secondary | ICD-10-CM | POA: Diagnosis not present

## 2021-06-19 LAB — POCT INR: INR: 3.5 — AB (ref 2.0–3.0)

## 2021-06-19 NOTE — Patient Instructions (Signed)
Take warfarin 1/2 tablet tonight then decrease dose to  2 tablets daily except 1 tablet on Mondays and Thursdays Recheck in 3 weeks

## 2021-06-25 ENCOUNTER — Other Ambulatory Visit: Payer: Self-pay | Admitting: Cardiovascular Disease

## 2021-06-25 DIAGNOSIS — I4891 Unspecified atrial fibrillation: Secondary | ICD-10-CM

## 2021-06-26 ENCOUNTER — Other Ambulatory Visit: Payer: Self-pay

## 2021-06-26 ENCOUNTER — Ambulatory Visit (INDEPENDENT_AMBULATORY_CARE_PROVIDER_SITE_OTHER): Payer: Medicare Other | Admitting: Cardiovascular Disease

## 2021-06-26 ENCOUNTER — Encounter: Payer: Self-pay | Admitting: Cardiovascular Disease

## 2021-06-26 ENCOUNTER — Telehealth: Payer: Self-pay | Admitting: Cardiovascular Disease

## 2021-06-26 VITALS — BP 136/74 | HR 71 | Ht 61.0 in | Wt 123.0 lb

## 2021-06-26 DIAGNOSIS — Z952 Presence of prosthetic heart valve: Secondary | ICD-10-CM | POA: Diagnosis not present

## 2021-06-26 DIAGNOSIS — I4821 Permanent atrial fibrillation: Secondary | ICD-10-CM

## 2021-06-26 DIAGNOSIS — I10 Essential (primary) hypertension: Secondary | ICD-10-CM | POA: Diagnosis not present

## 2021-06-26 DIAGNOSIS — R296 Repeated falls: Secondary | ICD-10-CM

## 2021-06-26 DIAGNOSIS — Z950 Presence of cardiac pacemaker: Secondary | ICD-10-CM

## 2021-06-26 DIAGNOSIS — I472 Ventricular tachycardia: Secondary | ICD-10-CM | POA: Diagnosis not present

## 2021-06-26 DIAGNOSIS — I4729 Other ventricular tachycardia: Secondary | ICD-10-CM

## 2021-06-26 DIAGNOSIS — Z7901 Long term (current) use of anticoagulants: Secondary | ICD-10-CM

## 2021-06-26 NOTE — Progress Notes (Signed)
Patient ID: Alice Ballard, female   DOB: [birth year], 80 y.o.   MRN: [id number]    Cardiology Office Note    Date:  06/26/2021   ID:  Alice Ballard, Alice Ballard 1941/01/05, MRN [id number]  PCP:  Curlene Labrum, MD  Cardiologist:   Sanda Klein, MD   Chief Complaint  Patient presents with   Cardiac Valve Problem   Atrial Fibrillation   Pacemaker Check    History of Present Illness:  Alice Ballard is a 80 y.o. 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   Last appointment she has not had problems with dyspnea, angina, dizziness, syncope or edema.  She denies palpitations.  She is much less steady on her feet and often tilts towards 1 side or another when she tries to walk.  She has been using a cane, but not a walker.  She has had a few falls, thankfully none of them with serious injury and none with head impact.  She looks very unsteady walking into the clinic today.  She has lost 10 pounds since last year and about 20 pounds in the last 2 years  She has not had any overt bleeding problems on warfarin anticoagulation and her INR is usually in target range.  INR was mildly subtherapeutic at 1.9 in late June, mildly supratherapeutic today at 3.5  Pacemaker interrogation shows normal device function.  Underlying rhythm is atrial fibrillation at about 50 bpm, but she has 99.1 % ventricular pacing.  Estimated pacemaker longevity is roughly 3 years.    As before, she has occasional episodes of high ventricular rates, 2 or 3 times a month.  Some of these might be RVR, but most of them appear to be true nonsustained VT.  The episodes generally last for-9 seconds.  The fastest was 240 bpm for 8 beats.  These episodes are always asymptomatic they do not appear to have any correlation with her stumbling or falls.  Alice Ballard is a  80 year old woman with a mechanical aortic and mitral valve prostheses as well as long-term persistent atrial fibrillation, hypertension, dyslipidemia. In 1986 her mitral valve was replaced with a 29 mm St. Jude prosthesis for rheumatic disease. In 1994 her aortic valve was replaced with a 19 mm St. Jude device. Has chronic moderate elevation in the valvular gradients across the aortic valve, likely an expression of patient-valve mismatch. Fluoroscopy has shown normal disc motion. Previous right heart catheterization had shown only very mild pulmonary hypertension (mean PA pressure 25 mm Hg, wedge pressure 16 mm Hg) She has no history of coronary artery disease. She has a dual-chamber permanent pacemaker (Medtronic) that was implanted in 2011 for tachycardia-bradycardia syndrome, now programmed VVIR, virtually 100% V paced. She is on chronic warfarin anticoagulation both for her mechanical valves and for atrial fibrillation. She has not had serious bleeding complications or embolic events. She does not have a history of stroke.  Past Medical History:  Diagnosis Date   Abnormality of gait 07/04/2016   Cerebral vascular disease    Coronary artery disease    Excessive daytime sleepiness 07/04/2016   Fatty liver    Gait abnormality    Gastric polyp    GERD (gastroesophageal reflux disease)    Hemorrhoids    Hiatal hernia    Hyperlipidemia    Hypersomnolence    Hypertension    Pacemaker july 2011  medtronic adapta model # ADDRL 1,SERIAL [id number]   Paroxysmal atrial fibrillation (HCC)    Seizure disorder ([place])    Sick sinus syndrome ([place])    Spinal stenosis    Tubular adenoma of colon 11/2010   UTI (urinary tract infection)     Past Surgical History:  Procedure Laterality Date   AORTIC AND MITRAL VALVE REPLACEMENT  1994   67m St. Jude   BACK SURGERY     CARDIAC CATHETERIZATION  04/2010   DOPPLER ECHOCARDIOGRAPHY  sept 06, 2013   aotric valve grad. peak 51 mmHg and mean 28 mm Hg  dimensional  subtractive index 0.26; mitral valve grad peak 25 and mean 752mHg norm pressure halftime 4427m.   MITRAL VALVE REPLACEMENT  1986   St Jude prosthesis    NM MYOCAR PERF WALL MOTION  03/31/2008   EF 69% low risk   NM MYOCAR PERF WALL MOTION  04/19/2006   EF 69%   PACEMAKER PLACEMENT  05/2010   TEE WITHOUT CARDIOVERSION  07/11/2012   Procedure: TRANSESOPHAGEAL ECHOCARDIOGRAM (TEE);  Surgeon: MihSanda KleinD;  [facility];  Service: Cardiovascular;  Laterality: N/A;    Outpatient Medications Prior to Visit  Medication Sig Dispense Refill   acetaminophen (TYLENOL) 500 MG tablet Take 1,000 mg by mouth every 6 (six) hours as needed. For pain     alendronate (FOSAMAX) 70 MG tablet Take 70 mg by mouth every 7 (seven) days. Take with a full glass of water on an empty stomach. Takes on Sunday.     Ascorbic Acid (VITAMIN C) 1000 MG tablet Take 1,000 mg by mouth daily.     atorvastatin (LIPITOR) 40 MG tablet Take 1 tablet (40 mg total) by mouth daily. <PLEASE MAKE APPOINTMENT FOR REFILLS> 90 tablet 0   chlorthalidone (HYGROTON) 25 MG tablet Take 0.5 tablets (12.5 mg total) by mouth daily. 45 tablet 3   Cholecalciferol (VITAMIN D3) 2000 UNITS capsule Take 2,000 Units by mouth daily.     dorzolamide (TRUSOPT) 2 % ophthalmic solution Place 1 drop into the right eye 2 (two) times daily.     fluticasone (FLONASE) 50 MCG/ACT nasal spray Place 2 sprays into both nostrils 2 (two) times daily as needed.      folic acid (FOLVITE) 1 MG tablet TAKE ONE TABLET BY MOUTH EVERY DAY 90 tablet 0   latanoprost (XALATAN) 0.005 % ophthalmic solution Place 1 drop into both eyes at bedtime.     levETIRAcetam (KEPPRA) 500 MG tablet Take 500 mg by mouth 2 (two) times daily.     lisinopril (ZESTRIL) 30 MG tablet TAKE 1 TABLET BY MOUTH EVERY DAY 90 tablet 0   omeprazole (PRILOSEC) 20 MG capsule Take 20 mg by mouth daily as needed.      Polyethyl Glycol-Propyl Glycol (SYSTANE OP) Place 1 drop into both eyes daily as  needed. For dry eyes     vitamin B-12 (CYANOCOBALAMIN) 1000 MCG tablet Take 1,000 mcg by mouth daily.     warfarin (COUMADIN) 2.5 MG tablet TAKE 1 TO 2 TABLETS BY MOUTH DAILY AS DIRECTED by coumadin clinic 60 tablet 0   No facility-administered medications prior to visit.     Allergies:   Shellfish allergy and Tegretol [carbamazepine]   Social History   Socioeconomic History   Marital status: Married    Spouse name: Not on file   Number of children: 1   Years of education: Not on file   Highest education level: Not on file  Occupational History  Occupation: DISABLED  Tobacco Use   Smoking status: Never   Smokeless tobacco: Never  Substance and Sexual Activity   Alcohol use: No   Drug use: No   Sexual activity: Not on file  Other Topics Concern   Not on file  Social History Narrative   Lives at home with her husband.   Right-handed.   16-20oz of caffeine per day.   Social Determinants of Health   Financial Resource Strain: Not on file  Food Insecurity: Not on file  Transportation Needs: Not on file  Physical Activity: Not on file  Stress: Not on file  Social Connections: Not on file     Family History:  The patient's family history includes Colon cancer in her sister; Colon polyps in her sister; Heart disease in her mother.   ROS:   Please see the history of present illness.    ROS All other systems are reviewed and are negative.   PHYSICAL EXAM:   VS:  BP 136/74   Pulse 71   Ht '5\' 1"'$  (1.549 m)   Wt 123 lb (55.8 kg)   SpO2 98%   BMI 23.24 kg/m      General: Alert, oriented x3, no distress, of the pacemaker site Head: no evidence of trauma, PERRL, EOMI, no exophtalmos or lid lag, no myxedema, no xanthelasma; normal ears, nose and oropharynx Neck: normal jugular venous pulsations and no hepatojugular reflux; brisk carotid pulses without delay and no carotid bruits Chest: clear to auscultation, no signs of consolidation by percussion or palpation, normal  fremitus, symmetrical and full respiratory excursions Cardiovascular: normal position and quality of the apical impulse, regular rhythm, crisp mechanical valve sounds, 1-2/6 aortic ejection murmur is early peaking, no diastolic murmurs, rubs or gallops Abdomen: no tenderness or distention, no masses by palpation, no abnormal pulsatility or arterial bruits, normal bowel sounds, no hepatosplenomegaly Extremities: no clubbing, cyanosis or edema; 2+ radial, ulnar and brachial pulses bilaterally; 2+ right femoral, posterior tibial and dorsalis pedis pulses; 2+ left femoral, posterior tibial and dorsalis pedis pulses; no subclavian or femoral bruits Neurological: grossly nonfocal Psych: Normal mood and affect  Wt Readings from Last 3 Encounters:  06/26/21 123 lb (55.8 kg)  06/13/20 134 lb 9.6 oz (61.1 kg)  06/08/19 141 lb 3.2 oz (64 kg)      Studies/Labs Reviewed:   EKG:  EKG is ordered today.  Personally reviewed, shows atrial fibrillation and 100% ventricular paced rhythm at 70 bpm  Recent Labs: December 29, 2020 Cholesterol 136, HDL 57, LDL 62, triglycerides 87  Mar 29, 2021 Hemoglobin 12.0, creatinine 0.78, ALT 14, TSH 3.19  ASSESSMENT:    1. Permanent atrial fibrillation ([place])   2. Pacemaker: Medtronic 2011 for tachy brady AFIB   3. NSVT (nonsustained ventricular tachycardia) ([place])   4. Mitral valve replaced   5. Aortic valve replaced   6. Long term current use of anticoagulant therapy   7. Essential hypertension   8. Falls frequently      PLAN:  In order of problems listed above:  Permanent atrial fibrillation: Virtually 100% ventricular paced due to underlying slow AV conduction, even in the absence of any rate control medications.  She has a very high embolic risk due to the presence of 2 mechanical prostheses.  Anticoagulation should only be interrupted for important surgical procedures or critical bleeding complications.  Should be bridged with short acting agents such as  enoxaparin or intravenous heparin as appropriate. PPM: He is not pacemaker dependent.  Continue  remote downloads every 3 months. NSVT: Has episodes of asymptomatic and brief nonsustained VT 2 or 3 times a month for the last several years.  The pattern has not really changed. Mechanical AVR: She has always had slightly elevated gradients across the aortic valve, may be due to patient valve mismatch, but these have been very stable (most recent mean gradient 17 mmHg).  She does not have any symptoms of aortic valve stenosis.  T is time to reevaluate the gradients across the aortic valve with an echocardiogram.  She has always had slightly elevated gradients consistent with moderate patient valve mismatch.  She does not have any symptoms of aortic valve stenosis.  Most recent dimensionless index was calculated at 0.30, consistent with moderate obstruction.  She is aware of the need for endocarditis prophylaxis. Mechanical MVR: Mean gradient 4.0 mmHg by most recent echo.  Reevaluate next year. Warfarin: Mildly supratherapeutic recently.  No active bleeding. HTN: Well-controlled. Falls: I am very concerned about the risk for serious even devastating bleeding complications if she falls.  I have recommended that she use a walker rather than a cane.  Consider physical therapy evaluation.  She has been losing weight at a slow pace over the last couple of years, about a pound a month.  She is relatively sedentary.   Medication Adjustments/Labs and Tests Ordered: Current medicines are reviewed at length with the patient today.  Concerns regarding medicines are outlined above.  Medication changes, Labs and Tests ordered today are listed in the Patient Instructions below. Patient Instructions  Medication Instructions:  No changes *If you need a refill on your cardiac medications before your next appointment, please call your pharmacy*   Lab Work: None ordered If you have labs (blood work) drawn today and your  tests are completely normal, you will receive your results only by: [place] (if you have MyChart) OR A paper copy in the mail If you have any lab test that is abnormal or we need to change your treatment, we will call you to review the results.   Testing/Procedures: Your physician has requested that you have an echocardiogram in 12 months. Echocardiography is a painless test that uses sound waves to create images of your heart. It provides your doctor with information about the size and shape of your heart and how well your heart's chambers and valves are working. You may receive an ultrasound enhancing agent through an IV if needed to better visualize your heart during the echo.This procedure takes approximately one hour. There are no restrictions for this procedure. This will take place at the [address], [address].     Follow-Up: At [facility], you and your health needs are our priority.  As part of our continuing mission to provide you with exceptional heart care, we have created designated Provider Care Teams.  These Care Teams include your primary Cardiologist (physician) and Advanced Practice Providers (APPs -  Physician Assistants and Nurse Practitioners) who all work together to provide you with the care you need, when you need it.  We recommend signing up for the patient portal called "MyChart".  Sign up information is provided on this After Visit Summary.  MyChart is used to connect with patients for Virtual Visits (Telemedicine).  Patients are able to view lab/test results, encounter notes, upcoming appointments, etc.  Non-urgent messages can be sent to your provider as well.   To learn more about what you can do with MyChart, go to [email or web address].  Your next appointment:   12 month(s)  The format for your next appointment:   In Person  Provider:   Sanda Klein, MD      Signed, Sanda Klein, MD  06/26/2021 5:56 PM    [address], [place], [place]  [address] Phone: [phone]; Fax: [phone]

## 2021-06-26 NOTE — Patient Instructions (Signed)
Medication Instructions:  No changes *If you need a refill on your cardiac medications before your next appointment, please call your pharmacy*   Lab Work: None ordered If you have labs (blood work) drawn today and your tests are completely normal, you will receive your results only by: [place] (if you have MyChart) OR A paper copy in the mail If you have any lab test that is abnormal or we need to change your treatment, we will call you to review the results.   Testing/Procedures: Your physician has requested that you have an echocardiogram in 12 months. Echocardiography is a painless test that uses sound waves to create images of your heart. It provides your doctor with information about the size and shape of your heart and how well your heart's chambers and valves are working. You may receive an ultrasound enhancing agent through an IV if needed to better visualize your heart during the echo.This procedure takes approximately one hour. There are no restrictions for this procedure. This will take place at the [address].     Follow-Up: At [facility], you and your health needs are our priority.  As part of our continuing mission to provide you with exceptional heart care, we have created designated Provider Care Teams.  These Care Teams include your primary Cardiologist (physician) and Advanced Practice Providers (APPs -  Physician Assistants and Nurse Practitioners) who all work together to provide you with the care you need, when you need it.  We recommend signing up for the patient portal called "MyChart".  Sign up information is provided on this After Visit Summary.  MyChart is used to connect with patients for Virtual Visits (Telemedicine).  Patients are able to view lab/test results, encounter notes, upcoming appointments, etc.  Non-urgent messages can be sent to your provider as well.   To learn more about what you can do with MyChart, go to  [email or web address].    Your next appointment:   12 month(s)  The format for your next appointment:   In Person  Provider:   Sanda Klein, MD

## 2021-06-26 NOTE — Telephone Encounter (Signed)
Spoke to the patient's husband. He wanted to know if Dr. Sallyanne Kuster could write a prescription for a walker for the patient.

## 2021-06-26 NOTE — Telephone Encounter (Signed)
Pt was seen by Dr. Loletha Grayer today and was asked to get a walker or Kasandra Knudsen, pt prefers the [employer]. The company needs a script from Dr. Loletha Grayer to be faxed to   [facility] [address] [place], [place] [address] [phone] number  [phone] number

## 2021-06-27 NOTE — Telephone Encounter (Addendum)
Left a message for the patient that we can order the walker for her. We will fax the order next week when back in the office.

## 2021-06-28 ENCOUNTER — Ambulatory Visit (INDEPENDENT_AMBULATORY_CARE_PROVIDER_SITE_OTHER): Payer: Medicare Other

## 2021-06-28 DIAGNOSIS — I4729 Other ventricular tachycardia: Secondary | ICD-10-CM

## 2021-06-28 DIAGNOSIS — I472 Ventricular tachycardia: Secondary | ICD-10-CM

## 2021-06-29 LAB — CUP PACEART REMOTE DEVICE CHECK
Battery Impedance: 1926 Ohm
Battery Remaining Longevity: 38 mo
Battery Voltage: 2.75 V
Brady Statistic RV Percent Paced: 98 %
Date Time Interrogation Session: 20220824141812
Implantable Lead Implant Date: 20110701
Implantable Lead Implant Date: 20110701
Implantable Lead Location: 753859
Implantable Lead Location: 753860
Implantable Lead Model: 5076
Implantable Lead Model: 5076
Implantable Pulse Generator Implant Date: 20110701
Lead Channel Impedance Value: 490 Ohm
Lead Channel Impedance Value: 67 Ohm
Lead Channel Pacing Threshold Amplitude: 0.625 V
Lead Channel Pacing Threshold Pulse Width: 0.4 ms
Lead Channel Setting Pacing Amplitude: 2.5 V
Lead Channel Setting Pacing Pulse Width: 0.4 ms
Lead Channel Setting Sensing Sensitivity: 2.8 mV

## 2021-07-05 ENCOUNTER — Other Ambulatory Visit: Payer: Self-pay | Admitting: Cardiovascular Disease

## 2021-07-05 DIAGNOSIS — E7849 Other hyperlipidemia: Secondary | ICD-10-CM | POA: Diagnosis not present

## 2021-07-05 DIAGNOSIS — I482 Chronic atrial fibrillation, unspecified: Secondary | ICD-10-CM | POA: Diagnosis not present

## 2021-07-05 DIAGNOSIS — N183 Chronic kidney disease, stage 3 unspecified: Secondary | ICD-10-CM | POA: Diagnosis not present

## 2021-07-05 DIAGNOSIS — I129 Hypertensive chronic kidney disease with stage 1 through stage 4 chronic kidney disease, or unspecified chronic kidney disease: Secondary | ICD-10-CM | POA: Diagnosis not present

## 2021-07-12 ENCOUNTER — Ambulatory Visit (INDEPENDENT_AMBULATORY_CARE_PROVIDER_SITE_OTHER): Payer: Medicare Other | Admitting: *Deleted

## 2021-07-12 DIAGNOSIS — Z952 Presence of prosthetic heart valve: Secondary | ICD-10-CM | POA: Diagnosis not present

## 2021-07-12 DIAGNOSIS — Z5181 Encounter for therapeutic drug level monitoring: Secondary | ICD-10-CM

## 2021-07-12 DIAGNOSIS — I4891 Unspecified atrial fibrillation: Secondary | ICD-10-CM

## 2021-07-12 LAB — POCT INR: INR: 2.4 (ref 2.0–3.0)

## 2021-07-12 NOTE — Patient Instructions (Signed)
Continue warfarin 2 tablets daily except 1 tablet on Mondays and Thursdays Recheck in 4 weeks 

## 2021-07-13 NOTE — Progress Notes (Signed)
Remote pacemaker transmission.   

## 2021-07-18 DIAGNOSIS — I10 Essential (primary) hypertension: Secondary | ICD-10-CM | POA: Diagnosis not present

## 2021-07-18 DIAGNOSIS — D696 Thrombocytopenia, unspecified: Secondary | ICD-10-CM | POA: Diagnosis not present

## 2021-07-18 DIAGNOSIS — K529 Noninfective gastroenteritis and colitis, unspecified: Secondary | ICD-10-CM | POA: Diagnosis not present

## 2021-07-18 DIAGNOSIS — B86 Scabies: Secondary | ICD-10-CM | POA: Diagnosis not present

## 2021-07-18 DIAGNOSIS — I4821 Permanent atrial fibrillation: Secondary | ICD-10-CM | POA: Diagnosis not present

## 2021-07-18 DIAGNOSIS — Z952 Presence of prosthetic heart valve: Secondary | ICD-10-CM | POA: Diagnosis not present

## 2021-07-24 ENCOUNTER — Other Ambulatory Visit: Payer: Self-pay | Admitting: Cardiovascular Disease

## 2021-07-24 DIAGNOSIS — I4891 Unspecified atrial fibrillation: Secondary | ICD-10-CM

## 2021-07-24 DIAGNOSIS — K529 Noninfective gastroenteritis and colitis, unspecified: Secondary | ICD-10-CM | POA: Diagnosis not present

## 2021-07-24 DIAGNOSIS — B86 Scabies: Secondary | ICD-10-CM | POA: Diagnosis not present

## 2021-07-28 DIAGNOSIS — R197 Diarrhea, unspecified: Secondary | ICD-10-CM | POA: Diagnosis not present

## 2021-08-03 ENCOUNTER — Other Ambulatory Visit: Payer: Self-pay | Admitting: Cardiovascular Disease

## 2021-08-09 ENCOUNTER — Ambulatory Visit: Payer: Medicare Other | Admitting: *Deleted

## 2021-08-09 DIAGNOSIS — Z952 Presence of prosthetic heart valve: Secondary | ICD-10-CM

## 2021-08-09 DIAGNOSIS — Z5181 Encounter for therapeutic drug level monitoring: Secondary | ICD-10-CM

## 2021-08-09 DIAGNOSIS — I4891 Unspecified atrial fibrillation: Secondary | ICD-10-CM

## 2021-08-09 LAB — POCT INR: INR: 3 (ref 2.0–3.0)

## 2021-08-09 NOTE — Patient Instructions (Signed)
Hold warfarin tonight then resume 2 tablets daily except 1 tablet on Mondays and Thursdays Recheck in 3 weeks

## 2021-08-23 ENCOUNTER — Other Ambulatory Visit: Payer: Self-pay | Admitting: Cardiovascular Disease

## 2021-08-23 DIAGNOSIS — I4891 Unspecified atrial fibrillation: Secondary | ICD-10-CM

## 2021-08-31 ENCOUNTER — Other Ambulatory Visit: Payer: Self-pay

## 2021-08-31 ENCOUNTER — Ambulatory Visit (INDEPENDENT_AMBULATORY_CARE_PROVIDER_SITE_OTHER): Payer: Medicare Other | Admitting: *Deleted

## 2021-08-31 DIAGNOSIS — Z5181 Encounter for therapeutic drug level monitoring: Secondary | ICD-10-CM

## 2021-08-31 DIAGNOSIS — Z952 Presence of prosthetic heart valve: Secondary | ICD-10-CM | POA: Diagnosis not present

## 2021-08-31 LAB — POCT INR: INR: 2.3 (ref 2.0–3.0)

## 2021-08-31 NOTE — Patient Instructions (Signed)
Description   Continue same dose 2 tablets daily except 1 tablet on Mondays and Thursdays Recheck in 4 weeks

## 2021-09-12 DIAGNOSIS — H401231 Low-tension glaucoma, bilateral, mild stage: Secondary | ICD-10-CM | POA: Diagnosis not present

## 2021-09-20 ENCOUNTER — Other Ambulatory Visit: Payer: Self-pay | Admitting: Cardiovascular Disease

## 2021-09-20 DIAGNOSIS — I4891 Unspecified atrial fibrillation: Secondary | ICD-10-CM

## 2021-10-02 ENCOUNTER — Ambulatory Visit (INDEPENDENT_AMBULATORY_CARE_PROVIDER_SITE_OTHER): Payer: Medicare Other | Admitting: *Deleted

## 2021-10-02 DIAGNOSIS — R7989 Other specified abnormal findings of blood chemistry: Secondary | ICD-10-CM | POA: Diagnosis not present

## 2021-10-02 DIAGNOSIS — Z952 Presence of prosthetic heart valve: Secondary | ICD-10-CM | POA: Diagnosis not present

## 2021-10-02 DIAGNOSIS — N183 Chronic kidney disease, stage 3 unspecified: Secondary | ICD-10-CM | POA: Diagnosis not present

## 2021-10-02 DIAGNOSIS — Z5181 Encounter for therapeutic drug level monitoring: Secondary | ICD-10-CM | POA: Diagnosis not present

## 2021-10-02 DIAGNOSIS — E039 Hypothyroidism, unspecified: Secondary | ICD-10-CM | POA: Diagnosis not present

## 2021-10-02 DIAGNOSIS — E559 Vitamin D deficiency, unspecified: Secondary | ICD-10-CM | POA: Diagnosis not present

## 2021-10-02 DIAGNOSIS — I10 Essential (primary) hypertension: Secondary | ICD-10-CM | POA: Diagnosis not present

## 2021-10-02 DIAGNOSIS — K219 Gastro-esophageal reflux disease without esophagitis: Secondary | ICD-10-CM | POA: Diagnosis not present

## 2021-10-02 LAB — POCT INR: INR: 2.2 (ref 2.0–3.0)

## 2021-10-02 NOTE — Patient Instructions (Signed)
Continue same dose 2 tablets daily except 1 tablet on Mondays and Thursdays Recheck in 5 weeks

## 2021-10-04 DIAGNOSIS — D696 Thrombocytopenia, unspecified: Secondary | ICD-10-CM | POA: Diagnosis not present

## 2021-10-04 DIAGNOSIS — Z0001 Encounter for general adult medical examination with abnormal findings: Secondary | ICD-10-CM | POA: Diagnosis not present

## 2021-10-04 DIAGNOSIS — I4821 Permanent atrial fibrillation: Secondary | ICD-10-CM | POA: Diagnosis not present

## 2021-10-04 DIAGNOSIS — I10 Essential (primary) hypertension: Secondary | ICD-10-CM | POA: Diagnosis not present

## 2021-10-04 DIAGNOSIS — I4729 Other ventricular tachycardia: Secondary | ICD-10-CM | POA: Diagnosis not present

## 2021-10-04 DIAGNOSIS — Z952 Presence of prosthetic heart valve: Secondary | ICD-10-CM | POA: Diagnosis not present

## 2021-10-16 DIAGNOSIS — Z7901 Long term (current) use of anticoagulants: Secondary | ICD-10-CM | POA: Diagnosis not present

## 2021-10-16 DIAGNOSIS — R1310 Dysphagia, unspecified: Secondary | ICD-10-CM | POA: Diagnosis not present

## 2021-10-16 DIAGNOSIS — D696 Thrombocytopenia, unspecified: Secondary | ICD-10-CM | POA: Diagnosis not present

## 2021-10-16 DIAGNOSIS — H9192 Unspecified hearing loss, left ear: Secondary | ICD-10-CM | POA: Diagnosis not present

## 2021-10-16 DIAGNOSIS — M19042 Primary osteoarthritis, left hand: Secondary | ICD-10-CM | POA: Diagnosis not present

## 2021-10-16 DIAGNOSIS — N183 Chronic kidney disease, stage 3 unspecified: Secondary | ICD-10-CM | POA: Diagnosis not present

## 2021-10-16 DIAGNOSIS — M810 Age-related osteoporosis without current pathological fracture: Secondary | ICD-10-CM | POA: Diagnosis not present

## 2021-10-16 DIAGNOSIS — Z952 Presence of prosthetic heart valve: Secondary | ICD-10-CM | POA: Diagnosis not present

## 2021-10-16 DIAGNOSIS — I129 Hypertensive chronic kidney disease with stage 1 through stage 4 chronic kidney disease, or unspecified chronic kidney disease: Secondary | ICD-10-CM | POA: Diagnosis not present

## 2021-10-16 DIAGNOSIS — E782 Mixed hyperlipidemia: Secondary | ICD-10-CM | POA: Diagnosis not present

## 2021-10-16 DIAGNOSIS — K760 Fatty (change of) liver, not elsewhere classified: Secondary | ICD-10-CM | POA: Diagnosis not present

## 2021-10-16 DIAGNOSIS — M65352 Trigger finger, left little finger: Secondary | ICD-10-CM | POA: Diagnosis not present

## 2021-10-16 DIAGNOSIS — Z95810 Presence of automatic (implantable) cardiac defibrillator: Secondary | ICD-10-CM | POA: Diagnosis not present

## 2021-10-16 DIAGNOSIS — K5900 Constipation, unspecified: Secondary | ICD-10-CM | POA: Diagnosis not present

## 2021-10-16 DIAGNOSIS — Z9181 History of falling: Secondary | ICD-10-CM | POA: Diagnosis not present

## 2021-10-16 DIAGNOSIS — I4821 Permanent atrial fibrillation: Secondary | ICD-10-CM | POA: Diagnosis not present

## 2021-10-16 DIAGNOSIS — I4729 Other ventricular tachycardia: Secondary | ICD-10-CM | POA: Diagnosis not present

## 2021-10-16 DIAGNOSIS — E02 Subclinical iodine-deficiency hypothyroidism: Secondary | ICD-10-CM | POA: Diagnosis not present

## 2021-10-16 DIAGNOSIS — D631 Anemia in chronic kidney disease: Secondary | ICD-10-CM | POA: Diagnosis not present

## 2021-10-16 DIAGNOSIS — I2510 Atherosclerotic heart disease of native coronary artery without angina pectoris: Secondary | ICD-10-CM | POA: Diagnosis not present

## 2021-10-16 DIAGNOSIS — F32A Depression, unspecified: Secondary | ICD-10-CM | POA: Diagnosis not present

## 2021-10-19 DIAGNOSIS — K5900 Constipation, unspecified: Secondary | ICD-10-CM | POA: Diagnosis not present

## 2021-10-19 DIAGNOSIS — K760 Fatty (change of) liver, not elsewhere classified: Secondary | ICD-10-CM | POA: Diagnosis not present

## 2021-10-19 DIAGNOSIS — M19042 Primary osteoarthritis, left hand: Secondary | ICD-10-CM | POA: Diagnosis not present

## 2021-10-19 DIAGNOSIS — F32A Depression, unspecified: Secondary | ICD-10-CM | POA: Diagnosis not present

## 2021-10-19 DIAGNOSIS — D696 Thrombocytopenia, unspecified: Secondary | ICD-10-CM | POA: Diagnosis not present

## 2021-10-19 DIAGNOSIS — R1310 Dysphagia, unspecified: Secondary | ICD-10-CM | POA: Diagnosis not present

## 2021-10-19 DIAGNOSIS — I2510 Atherosclerotic heart disease of native coronary artery without angina pectoris: Secondary | ICD-10-CM | POA: Diagnosis not present

## 2021-10-19 DIAGNOSIS — E02 Subclinical iodine-deficiency hypothyroidism: Secondary | ICD-10-CM | POA: Diagnosis not present

## 2021-10-19 DIAGNOSIS — Z9181 History of falling: Secondary | ICD-10-CM | POA: Diagnosis not present

## 2021-10-19 DIAGNOSIS — M810 Age-related osteoporosis without current pathological fracture: Secondary | ICD-10-CM | POA: Diagnosis not present

## 2021-10-19 DIAGNOSIS — N183 Chronic kidney disease, stage 3 unspecified: Secondary | ICD-10-CM | POA: Diagnosis not present

## 2021-10-19 DIAGNOSIS — D631 Anemia in chronic kidney disease: Secondary | ICD-10-CM | POA: Diagnosis not present

## 2021-10-19 DIAGNOSIS — I129 Hypertensive chronic kidney disease with stage 1 through stage 4 chronic kidney disease, or unspecified chronic kidney disease: Secondary | ICD-10-CM | POA: Diagnosis not present

## 2021-10-19 DIAGNOSIS — I4729 Other ventricular tachycardia: Secondary | ICD-10-CM | POA: Diagnosis not present

## 2021-10-19 DIAGNOSIS — Z95810 Presence of automatic (implantable) cardiac defibrillator: Secondary | ICD-10-CM | POA: Diagnosis not present

## 2021-10-19 DIAGNOSIS — I4821 Permanent atrial fibrillation: Secondary | ICD-10-CM | POA: Diagnosis not present

## 2021-10-19 DIAGNOSIS — Z952 Presence of prosthetic heart valve: Secondary | ICD-10-CM | POA: Diagnosis not present

## 2021-10-19 DIAGNOSIS — M65352 Trigger finger, left little finger: Secondary | ICD-10-CM | POA: Diagnosis not present

## 2021-10-19 DIAGNOSIS — Z7901 Long term (current) use of anticoagulants: Secondary | ICD-10-CM | POA: Diagnosis not present

## 2021-10-19 DIAGNOSIS — H9192 Unspecified hearing loss, left ear: Secondary | ICD-10-CM | POA: Diagnosis not present

## 2021-10-19 DIAGNOSIS — E782 Mixed hyperlipidemia: Secondary | ICD-10-CM | POA: Diagnosis not present

## 2021-10-24 DIAGNOSIS — N183 Chronic kidney disease, stage 3 unspecified: Secondary | ICD-10-CM | POA: Diagnosis not present

## 2021-10-24 DIAGNOSIS — Z7901 Long term (current) use of anticoagulants: Secondary | ICD-10-CM | POA: Diagnosis not present

## 2021-10-24 DIAGNOSIS — R1310 Dysphagia, unspecified: Secondary | ICD-10-CM | POA: Diagnosis not present

## 2021-10-24 DIAGNOSIS — E782 Mixed hyperlipidemia: Secondary | ICD-10-CM | POA: Diagnosis not present

## 2021-10-24 DIAGNOSIS — E02 Subclinical iodine-deficiency hypothyroidism: Secondary | ICD-10-CM | POA: Diagnosis not present

## 2021-10-24 DIAGNOSIS — H9192 Unspecified hearing loss, left ear: Secondary | ICD-10-CM | POA: Diagnosis not present

## 2021-10-24 DIAGNOSIS — Z95810 Presence of automatic (implantable) cardiac defibrillator: Secondary | ICD-10-CM | POA: Diagnosis not present

## 2021-10-24 DIAGNOSIS — K760 Fatty (change of) liver, not elsewhere classified: Secondary | ICD-10-CM | POA: Diagnosis not present

## 2021-10-24 DIAGNOSIS — I2510 Atherosclerotic heart disease of native coronary artery without angina pectoris: Secondary | ICD-10-CM | POA: Diagnosis not present

## 2021-10-24 DIAGNOSIS — I4729 Other ventricular tachycardia: Secondary | ICD-10-CM | POA: Diagnosis not present

## 2021-10-24 DIAGNOSIS — K5900 Constipation, unspecified: Secondary | ICD-10-CM | POA: Diagnosis not present

## 2021-10-24 DIAGNOSIS — Z9181 History of falling: Secondary | ICD-10-CM | POA: Diagnosis not present

## 2021-10-24 DIAGNOSIS — F32A Depression, unspecified: Secondary | ICD-10-CM | POA: Diagnosis not present

## 2021-10-24 DIAGNOSIS — M65352 Trigger finger, left little finger: Secondary | ICD-10-CM | POA: Diagnosis not present

## 2021-10-24 DIAGNOSIS — I4821 Permanent atrial fibrillation: Secondary | ICD-10-CM | POA: Diagnosis not present

## 2021-10-24 DIAGNOSIS — M810 Age-related osteoporosis without current pathological fracture: Secondary | ICD-10-CM | POA: Diagnosis not present

## 2021-10-24 DIAGNOSIS — Z952 Presence of prosthetic heart valve: Secondary | ICD-10-CM | POA: Diagnosis not present

## 2021-10-24 DIAGNOSIS — I129 Hypertensive chronic kidney disease with stage 1 through stage 4 chronic kidney disease, or unspecified chronic kidney disease: Secondary | ICD-10-CM | POA: Diagnosis not present

## 2021-10-24 DIAGNOSIS — M19042 Primary osteoarthritis, left hand: Secondary | ICD-10-CM | POA: Diagnosis not present

## 2021-10-24 DIAGNOSIS — D696 Thrombocytopenia, unspecified: Secondary | ICD-10-CM | POA: Diagnosis not present

## 2021-10-24 DIAGNOSIS — D631 Anemia in chronic kidney disease: Secondary | ICD-10-CM | POA: Diagnosis not present

## 2021-10-25 ENCOUNTER — Other Ambulatory Visit: Payer: Self-pay | Admitting: Cardiovascular Disease

## 2021-10-25 DIAGNOSIS — I4891 Unspecified atrial fibrillation: Secondary | ICD-10-CM

## 2021-10-26 DIAGNOSIS — D631 Anemia in chronic kidney disease: Secondary | ICD-10-CM | POA: Diagnosis not present

## 2021-10-26 DIAGNOSIS — I2510 Atherosclerotic heart disease of native coronary artery without angina pectoris: Secondary | ICD-10-CM | POA: Diagnosis not present

## 2021-10-26 DIAGNOSIS — E02 Subclinical iodine-deficiency hypothyroidism: Secondary | ICD-10-CM | POA: Diagnosis not present

## 2021-10-26 DIAGNOSIS — M65352 Trigger finger, left little finger: Secondary | ICD-10-CM | POA: Diagnosis not present

## 2021-10-26 DIAGNOSIS — K5900 Constipation, unspecified: Secondary | ICD-10-CM | POA: Diagnosis not present

## 2021-10-26 DIAGNOSIS — Z952 Presence of prosthetic heart valve: Secondary | ICD-10-CM | POA: Diagnosis not present

## 2021-10-26 DIAGNOSIS — N183 Chronic kidney disease, stage 3 unspecified: Secondary | ICD-10-CM | POA: Diagnosis not present

## 2021-10-26 DIAGNOSIS — H9192 Unspecified hearing loss, left ear: Secondary | ICD-10-CM | POA: Diagnosis not present

## 2021-10-26 DIAGNOSIS — I129 Hypertensive chronic kidney disease with stage 1 through stage 4 chronic kidney disease, or unspecified chronic kidney disease: Secondary | ICD-10-CM | POA: Diagnosis not present

## 2021-10-26 DIAGNOSIS — M19042 Primary osteoarthritis, left hand: Secondary | ICD-10-CM | POA: Diagnosis not present

## 2021-10-26 DIAGNOSIS — I4821 Permanent atrial fibrillation: Secondary | ICD-10-CM | POA: Diagnosis not present

## 2021-10-26 DIAGNOSIS — R1310 Dysphagia, unspecified: Secondary | ICD-10-CM | POA: Diagnosis not present

## 2021-10-26 DIAGNOSIS — Z9181 History of falling: Secondary | ICD-10-CM | POA: Diagnosis not present

## 2021-10-26 DIAGNOSIS — M810 Age-related osteoporosis without current pathological fracture: Secondary | ICD-10-CM | POA: Diagnosis not present

## 2021-10-26 DIAGNOSIS — K760 Fatty (change of) liver, not elsewhere classified: Secondary | ICD-10-CM | POA: Diagnosis not present

## 2021-10-26 DIAGNOSIS — Z95810 Presence of automatic (implantable) cardiac defibrillator: Secondary | ICD-10-CM | POA: Diagnosis not present

## 2021-10-26 DIAGNOSIS — D696 Thrombocytopenia, unspecified: Secondary | ICD-10-CM | POA: Diagnosis not present

## 2021-10-26 DIAGNOSIS — E782 Mixed hyperlipidemia: Secondary | ICD-10-CM | POA: Diagnosis not present

## 2021-10-26 DIAGNOSIS — Z7901 Long term (current) use of anticoagulants: Secondary | ICD-10-CM | POA: Diagnosis not present

## 2021-10-26 DIAGNOSIS — F32A Depression, unspecified: Secondary | ICD-10-CM | POA: Diagnosis not present

## 2021-10-26 DIAGNOSIS — I4729 Other ventricular tachycardia: Secondary | ICD-10-CM | POA: Diagnosis not present

## 2021-10-31 DIAGNOSIS — I129 Hypertensive chronic kidney disease with stage 1 through stage 4 chronic kidney disease, or unspecified chronic kidney disease: Secondary | ICD-10-CM | POA: Diagnosis not present

## 2021-10-31 DIAGNOSIS — Z95810 Presence of automatic (implantable) cardiac defibrillator: Secondary | ICD-10-CM | POA: Diagnosis not present

## 2021-10-31 DIAGNOSIS — Z9181 History of falling: Secondary | ICD-10-CM | POA: Diagnosis not present

## 2021-10-31 DIAGNOSIS — I2510 Atherosclerotic heart disease of native coronary artery without angina pectoris: Secondary | ICD-10-CM | POA: Diagnosis not present

## 2021-10-31 DIAGNOSIS — R1310 Dysphagia, unspecified: Secondary | ICD-10-CM | POA: Diagnosis not present

## 2021-10-31 DIAGNOSIS — H9192 Unspecified hearing loss, left ear: Secondary | ICD-10-CM | POA: Diagnosis not present

## 2021-10-31 DIAGNOSIS — M19042 Primary osteoarthritis, left hand: Secondary | ICD-10-CM | POA: Diagnosis not present

## 2021-10-31 DIAGNOSIS — I4729 Other ventricular tachycardia: Secondary | ICD-10-CM | POA: Diagnosis not present

## 2021-10-31 DIAGNOSIS — M65352 Trigger finger, left little finger: Secondary | ICD-10-CM | POA: Diagnosis not present

## 2021-10-31 DIAGNOSIS — D631 Anemia in chronic kidney disease: Secondary | ICD-10-CM | POA: Diagnosis not present

## 2021-10-31 DIAGNOSIS — M810 Age-related osteoporosis without current pathological fracture: Secondary | ICD-10-CM | POA: Diagnosis not present

## 2021-10-31 DIAGNOSIS — Z952 Presence of prosthetic heart valve: Secondary | ICD-10-CM | POA: Diagnosis not present

## 2021-10-31 DIAGNOSIS — F32A Depression, unspecified: Secondary | ICD-10-CM | POA: Diagnosis not present

## 2021-10-31 DIAGNOSIS — E02 Subclinical iodine-deficiency hypothyroidism: Secondary | ICD-10-CM | POA: Diagnosis not present

## 2021-10-31 DIAGNOSIS — K5900 Constipation, unspecified: Secondary | ICD-10-CM | POA: Diagnosis not present

## 2021-10-31 DIAGNOSIS — K760 Fatty (change of) liver, not elsewhere classified: Secondary | ICD-10-CM | POA: Diagnosis not present

## 2021-10-31 DIAGNOSIS — D696 Thrombocytopenia, unspecified: Secondary | ICD-10-CM | POA: Diagnosis not present

## 2021-10-31 DIAGNOSIS — N183 Chronic kidney disease, stage 3 unspecified: Secondary | ICD-10-CM | POA: Diagnosis not present

## 2021-10-31 DIAGNOSIS — Z7901 Long term (current) use of anticoagulants: Secondary | ICD-10-CM | POA: Diagnosis not present

## 2021-10-31 DIAGNOSIS — E782 Mixed hyperlipidemia: Secondary | ICD-10-CM | POA: Diagnosis not present

## 2021-10-31 DIAGNOSIS — I4821 Permanent atrial fibrillation: Secondary | ICD-10-CM | POA: Diagnosis not present

## 2021-11-02 DIAGNOSIS — R1310 Dysphagia, unspecified: Secondary | ICD-10-CM | POA: Diagnosis not present

## 2021-11-02 DIAGNOSIS — Z9181 History of falling: Secondary | ICD-10-CM | POA: Diagnosis not present

## 2021-11-02 DIAGNOSIS — M810 Age-related osteoporosis without current pathological fracture: Secondary | ICD-10-CM | POA: Diagnosis not present

## 2021-11-02 DIAGNOSIS — Z95810 Presence of automatic (implantable) cardiac defibrillator: Secondary | ICD-10-CM | POA: Diagnosis not present

## 2021-11-02 DIAGNOSIS — D631 Anemia in chronic kidney disease: Secondary | ICD-10-CM | POA: Diagnosis not present

## 2021-11-02 DIAGNOSIS — I129 Hypertensive chronic kidney disease with stage 1 through stage 4 chronic kidney disease, or unspecified chronic kidney disease: Secondary | ICD-10-CM | POA: Diagnosis not present

## 2021-11-02 DIAGNOSIS — K760 Fatty (change of) liver, not elsewhere classified: Secondary | ICD-10-CM | POA: Diagnosis not present

## 2021-11-02 DIAGNOSIS — M65352 Trigger finger, left little finger: Secondary | ICD-10-CM | POA: Diagnosis not present

## 2021-11-02 DIAGNOSIS — H9192 Unspecified hearing loss, left ear: Secondary | ICD-10-CM | POA: Diagnosis not present

## 2021-11-02 DIAGNOSIS — Z7901 Long term (current) use of anticoagulants: Secondary | ICD-10-CM | POA: Diagnosis not present

## 2021-11-02 DIAGNOSIS — D696 Thrombocytopenia, unspecified: Secondary | ICD-10-CM | POA: Diagnosis not present

## 2021-11-02 DIAGNOSIS — N183 Chronic kidney disease, stage 3 unspecified: Secondary | ICD-10-CM | POA: Diagnosis not present

## 2021-11-02 DIAGNOSIS — I4821 Permanent atrial fibrillation: Secondary | ICD-10-CM | POA: Diagnosis not present

## 2021-11-02 DIAGNOSIS — I4729 Other ventricular tachycardia: Secondary | ICD-10-CM | POA: Diagnosis not present

## 2021-11-02 DIAGNOSIS — Z952 Presence of prosthetic heart valve: Secondary | ICD-10-CM | POA: Diagnosis not present

## 2021-11-02 DIAGNOSIS — E782 Mixed hyperlipidemia: Secondary | ICD-10-CM | POA: Diagnosis not present

## 2021-11-02 DIAGNOSIS — F32A Depression, unspecified: Secondary | ICD-10-CM | POA: Diagnosis not present

## 2021-11-02 DIAGNOSIS — K5900 Constipation, unspecified: Secondary | ICD-10-CM | POA: Diagnosis not present

## 2021-11-02 DIAGNOSIS — E02 Subclinical iodine-deficiency hypothyroidism: Secondary | ICD-10-CM | POA: Diagnosis not present

## 2021-11-02 DIAGNOSIS — I2510 Atherosclerotic heart disease of native coronary artery without angina pectoris: Secondary | ICD-10-CM | POA: Diagnosis not present

## 2021-11-02 DIAGNOSIS — M19042 Primary osteoarthritis, left hand: Secondary | ICD-10-CM | POA: Diagnosis not present

## 2021-11-07 ENCOUNTER — Ambulatory Visit (INDEPENDENT_AMBULATORY_CARE_PROVIDER_SITE_OTHER): Payer: Medicare Other | Admitting: *Deleted

## 2021-11-07 DIAGNOSIS — E02 Subclinical iodine-deficiency hypothyroidism: Secondary | ICD-10-CM | POA: Diagnosis not present

## 2021-11-07 DIAGNOSIS — Z5181 Encounter for therapeutic drug level monitoring: Secondary | ICD-10-CM

## 2021-11-07 DIAGNOSIS — F32A Depression, unspecified: Secondary | ICD-10-CM | POA: Diagnosis not present

## 2021-11-07 DIAGNOSIS — D696 Thrombocytopenia, unspecified: Secondary | ICD-10-CM | POA: Diagnosis not present

## 2021-11-07 DIAGNOSIS — I4891 Unspecified atrial fibrillation: Secondary | ICD-10-CM

## 2021-11-07 DIAGNOSIS — M19042 Primary osteoarthritis, left hand: Secondary | ICD-10-CM | POA: Diagnosis not present

## 2021-11-07 DIAGNOSIS — D631 Anemia in chronic kidney disease: Secondary | ICD-10-CM | POA: Diagnosis not present

## 2021-11-07 DIAGNOSIS — Z952 Presence of prosthetic heart valve: Secondary | ICD-10-CM | POA: Diagnosis not present

## 2021-11-07 DIAGNOSIS — H9192 Unspecified hearing loss, left ear: Secondary | ICD-10-CM | POA: Diagnosis not present

## 2021-11-07 DIAGNOSIS — I4729 Other ventricular tachycardia: Secondary | ICD-10-CM | POA: Diagnosis not present

## 2021-11-07 DIAGNOSIS — E782 Mixed hyperlipidemia: Secondary | ICD-10-CM | POA: Diagnosis not present

## 2021-11-07 DIAGNOSIS — I2510 Atherosclerotic heart disease of native coronary artery without angina pectoris: Secondary | ICD-10-CM | POA: Diagnosis not present

## 2021-11-07 DIAGNOSIS — M810 Age-related osteoporosis without current pathological fracture: Secondary | ICD-10-CM | POA: Diagnosis not present

## 2021-11-07 DIAGNOSIS — N183 Chronic kidney disease, stage 3 unspecified: Secondary | ICD-10-CM | POA: Diagnosis not present

## 2021-11-07 DIAGNOSIS — I129 Hypertensive chronic kidney disease with stage 1 through stage 4 chronic kidney disease, or unspecified chronic kidney disease: Secondary | ICD-10-CM | POA: Diagnosis not present

## 2021-11-07 DIAGNOSIS — M65352 Trigger finger, left little finger: Secondary | ICD-10-CM | POA: Diagnosis not present

## 2021-11-07 DIAGNOSIS — R1310 Dysphagia, unspecified: Secondary | ICD-10-CM | POA: Diagnosis not present

## 2021-11-07 DIAGNOSIS — K760 Fatty (change of) liver, not elsewhere classified: Secondary | ICD-10-CM | POA: Diagnosis not present

## 2021-11-07 DIAGNOSIS — Z95810 Presence of automatic (implantable) cardiac defibrillator: Secondary | ICD-10-CM | POA: Diagnosis not present

## 2021-11-07 DIAGNOSIS — Z9181 History of falling: Secondary | ICD-10-CM | POA: Diagnosis not present

## 2021-11-07 DIAGNOSIS — K5900 Constipation, unspecified: Secondary | ICD-10-CM | POA: Diagnosis not present

## 2021-11-07 DIAGNOSIS — Z7901 Long term (current) use of anticoagulants: Secondary | ICD-10-CM | POA: Diagnosis not present

## 2021-11-07 DIAGNOSIS — I4821 Permanent atrial fibrillation: Secondary | ICD-10-CM | POA: Diagnosis not present

## 2021-11-07 LAB — POCT INR: INR: 2.4 (ref 2.0–3.0)

## 2021-11-07 NOTE — Patient Instructions (Signed)
Continue same dose 2 tablets daily except 1 tablet on Mondays and Thursdays Recheck in 6 weeks

## 2021-11-10 DIAGNOSIS — I2510 Atherosclerotic heart disease of native coronary artery without angina pectoris: Secondary | ICD-10-CM | POA: Diagnosis not present

## 2021-11-10 DIAGNOSIS — Z95810 Presence of automatic (implantable) cardiac defibrillator: Secondary | ICD-10-CM | POA: Diagnosis not present

## 2021-11-10 DIAGNOSIS — N183 Chronic kidney disease, stage 3 unspecified: Secondary | ICD-10-CM | POA: Diagnosis not present

## 2021-11-10 DIAGNOSIS — Z952 Presence of prosthetic heart valve: Secondary | ICD-10-CM | POA: Diagnosis not present

## 2021-11-10 DIAGNOSIS — I4729 Other ventricular tachycardia: Secondary | ICD-10-CM | POA: Diagnosis not present

## 2021-11-10 DIAGNOSIS — K760 Fatty (change of) liver, not elsewhere classified: Secondary | ICD-10-CM | POA: Diagnosis not present

## 2021-11-10 DIAGNOSIS — E782 Mixed hyperlipidemia: Secondary | ICD-10-CM | POA: Diagnosis not present

## 2021-11-10 DIAGNOSIS — D631 Anemia in chronic kidney disease: Secondary | ICD-10-CM | POA: Diagnosis not present

## 2021-11-10 DIAGNOSIS — I4821 Permanent atrial fibrillation: Secondary | ICD-10-CM | POA: Diagnosis not present

## 2021-11-10 DIAGNOSIS — K5900 Constipation, unspecified: Secondary | ICD-10-CM | POA: Diagnosis not present

## 2021-11-10 DIAGNOSIS — Z7901 Long term (current) use of anticoagulants: Secondary | ICD-10-CM | POA: Diagnosis not present

## 2021-11-10 DIAGNOSIS — M810 Age-related osteoporosis without current pathological fracture: Secondary | ICD-10-CM | POA: Diagnosis not present

## 2021-11-10 DIAGNOSIS — F32A Depression, unspecified: Secondary | ICD-10-CM | POA: Diagnosis not present

## 2021-11-10 DIAGNOSIS — I129 Hypertensive chronic kidney disease with stage 1 through stage 4 chronic kidney disease, or unspecified chronic kidney disease: Secondary | ICD-10-CM | POA: Diagnosis not present

## 2021-11-10 DIAGNOSIS — M19042 Primary osteoarthritis, left hand: Secondary | ICD-10-CM | POA: Diagnosis not present

## 2021-11-10 DIAGNOSIS — Z9181 History of falling: Secondary | ICD-10-CM | POA: Diagnosis not present

## 2021-11-10 DIAGNOSIS — E02 Subclinical iodine-deficiency hypothyroidism: Secondary | ICD-10-CM | POA: Diagnosis not present

## 2021-11-10 DIAGNOSIS — M65352 Trigger finger, left little finger: Secondary | ICD-10-CM | POA: Diagnosis not present

## 2021-11-10 DIAGNOSIS — D696 Thrombocytopenia, unspecified: Secondary | ICD-10-CM | POA: Diagnosis not present

## 2021-11-10 DIAGNOSIS — R1310 Dysphagia, unspecified: Secondary | ICD-10-CM | POA: Diagnosis not present

## 2021-11-10 DIAGNOSIS — H9192 Unspecified hearing loss, left ear: Secondary | ICD-10-CM | POA: Diagnosis not present

## 2021-11-15 DIAGNOSIS — I129 Hypertensive chronic kidney disease with stage 1 through stage 4 chronic kidney disease, or unspecified chronic kidney disease: Secondary | ICD-10-CM | POA: Diagnosis not present

## 2021-11-15 DIAGNOSIS — Z952 Presence of prosthetic heart valve: Secondary | ICD-10-CM | POA: Diagnosis not present

## 2021-11-15 DIAGNOSIS — K760 Fatty (change of) liver, not elsewhere classified: Secondary | ICD-10-CM | POA: Diagnosis not present

## 2021-11-15 DIAGNOSIS — Z7901 Long term (current) use of anticoagulants: Secondary | ICD-10-CM | POA: Diagnosis not present

## 2021-11-15 DIAGNOSIS — Z9181 History of falling: Secondary | ICD-10-CM | POA: Diagnosis not present

## 2021-11-15 DIAGNOSIS — I2510 Atherosclerotic heart disease of native coronary artery without angina pectoris: Secondary | ICD-10-CM | POA: Diagnosis not present

## 2021-11-15 DIAGNOSIS — E782 Mixed hyperlipidemia: Secondary | ICD-10-CM | POA: Diagnosis not present

## 2021-11-15 DIAGNOSIS — D631 Anemia in chronic kidney disease: Secondary | ICD-10-CM | POA: Diagnosis not present

## 2021-11-15 DIAGNOSIS — E02 Subclinical iodine-deficiency hypothyroidism: Secondary | ICD-10-CM | POA: Diagnosis not present

## 2021-11-15 DIAGNOSIS — H9192 Unspecified hearing loss, left ear: Secondary | ICD-10-CM | POA: Diagnosis not present

## 2021-11-15 DIAGNOSIS — D696 Thrombocytopenia, unspecified: Secondary | ICD-10-CM | POA: Diagnosis not present

## 2021-11-15 DIAGNOSIS — K5900 Constipation, unspecified: Secondary | ICD-10-CM | POA: Diagnosis not present

## 2021-11-15 DIAGNOSIS — I4729 Other ventricular tachycardia: Secondary | ICD-10-CM | POA: Diagnosis not present

## 2021-11-15 DIAGNOSIS — R1310 Dysphagia, unspecified: Secondary | ICD-10-CM | POA: Diagnosis not present

## 2021-11-15 DIAGNOSIS — I4821 Permanent atrial fibrillation: Secondary | ICD-10-CM | POA: Diagnosis not present

## 2021-11-15 DIAGNOSIS — M810 Age-related osteoporosis without current pathological fracture: Secondary | ICD-10-CM | POA: Diagnosis not present

## 2021-11-15 DIAGNOSIS — F32A Depression, unspecified: Secondary | ICD-10-CM | POA: Diagnosis not present

## 2021-11-15 DIAGNOSIS — M19042 Primary osteoarthritis, left hand: Secondary | ICD-10-CM | POA: Diagnosis not present

## 2021-11-15 DIAGNOSIS — Z95810 Presence of automatic (implantable) cardiac defibrillator: Secondary | ICD-10-CM | POA: Diagnosis not present

## 2021-11-15 DIAGNOSIS — N183 Chronic kidney disease, stage 3 unspecified: Secondary | ICD-10-CM | POA: Diagnosis not present

## 2021-11-15 DIAGNOSIS — M65352 Trigger finger, left little finger: Secondary | ICD-10-CM | POA: Diagnosis not present

## 2021-11-17 ENCOUNTER — Other Ambulatory Visit: Payer: Self-pay | Admitting: Cardiovascular Disease

## 2021-11-17 DIAGNOSIS — I4891 Unspecified atrial fibrillation: Secondary | ICD-10-CM

## 2021-11-22 DIAGNOSIS — D696 Thrombocytopenia, unspecified: Secondary | ICD-10-CM | POA: Diagnosis not present

## 2021-11-22 DIAGNOSIS — F32A Depression, unspecified: Secondary | ICD-10-CM | POA: Diagnosis not present

## 2021-11-22 DIAGNOSIS — M65352 Trigger finger, left little finger: Secondary | ICD-10-CM | POA: Diagnosis not present

## 2021-11-22 DIAGNOSIS — D631 Anemia in chronic kidney disease: Secondary | ICD-10-CM | POA: Diagnosis not present

## 2021-11-22 DIAGNOSIS — K5900 Constipation, unspecified: Secondary | ICD-10-CM | POA: Diagnosis not present

## 2021-11-22 DIAGNOSIS — I4729 Other ventricular tachycardia: Secondary | ICD-10-CM | POA: Diagnosis not present

## 2021-11-22 DIAGNOSIS — I2510 Atherosclerotic heart disease of native coronary artery without angina pectoris: Secondary | ICD-10-CM | POA: Diagnosis not present

## 2021-11-22 DIAGNOSIS — R1310 Dysphagia, unspecified: Secondary | ICD-10-CM | POA: Diagnosis not present

## 2021-11-22 DIAGNOSIS — I129 Hypertensive chronic kidney disease with stage 1 through stage 4 chronic kidney disease, or unspecified chronic kidney disease: Secondary | ICD-10-CM | POA: Diagnosis not present

## 2021-11-22 DIAGNOSIS — I4821 Permanent atrial fibrillation: Secondary | ICD-10-CM | POA: Diagnosis not present

## 2021-11-22 DIAGNOSIS — M19042 Primary osteoarthritis, left hand: Secondary | ICD-10-CM | POA: Diagnosis not present

## 2021-11-22 DIAGNOSIS — Z95810 Presence of automatic (implantable) cardiac defibrillator: Secondary | ICD-10-CM | POA: Diagnosis not present

## 2021-11-22 DIAGNOSIS — Z9181 History of falling: Secondary | ICD-10-CM | POA: Diagnosis not present

## 2021-11-22 DIAGNOSIS — E02 Subclinical iodine-deficiency hypothyroidism: Secondary | ICD-10-CM | POA: Diagnosis not present

## 2021-11-22 DIAGNOSIS — E782 Mixed hyperlipidemia: Secondary | ICD-10-CM | POA: Diagnosis not present

## 2021-11-22 DIAGNOSIS — K760 Fatty (change of) liver, not elsewhere classified: Secondary | ICD-10-CM | POA: Diagnosis not present

## 2021-11-22 DIAGNOSIS — H9192 Unspecified hearing loss, left ear: Secondary | ICD-10-CM | POA: Diagnosis not present

## 2021-11-22 DIAGNOSIS — Z7901 Long term (current) use of anticoagulants: Secondary | ICD-10-CM | POA: Diagnosis not present

## 2021-11-22 DIAGNOSIS — M810 Age-related osteoporosis without current pathological fracture: Secondary | ICD-10-CM | POA: Diagnosis not present

## 2021-11-22 DIAGNOSIS — N183 Chronic kidney disease, stage 3 unspecified: Secondary | ICD-10-CM | POA: Diagnosis not present

## 2021-11-22 DIAGNOSIS — Z952 Presence of prosthetic heart valve: Secondary | ICD-10-CM | POA: Diagnosis not present

## 2021-11-28 DIAGNOSIS — I4729 Other ventricular tachycardia: Secondary | ICD-10-CM | POA: Diagnosis not present

## 2021-11-28 DIAGNOSIS — Z7901 Long term (current) use of anticoagulants: Secondary | ICD-10-CM | POA: Diagnosis not present

## 2021-11-28 DIAGNOSIS — M65352 Trigger finger, left little finger: Secondary | ICD-10-CM | POA: Diagnosis not present

## 2021-11-28 DIAGNOSIS — D631 Anemia in chronic kidney disease: Secondary | ICD-10-CM | POA: Diagnosis not present

## 2021-11-28 DIAGNOSIS — H9192 Unspecified hearing loss, left ear: Secondary | ICD-10-CM | POA: Diagnosis not present

## 2021-11-28 DIAGNOSIS — I2510 Atherosclerotic heart disease of native coronary artery without angina pectoris: Secondary | ICD-10-CM | POA: Diagnosis not present

## 2021-11-28 DIAGNOSIS — E782 Mixed hyperlipidemia: Secondary | ICD-10-CM | POA: Diagnosis not present

## 2021-11-28 DIAGNOSIS — E02 Subclinical iodine-deficiency hypothyroidism: Secondary | ICD-10-CM | POA: Diagnosis not present

## 2021-11-28 DIAGNOSIS — R1310 Dysphagia, unspecified: Secondary | ICD-10-CM | POA: Diagnosis not present

## 2021-11-28 DIAGNOSIS — K5900 Constipation, unspecified: Secondary | ICD-10-CM | POA: Diagnosis not present

## 2021-11-28 DIAGNOSIS — Z9181 History of falling: Secondary | ICD-10-CM | POA: Diagnosis not present

## 2021-11-28 DIAGNOSIS — D696 Thrombocytopenia, unspecified: Secondary | ICD-10-CM | POA: Diagnosis not present

## 2021-11-28 DIAGNOSIS — K760 Fatty (change of) liver, not elsewhere classified: Secondary | ICD-10-CM | POA: Diagnosis not present

## 2021-11-28 DIAGNOSIS — I129 Hypertensive chronic kidney disease with stage 1 through stage 4 chronic kidney disease, or unspecified chronic kidney disease: Secondary | ICD-10-CM | POA: Diagnosis not present

## 2021-11-28 DIAGNOSIS — Z95810 Presence of automatic (implantable) cardiac defibrillator: Secondary | ICD-10-CM | POA: Diagnosis not present

## 2021-11-28 DIAGNOSIS — M19042 Primary osteoarthritis, left hand: Secondary | ICD-10-CM | POA: Diagnosis not present

## 2021-11-28 DIAGNOSIS — I4821 Permanent atrial fibrillation: Secondary | ICD-10-CM | POA: Diagnosis not present

## 2021-11-28 DIAGNOSIS — N183 Chronic kidney disease, stage 3 unspecified: Secondary | ICD-10-CM | POA: Diagnosis not present

## 2021-11-28 DIAGNOSIS — Z952 Presence of prosthetic heart valve: Secondary | ICD-10-CM | POA: Diagnosis not present

## 2021-11-28 DIAGNOSIS — F32A Depression, unspecified: Secondary | ICD-10-CM | POA: Diagnosis not present

## 2021-11-28 DIAGNOSIS — M810 Age-related osteoporosis without current pathological fracture: Secondary | ICD-10-CM | POA: Diagnosis not present

## 2021-12-04 DIAGNOSIS — H401111 Primary open-angle glaucoma, right eye, mild stage: Secondary | ICD-10-CM | POA: Diagnosis not present

## 2021-12-05 DIAGNOSIS — D631 Anemia in chronic kidney disease: Secondary | ICD-10-CM | POA: Diagnosis not present

## 2021-12-05 DIAGNOSIS — D696 Thrombocytopenia, unspecified: Secondary | ICD-10-CM | POA: Diagnosis not present

## 2021-12-05 DIAGNOSIS — Z7901 Long term (current) use of anticoagulants: Secondary | ICD-10-CM | POA: Diagnosis not present

## 2021-12-05 DIAGNOSIS — H9192 Unspecified hearing loss, left ear: Secondary | ICD-10-CM | POA: Diagnosis not present

## 2021-12-05 DIAGNOSIS — I4821 Permanent atrial fibrillation: Secondary | ICD-10-CM | POA: Diagnosis not present

## 2021-12-05 DIAGNOSIS — N183 Chronic kidney disease, stage 3 unspecified: Secondary | ICD-10-CM | POA: Diagnosis not present

## 2021-12-05 DIAGNOSIS — Z9181 History of falling: Secondary | ICD-10-CM | POA: Diagnosis not present

## 2021-12-05 DIAGNOSIS — Z952 Presence of prosthetic heart valve: Secondary | ICD-10-CM | POA: Diagnosis not present

## 2021-12-05 DIAGNOSIS — I129 Hypertensive chronic kidney disease with stage 1 through stage 4 chronic kidney disease, or unspecified chronic kidney disease: Secondary | ICD-10-CM | POA: Diagnosis not present

## 2021-12-05 DIAGNOSIS — E02 Subclinical iodine-deficiency hypothyroidism: Secondary | ICD-10-CM | POA: Diagnosis not present

## 2021-12-05 DIAGNOSIS — M65352 Trigger finger, left little finger: Secondary | ICD-10-CM | POA: Diagnosis not present

## 2021-12-05 DIAGNOSIS — M810 Age-related osteoporosis without current pathological fracture: Secondary | ICD-10-CM | POA: Diagnosis not present

## 2021-12-05 DIAGNOSIS — F32A Depression, unspecified: Secondary | ICD-10-CM | POA: Diagnosis not present

## 2021-12-05 DIAGNOSIS — I2510 Atherosclerotic heart disease of native coronary artery without angina pectoris: Secondary | ICD-10-CM | POA: Diagnosis not present

## 2021-12-05 DIAGNOSIS — K5900 Constipation, unspecified: Secondary | ICD-10-CM | POA: Diagnosis not present

## 2021-12-05 DIAGNOSIS — K760 Fatty (change of) liver, not elsewhere classified: Secondary | ICD-10-CM | POA: Diagnosis not present

## 2021-12-05 DIAGNOSIS — M19042 Primary osteoarthritis, left hand: Secondary | ICD-10-CM | POA: Diagnosis not present

## 2021-12-05 DIAGNOSIS — I4729 Other ventricular tachycardia: Secondary | ICD-10-CM | POA: Diagnosis not present

## 2021-12-05 DIAGNOSIS — E782 Mixed hyperlipidemia: Secondary | ICD-10-CM | POA: Diagnosis not present

## 2021-12-05 DIAGNOSIS — R1310 Dysphagia, unspecified: Secondary | ICD-10-CM | POA: Diagnosis not present

## 2021-12-05 DIAGNOSIS — Z95810 Presence of automatic (implantable) cardiac defibrillator: Secondary | ICD-10-CM | POA: Diagnosis not present

## 2021-12-09 DIAGNOSIS — Z6822 Body mass index (BMI) 22.0-22.9, adult: Secondary | ICD-10-CM | POA: Diagnosis not present

## 2021-12-09 DIAGNOSIS — R059 Cough, unspecified: Secondary | ICD-10-CM | POA: Diagnosis not present

## 2021-12-19 ENCOUNTER — Other Ambulatory Visit: Payer: Self-pay

## 2021-12-19 ENCOUNTER — Ambulatory Visit (INDEPENDENT_AMBULATORY_CARE_PROVIDER_SITE_OTHER): Payer: Medicare Other | Admitting: *Deleted

## 2021-12-19 DIAGNOSIS — Z952 Presence of prosthetic heart valve: Secondary | ICD-10-CM | POA: Diagnosis not present

## 2021-12-19 DIAGNOSIS — I4891 Unspecified atrial fibrillation: Secondary | ICD-10-CM | POA: Diagnosis not present

## 2021-12-19 DIAGNOSIS — Z5181 Encounter for therapeutic drug level monitoring: Secondary | ICD-10-CM

## 2021-12-19 LAB — POCT INR: INR: 4.3 — AB (ref 2.0–3.0)

## 2021-12-19 NOTE — Patient Instructions (Signed)
Hold warfarin tonight, take 1/2 tablet tomorrow night then resume 2 tablets daily except 1 tablet on Mondays and Thursdays Recheck in 3 weeks

## 2021-12-21 ENCOUNTER — Other Ambulatory Visit: Payer: Self-pay | Admitting: Cardiovascular Disease

## 2021-12-21 DIAGNOSIS — I4891 Unspecified atrial fibrillation: Secondary | ICD-10-CM

## 2021-12-26 DIAGNOSIS — J209 Acute bronchitis, unspecified: Secondary | ICD-10-CM | POA: Diagnosis not present

## 2021-12-26 DIAGNOSIS — J019 Acute sinusitis, unspecified: Secondary | ICD-10-CM | POA: Diagnosis not present

## 2021-12-27 ENCOUNTER — Ambulatory Visit (INDEPENDENT_AMBULATORY_CARE_PROVIDER_SITE_OTHER): Payer: Medicare Other

## 2021-12-27 DIAGNOSIS — E039 Hypothyroidism, unspecified: Secondary | ICD-10-CM | POA: Diagnosis not present

## 2021-12-27 DIAGNOSIS — D649 Anemia, unspecified: Secondary | ICD-10-CM | POA: Diagnosis not present

## 2021-12-27 DIAGNOSIS — I4729 Other ventricular tachycardia: Secondary | ICD-10-CM | POA: Diagnosis not present

## 2021-12-28 LAB — CUP PACEART REMOTE DEVICE CHECK
Battery Impedance: 2165 Ohm
Battery Remaining Longevity: 33 mo
Battery Voltage: 2.75 V
Brady Statistic RV Percent Paced: 99 %
Date Time Interrogation Session: 20230222145902
Implantable Lead Implant Date: 20110701
Implantable Lead Implant Date: 20110701
Implantable Lead Location: 753859
Implantable Lead Location: 753860
Implantable Lead Model: 5076
Implantable Lead Model: 5076
Implantable Pulse Generator Implant Date: 20110701
Lead Channel Impedance Value: 488 Ohm
Lead Channel Impedance Value: 67 Ohm
Lead Channel Pacing Threshold Amplitude: 0.75 V
Lead Channel Pacing Threshold Pulse Width: 0.4 ms
Lead Channel Setting Pacing Amplitude: 2.5 V
Lead Channel Setting Pacing Pulse Width: 0.4 ms
Lead Channel Setting Sensing Sensitivity: 4 mV

## 2022-01-02 DIAGNOSIS — Z0001 Encounter for general adult medical examination with abnormal findings: Secondary | ICD-10-CM | POA: Diagnosis not present

## 2022-01-02 DIAGNOSIS — Z952 Presence of prosthetic heart valve: Secondary | ICD-10-CM | POA: Diagnosis not present

## 2022-01-02 DIAGNOSIS — D696 Thrombocytopenia, unspecified: Secondary | ICD-10-CM | POA: Diagnosis not present

## 2022-01-02 DIAGNOSIS — Z23 Encounter for immunization: Secondary | ICD-10-CM | POA: Diagnosis not present

## 2022-01-02 DIAGNOSIS — I10 Essential (primary) hypertension: Secondary | ICD-10-CM | POA: Diagnosis not present

## 2022-01-02 DIAGNOSIS — Z950 Presence of cardiac pacemaker: Secondary | ICD-10-CM | POA: Diagnosis not present

## 2022-01-02 DIAGNOSIS — I4821 Permanent atrial fibrillation: Secondary | ICD-10-CM | POA: Diagnosis not present

## 2022-01-02 NOTE — Progress Notes (Signed)
Remote pacemaker transmission.   

## 2022-01-03 NOTE — Progress Notes (Signed)
Remote pacemaker transmission.   

## 2022-01-11 ENCOUNTER — Telehealth: Payer: Self-pay

## 2022-01-11 NOTE — Telephone Encounter (Signed)
I spoke to the patient and scheduled her for INR 3/10 @ 8:00 in the [place] office.  She verbalized understanding ?

## 2022-01-12 ENCOUNTER — Other Ambulatory Visit: Payer: Self-pay

## 2022-01-12 ENCOUNTER — Ambulatory Visit (INDEPENDENT_AMBULATORY_CARE_PROVIDER_SITE_OTHER): Payer: Medicare Other | Admitting: *Deleted

## 2022-01-12 DIAGNOSIS — Z5181 Encounter for therapeutic drug level monitoring: Secondary | ICD-10-CM | POA: Diagnosis not present

## 2022-01-12 DIAGNOSIS — Z952 Presence of prosthetic heart valve: Secondary | ICD-10-CM

## 2022-01-12 LAB — POCT INR: INR: 2.8 (ref 2.0–3.0)

## 2022-01-12 NOTE — Patient Instructions (Signed)
Take warfarin 1 tablet today then resume 2 tablets daily except 1 tablet on Mondays and Thursdays ?Recheck in 4 weeks ?

## 2022-01-21 ENCOUNTER — Other Ambulatory Visit: Payer: Self-pay | Admitting: Cardiovascular Disease

## 2022-01-21 DIAGNOSIS — I4891 Unspecified atrial fibrillation: Secondary | ICD-10-CM

## 2022-02-08 ENCOUNTER — Ambulatory Visit (INDEPENDENT_AMBULATORY_CARE_PROVIDER_SITE_OTHER): Payer: Medicare Other | Admitting: *Deleted

## 2022-02-08 DIAGNOSIS — Z5181 Encounter for therapeutic drug level monitoring: Secondary | ICD-10-CM | POA: Diagnosis not present

## 2022-02-08 DIAGNOSIS — Z952 Presence of prosthetic heart valve: Secondary | ICD-10-CM | POA: Diagnosis not present

## 2022-02-08 LAB — POCT INR: INR: 2 (ref 2.0–3.0)

## 2022-02-08 NOTE — Patient Instructions (Signed)
Continue warfarin 2 tablets daily except 1 tablet on Mondays and Thursdays Recheck in 4 weeks 

## 2022-03-06 ENCOUNTER — Ambulatory Visit (INDEPENDENT_AMBULATORY_CARE_PROVIDER_SITE_OTHER): Payer: Medicare Other | Admitting: *Deleted

## 2022-03-06 DIAGNOSIS — Z5181 Encounter for therapeutic drug level monitoring: Secondary | ICD-10-CM

## 2022-03-06 DIAGNOSIS — Z952 Presence of prosthetic heart valve: Secondary | ICD-10-CM | POA: Diagnosis not present

## 2022-03-06 LAB — POCT INR: INR: 2.4 (ref 2.0–3.0)

## 2022-03-06 NOTE — Patient Instructions (Signed)
Continue warfarin 2 tablets daily except 1 tablet on Mondays and Thursdays ?Recheck in 6 weeks ?

## 2022-03-16 DIAGNOSIS — S7001XA Contusion of right hip, initial encounter: Secondary | ICD-10-CM | POA: Diagnosis not present

## 2022-03-16 DIAGNOSIS — M2550 Pain in unspecified joint: Secondary | ICD-10-CM | POA: Diagnosis not present

## 2022-03-16 DIAGNOSIS — W1839XA Other fall on same level, initial encounter: Secondary | ICD-10-CM | POA: Diagnosis not present

## 2022-03-16 DIAGNOSIS — N3090 Cystitis, unspecified without hematuria: Secondary | ICD-10-CM | POA: Diagnosis not present

## 2022-03-16 DIAGNOSIS — R0602 Shortness of breath: Secondary | ICD-10-CM | POA: Diagnosis not present

## 2022-03-16 DIAGNOSIS — G8929 Other chronic pain: Secondary | ICD-10-CM | POA: Diagnosis not present

## 2022-03-16 DIAGNOSIS — M545 Low back pain, unspecified: Secondary | ICD-10-CM | POA: Diagnosis not present

## 2022-03-16 DIAGNOSIS — Z043 Encounter for examination and observation following other accident: Secondary | ICD-10-CM | POA: Diagnosis not present

## 2022-03-16 DIAGNOSIS — S50311A Abrasion of right elbow, initial encounter: Secondary | ICD-10-CM | POA: Diagnosis not present

## 2022-03-16 DIAGNOSIS — S7000XA Contusion of unspecified hip, initial encounter: Secondary | ICD-10-CM | POA: Diagnosis not present

## 2022-03-20 DIAGNOSIS — I4821 Permanent atrial fibrillation: Secondary | ICD-10-CM | POA: Diagnosis not present

## 2022-03-20 DIAGNOSIS — Z950 Presence of cardiac pacemaker: Secondary | ICD-10-CM | POA: Diagnosis not present

## 2022-03-20 DIAGNOSIS — N390 Urinary tract infection, site not specified: Secondary | ICD-10-CM | POA: Diagnosis not present

## 2022-03-20 DIAGNOSIS — Z952 Presence of prosthetic heart valve: Secondary | ICD-10-CM | POA: Diagnosis not present

## 2022-03-20 DIAGNOSIS — R2681 Unsteadiness on feet: Secondary | ICD-10-CM | POA: Diagnosis not present

## 2022-03-20 DIAGNOSIS — E440 Moderate protein-calorie malnutrition: Secondary | ICD-10-CM | POA: Diagnosis not present

## 2022-03-28 ENCOUNTER — Ambulatory Visit (INDEPENDENT_AMBULATORY_CARE_PROVIDER_SITE_OTHER): Payer: Medicare Other

## 2022-03-28 DIAGNOSIS — E7849 Other hyperlipidemia: Secondary | ICD-10-CM | POA: Diagnosis not present

## 2022-03-28 DIAGNOSIS — R7989 Other specified abnormal findings of blood chemistry: Secondary | ICD-10-CM | POA: Diagnosis not present

## 2022-03-28 DIAGNOSIS — N183 Chronic kidney disease, stage 3 unspecified: Secondary | ICD-10-CM | POA: Diagnosis not present

## 2022-03-28 DIAGNOSIS — I4729 Other ventricular tachycardia: Secondary | ICD-10-CM | POA: Diagnosis not present

## 2022-03-28 DIAGNOSIS — E782 Mixed hyperlipidemia: Secondary | ICD-10-CM | POA: Diagnosis not present

## 2022-03-29 LAB — CUP PACEART REMOTE DEVICE CHECK
Battery Impedance: 2227 Ohm
Battery Remaining Longevity: 33 mo
Battery Voltage: 2.75 V
Brady Statistic RV Percent Paced: 99 %
Date Time Interrogation Session: 20230524143538
Implantable Lead Implant Date: 20110701
Implantable Lead Implant Date: 20110701
Implantable Lead Location: 753859
Implantable Lead Location: 753860
Implantable Lead Model: 5076
Implantable Lead Model: 5076
Implantable Pulse Generator Implant Date: 20110701
Lead Channel Impedance Value: 483 Ohm
Lead Channel Impedance Value: 67 Ohm
Lead Channel Pacing Threshold Amplitude: 0.625 V
Lead Channel Pacing Threshold Pulse Width: 0.4 ms
Lead Channel Setting Pacing Amplitude: 2.5 V
Lead Channel Setting Pacing Pulse Width: 0.4 ms
Lead Channel Setting Sensing Sensitivity: 2.8 mV

## 2022-04-03 DIAGNOSIS — R2681 Unsteadiness on feet: Secondary | ICD-10-CM | POA: Diagnosis not present

## 2022-04-03 DIAGNOSIS — Z950 Presence of cardiac pacemaker: Secondary | ICD-10-CM | POA: Diagnosis not present

## 2022-04-03 DIAGNOSIS — R634 Abnormal weight loss: Secondary | ICD-10-CM | POA: Diagnosis not present

## 2022-04-03 DIAGNOSIS — I4821 Permanent atrial fibrillation: Secondary | ICD-10-CM | POA: Diagnosis not present

## 2022-04-03 DIAGNOSIS — D696 Thrombocytopenia, unspecified: Secondary | ICD-10-CM | POA: Diagnosis not present

## 2022-04-03 DIAGNOSIS — E7849 Other hyperlipidemia: Secondary | ICD-10-CM | POA: Diagnosis not present

## 2022-04-03 DIAGNOSIS — E440 Moderate protein-calorie malnutrition: Secondary | ICD-10-CM | POA: Diagnosis not present

## 2022-04-03 DIAGNOSIS — Z952 Presence of prosthetic heart valve: Secondary | ICD-10-CM | POA: Diagnosis not present

## 2022-04-03 DIAGNOSIS — Z6820 Body mass index (BMI) 20.0-20.9, adult: Secondary | ICD-10-CM | POA: Diagnosis not present

## 2022-04-03 DIAGNOSIS — I10 Essential (primary) hypertension: Secondary | ICD-10-CM | POA: Diagnosis not present

## 2022-04-10 NOTE — Progress Notes (Signed)
Remote pacemaker transmission.   

## 2022-04-13 DIAGNOSIS — Z950 Presence of cardiac pacemaker: Secondary | ICD-10-CM | POA: Diagnosis not present

## 2022-04-13 DIAGNOSIS — M810 Age-related osteoporosis without current pathological fracture: Secondary | ICD-10-CM | POA: Diagnosis not present

## 2022-04-13 DIAGNOSIS — E440 Moderate protein-calorie malnutrition: Secondary | ICD-10-CM | POA: Diagnosis not present

## 2022-04-13 DIAGNOSIS — Z9181 History of falling: Secondary | ICD-10-CM | POA: Diagnosis not present

## 2022-04-13 DIAGNOSIS — I4821 Permanent atrial fibrillation: Secondary | ICD-10-CM | POA: Diagnosis not present

## 2022-04-13 DIAGNOSIS — D631 Anemia in chronic kidney disease: Secondary | ICD-10-CM | POA: Diagnosis not present

## 2022-04-13 DIAGNOSIS — M25521 Pain in right elbow: Secondary | ICD-10-CM | POA: Diagnosis not present

## 2022-04-13 DIAGNOSIS — Z7901 Long term (current) use of anticoagulants: Secondary | ICD-10-CM | POA: Diagnosis not present

## 2022-04-13 DIAGNOSIS — N183 Chronic kidney disease, stage 3 unspecified: Secondary | ICD-10-CM | POA: Diagnosis not present

## 2022-04-13 DIAGNOSIS — G8911 Acute pain due to trauma: Secondary | ICD-10-CM | POA: Diagnosis not present

## 2022-04-13 DIAGNOSIS — M25551 Pain in right hip: Secondary | ICD-10-CM | POA: Diagnosis not present

## 2022-04-13 DIAGNOSIS — G4761 Periodic limb movement disorder: Secondary | ICD-10-CM | POA: Diagnosis not present

## 2022-04-13 DIAGNOSIS — R296 Repeated falls: Secondary | ICD-10-CM | POA: Diagnosis not present

## 2022-04-13 DIAGNOSIS — N390 Urinary tract infection, site not specified: Secondary | ICD-10-CM | POA: Diagnosis not present

## 2022-04-17 ENCOUNTER — Ambulatory Visit: Payer: Medicare Other | Admitting: *Deleted

## 2022-04-17 DIAGNOSIS — Z952 Presence of prosthetic heart valve: Secondary | ICD-10-CM

## 2022-04-17 DIAGNOSIS — Z5181 Encounter for therapeutic drug level monitoring: Secondary | ICD-10-CM

## 2022-04-17 DIAGNOSIS — I4891 Unspecified atrial fibrillation: Secondary | ICD-10-CM

## 2022-04-17 LAB — POCT INR: INR: 3.3 — AB (ref 2.0–3.0)

## 2022-04-17 NOTE — Patient Instructions (Signed)
Hold warfarin tonight then resume 2 tablets daily except 1 tablet on Mondays and Thursdays Recheck in 3 weeks

## 2022-04-18 DIAGNOSIS — N390 Urinary tract infection, site not specified: Secondary | ICD-10-CM | POA: Diagnosis not present

## 2022-04-18 DIAGNOSIS — R296 Repeated falls: Secondary | ICD-10-CM | POA: Diagnosis not present

## 2022-04-18 DIAGNOSIS — G8911 Acute pain due to trauma: Secondary | ICD-10-CM | POA: Diagnosis not present

## 2022-04-18 DIAGNOSIS — G4761 Periodic limb movement disorder: Secondary | ICD-10-CM | POA: Diagnosis not present

## 2022-04-18 DIAGNOSIS — Z950 Presence of cardiac pacemaker: Secondary | ICD-10-CM | POA: Diagnosis not present

## 2022-04-18 DIAGNOSIS — D631 Anemia in chronic kidney disease: Secondary | ICD-10-CM | POA: Diagnosis not present

## 2022-04-18 DIAGNOSIS — N183 Chronic kidney disease, stage 3 unspecified: Secondary | ICD-10-CM | POA: Diagnosis not present

## 2022-04-18 DIAGNOSIS — M25551 Pain in right hip: Secondary | ICD-10-CM | POA: Diagnosis not present

## 2022-04-18 DIAGNOSIS — M25521 Pain in right elbow: Secondary | ICD-10-CM | POA: Diagnosis not present

## 2022-04-18 DIAGNOSIS — M810 Age-related osteoporosis without current pathological fracture: Secondary | ICD-10-CM | POA: Diagnosis not present

## 2022-04-18 DIAGNOSIS — I4821 Permanent atrial fibrillation: Secondary | ICD-10-CM | POA: Diagnosis not present

## 2022-04-18 DIAGNOSIS — E440 Moderate protein-calorie malnutrition: Secondary | ICD-10-CM | POA: Diagnosis not present

## 2022-04-18 DIAGNOSIS — Z9181 History of falling: Secondary | ICD-10-CM | POA: Diagnosis not present

## 2022-04-18 DIAGNOSIS — Z7901 Long term (current) use of anticoagulants: Secondary | ICD-10-CM | POA: Diagnosis not present

## 2022-04-19 ENCOUNTER — Other Ambulatory Visit: Payer: Self-pay | Admitting: Cardiovascular Disease

## 2022-04-19 DIAGNOSIS — I4891 Unspecified atrial fibrillation: Secondary | ICD-10-CM

## 2022-04-20 NOTE — Telephone Encounter (Signed)
Prescription refill request received for warfarin Lov: [place], 06/26/2021 Next INR check: 6/13 Warfarin tablet strength: 2.5 mg

## 2022-04-21 ENCOUNTER — Other Ambulatory Visit: Payer: Self-pay | Admitting: Cardiovascular Disease

## 2022-04-27 DIAGNOSIS — R296 Repeated falls: Secondary | ICD-10-CM | POA: Diagnosis not present

## 2022-04-27 DIAGNOSIS — Z7901 Long term (current) use of anticoagulants: Secondary | ICD-10-CM | POA: Diagnosis not present

## 2022-04-27 DIAGNOSIS — I4821 Permanent atrial fibrillation: Secondary | ICD-10-CM | POA: Diagnosis not present

## 2022-04-27 DIAGNOSIS — E440 Moderate protein-calorie malnutrition: Secondary | ICD-10-CM | POA: Diagnosis not present

## 2022-04-27 DIAGNOSIS — M810 Age-related osteoporosis without current pathological fracture: Secondary | ICD-10-CM | POA: Diagnosis not present

## 2022-04-27 DIAGNOSIS — N390 Urinary tract infection, site not specified: Secondary | ICD-10-CM | POA: Diagnosis not present

## 2022-04-27 DIAGNOSIS — D631 Anemia in chronic kidney disease: Secondary | ICD-10-CM | POA: Diagnosis not present

## 2022-04-27 DIAGNOSIS — Z9181 History of falling: Secondary | ICD-10-CM | POA: Diagnosis not present

## 2022-04-27 DIAGNOSIS — G4761 Periodic limb movement disorder: Secondary | ICD-10-CM | POA: Diagnosis not present

## 2022-04-27 DIAGNOSIS — G8911 Acute pain due to trauma: Secondary | ICD-10-CM | POA: Diagnosis not present

## 2022-04-27 DIAGNOSIS — Z950 Presence of cardiac pacemaker: Secondary | ICD-10-CM | POA: Diagnosis not present

## 2022-04-27 DIAGNOSIS — M25521 Pain in right elbow: Secondary | ICD-10-CM | POA: Diagnosis not present

## 2022-04-27 DIAGNOSIS — N183 Chronic kidney disease, stage 3 unspecified: Secondary | ICD-10-CM | POA: Diagnosis not present

## 2022-04-27 DIAGNOSIS — M25551 Pain in right hip: Secondary | ICD-10-CM | POA: Diagnosis not present

## 2022-05-01 DIAGNOSIS — I4821 Permanent atrial fibrillation: Secondary | ICD-10-CM | POA: Diagnosis not present

## 2022-05-01 DIAGNOSIS — Z9181 History of falling: Secondary | ICD-10-CM | POA: Diagnosis not present

## 2022-05-01 DIAGNOSIS — D631 Anemia in chronic kidney disease: Secondary | ICD-10-CM | POA: Diagnosis not present

## 2022-05-01 DIAGNOSIS — M25521 Pain in right elbow: Secondary | ICD-10-CM | POA: Diagnosis not present

## 2022-05-01 DIAGNOSIS — R296 Repeated falls: Secondary | ICD-10-CM | POA: Diagnosis not present

## 2022-05-01 DIAGNOSIS — M810 Age-related osteoporosis without current pathological fracture: Secondary | ICD-10-CM | POA: Diagnosis not present

## 2022-05-01 DIAGNOSIS — N183 Chronic kidney disease, stage 3 unspecified: Secondary | ICD-10-CM | POA: Diagnosis not present

## 2022-05-01 DIAGNOSIS — Z950 Presence of cardiac pacemaker: Secondary | ICD-10-CM | POA: Diagnosis not present

## 2022-05-01 DIAGNOSIS — G8911 Acute pain due to trauma: Secondary | ICD-10-CM | POA: Diagnosis not present

## 2022-05-01 DIAGNOSIS — Z7901 Long term (current) use of anticoagulants: Secondary | ICD-10-CM | POA: Diagnosis not present

## 2022-05-01 DIAGNOSIS — G4761 Periodic limb movement disorder: Secondary | ICD-10-CM | POA: Diagnosis not present

## 2022-05-01 DIAGNOSIS — E440 Moderate protein-calorie malnutrition: Secondary | ICD-10-CM | POA: Diagnosis not present

## 2022-05-01 DIAGNOSIS — N390 Urinary tract infection, site not specified: Secondary | ICD-10-CM | POA: Diagnosis not present

## 2022-05-01 DIAGNOSIS — M25551 Pain in right hip: Secondary | ICD-10-CM | POA: Diagnosis not present

## 2022-05-04 DIAGNOSIS — R296 Repeated falls: Secondary | ICD-10-CM | POA: Diagnosis not present

## 2022-05-04 DIAGNOSIS — I4821 Permanent atrial fibrillation: Secondary | ICD-10-CM | POA: Diagnosis not present

## 2022-05-04 DIAGNOSIS — D631 Anemia in chronic kidney disease: Secondary | ICD-10-CM | POA: Diagnosis not present

## 2022-05-04 DIAGNOSIS — M25551 Pain in right hip: Secondary | ICD-10-CM | POA: Diagnosis not present

## 2022-05-04 DIAGNOSIS — M25521 Pain in right elbow: Secondary | ICD-10-CM | POA: Diagnosis not present

## 2022-05-04 DIAGNOSIS — G4761 Periodic limb movement disorder: Secondary | ICD-10-CM | POA: Diagnosis not present

## 2022-05-04 DIAGNOSIS — E440 Moderate protein-calorie malnutrition: Secondary | ICD-10-CM | POA: Diagnosis not present

## 2022-05-04 DIAGNOSIS — Z9181 History of falling: Secondary | ICD-10-CM | POA: Diagnosis not present

## 2022-05-04 DIAGNOSIS — Z950 Presence of cardiac pacemaker: Secondary | ICD-10-CM | POA: Diagnosis not present

## 2022-05-04 DIAGNOSIS — M810 Age-related osteoporosis without current pathological fracture: Secondary | ICD-10-CM | POA: Diagnosis not present

## 2022-05-04 DIAGNOSIS — Z7901 Long term (current) use of anticoagulants: Secondary | ICD-10-CM | POA: Diagnosis not present

## 2022-05-04 DIAGNOSIS — N183 Chronic kidney disease, stage 3 unspecified: Secondary | ICD-10-CM | POA: Diagnosis not present

## 2022-05-04 DIAGNOSIS — N390 Urinary tract infection, site not specified: Secondary | ICD-10-CM | POA: Diagnosis not present

## 2022-05-04 DIAGNOSIS — G8911 Acute pain due to trauma: Secondary | ICD-10-CM | POA: Diagnosis not present

## 2022-05-07 DIAGNOSIS — G4761 Periodic limb movement disorder: Secondary | ICD-10-CM | POA: Diagnosis not present

## 2022-05-07 DIAGNOSIS — Z7901 Long term (current) use of anticoagulants: Secondary | ICD-10-CM | POA: Diagnosis not present

## 2022-05-07 DIAGNOSIS — I4821 Permanent atrial fibrillation: Secondary | ICD-10-CM | POA: Diagnosis not present

## 2022-05-07 DIAGNOSIS — N390 Urinary tract infection, site not specified: Secondary | ICD-10-CM | POA: Diagnosis not present

## 2022-05-07 DIAGNOSIS — M810 Age-related osteoporosis without current pathological fracture: Secondary | ICD-10-CM | POA: Diagnosis not present

## 2022-05-07 DIAGNOSIS — E440 Moderate protein-calorie malnutrition: Secondary | ICD-10-CM | POA: Diagnosis not present

## 2022-05-07 DIAGNOSIS — M25551 Pain in right hip: Secondary | ICD-10-CM | POA: Diagnosis not present

## 2022-05-07 DIAGNOSIS — N183 Chronic kidney disease, stage 3 unspecified: Secondary | ICD-10-CM | POA: Diagnosis not present

## 2022-05-07 DIAGNOSIS — G8911 Acute pain due to trauma: Secondary | ICD-10-CM | POA: Diagnosis not present

## 2022-05-07 DIAGNOSIS — D631 Anemia in chronic kidney disease: Secondary | ICD-10-CM | POA: Diagnosis not present

## 2022-05-07 DIAGNOSIS — R296 Repeated falls: Secondary | ICD-10-CM | POA: Diagnosis not present

## 2022-05-07 DIAGNOSIS — M25521 Pain in right elbow: Secondary | ICD-10-CM | POA: Diagnosis not present

## 2022-05-07 DIAGNOSIS — Z9181 History of falling: Secondary | ICD-10-CM | POA: Diagnosis not present

## 2022-05-07 DIAGNOSIS — Z950 Presence of cardiac pacemaker: Secondary | ICD-10-CM | POA: Diagnosis not present

## 2022-05-10 ENCOUNTER — Ambulatory Visit: Payer: Medicare Other | Admitting: *Deleted

## 2022-05-10 DIAGNOSIS — Z952 Presence of prosthetic heart valve: Secondary | ICD-10-CM

## 2022-05-10 DIAGNOSIS — Z5181 Encounter for therapeutic drug level monitoring: Secondary | ICD-10-CM

## 2022-05-10 LAB — POCT INR: INR: 2.2 (ref 2.0–3.0)

## 2022-05-10 NOTE — Patient Instructions (Signed)
Continue warfarin 2 tablets daily except 1 tablet on Mondays and Thursdays Recheck in 4 weeks

## 2022-05-18 DIAGNOSIS — Z9181 History of falling: Secondary | ICD-10-CM | POA: Diagnosis not present

## 2022-05-18 DIAGNOSIS — N390 Urinary tract infection, site not specified: Secondary | ICD-10-CM | POA: Diagnosis not present

## 2022-05-18 DIAGNOSIS — N183 Chronic kidney disease, stage 3 unspecified: Secondary | ICD-10-CM | POA: Diagnosis not present

## 2022-05-18 DIAGNOSIS — I4821 Permanent atrial fibrillation: Secondary | ICD-10-CM | POA: Diagnosis not present

## 2022-05-18 DIAGNOSIS — Z7901 Long term (current) use of anticoagulants: Secondary | ICD-10-CM | POA: Diagnosis not present

## 2022-05-18 DIAGNOSIS — M25551 Pain in right hip: Secondary | ICD-10-CM | POA: Diagnosis not present

## 2022-05-18 DIAGNOSIS — M810 Age-related osteoporosis without current pathological fracture: Secondary | ICD-10-CM | POA: Diagnosis not present

## 2022-05-18 DIAGNOSIS — Z950 Presence of cardiac pacemaker: Secondary | ICD-10-CM | POA: Diagnosis not present

## 2022-05-18 DIAGNOSIS — D631 Anemia in chronic kidney disease: Secondary | ICD-10-CM | POA: Diagnosis not present

## 2022-05-18 DIAGNOSIS — G4761 Periodic limb movement disorder: Secondary | ICD-10-CM | POA: Diagnosis not present

## 2022-05-18 DIAGNOSIS — G8911 Acute pain due to trauma: Secondary | ICD-10-CM | POA: Diagnosis not present

## 2022-05-18 DIAGNOSIS — R296 Repeated falls: Secondary | ICD-10-CM | POA: Diagnosis not present

## 2022-05-18 DIAGNOSIS — E440 Moderate protein-calorie malnutrition: Secondary | ICD-10-CM | POA: Diagnosis not present

## 2022-05-18 DIAGNOSIS — M25521 Pain in right elbow: Secondary | ICD-10-CM | POA: Diagnosis not present

## 2022-05-21 DIAGNOSIS — R296 Repeated falls: Secondary | ICD-10-CM | POA: Diagnosis not present

## 2022-05-21 DIAGNOSIS — M25521 Pain in right elbow: Secondary | ICD-10-CM | POA: Diagnosis not present

## 2022-05-21 DIAGNOSIS — M810 Age-related osteoporosis without current pathological fracture: Secondary | ICD-10-CM | POA: Diagnosis not present

## 2022-05-21 DIAGNOSIS — M25551 Pain in right hip: Secondary | ICD-10-CM | POA: Diagnosis not present

## 2022-05-21 DIAGNOSIS — N390 Urinary tract infection, site not specified: Secondary | ICD-10-CM | POA: Diagnosis not present

## 2022-05-21 DIAGNOSIS — E440 Moderate protein-calorie malnutrition: Secondary | ICD-10-CM | POA: Diagnosis not present

## 2022-05-21 DIAGNOSIS — Z950 Presence of cardiac pacemaker: Secondary | ICD-10-CM | POA: Diagnosis not present

## 2022-05-21 DIAGNOSIS — Z7901 Long term (current) use of anticoagulants: Secondary | ICD-10-CM | POA: Diagnosis not present

## 2022-05-21 DIAGNOSIS — I4821 Permanent atrial fibrillation: Secondary | ICD-10-CM | POA: Diagnosis not present

## 2022-05-21 DIAGNOSIS — N183 Chronic kidney disease, stage 3 unspecified: Secondary | ICD-10-CM | POA: Diagnosis not present

## 2022-05-21 DIAGNOSIS — D631 Anemia in chronic kidney disease: Secondary | ICD-10-CM | POA: Diagnosis not present

## 2022-05-21 DIAGNOSIS — G4761 Periodic limb movement disorder: Secondary | ICD-10-CM | POA: Diagnosis not present

## 2022-05-21 DIAGNOSIS — G8911 Acute pain due to trauma: Secondary | ICD-10-CM | POA: Diagnosis not present

## 2022-05-21 DIAGNOSIS — Z9181 History of falling: Secondary | ICD-10-CM | POA: Diagnosis not present

## 2022-05-30 DIAGNOSIS — I4821 Permanent atrial fibrillation: Secondary | ICD-10-CM | POA: Diagnosis not present

## 2022-05-30 DIAGNOSIS — G8911 Acute pain due to trauma: Secondary | ICD-10-CM | POA: Diagnosis not present

## 2022-05-30 DIAGNOSIS — M810 Age-related osteoporosis without current pathological fracture: Secondary | ICD-10-CM | POA: Diagnosis not present

## 2022-05-30 DIAGNOSIS — Z7901 Long term (current) use of anticoagulants: Secondary | ICD-10-CM | POA: Diagnosis not present

## 2022-05-30 DIAGNOSIS — N390 Urinary tract infection, site not specified: Secondary | ICD-10-CM | POA: Diagnosis not present

## 2022-05-30 DIAGNOSIS — Z9181 History of falling: Secondary | ICD-10-CM | POA: Diagnosis not present

## 2022-05-30 DIAGNOSIS — M25551 Pain in right hip: Secondary | ICD-10-CM | POA: Diagnosis not present

## 2022-05-30 DIAGNOSIS — G4761 Periodic limb movement disorder: Secondary | ICD-10-CM | POA: Diagnosis not present

## 2022-05-30 DIAGNOSIS — R296 Repeated falls: Secondary | ICD-10-CM | POA: Diagnosis not present

## 2022-05-30 DIAGNOSIS — N183 Chronic kidney disease, stage 3 unspecified: Secondary | ICD-10-CM | POA: Diagnosis not present

## 2022-05-30 DIAGNOSIS — D631 Anemia in chronic kidney disease: Secondary | ICD-10-CM | POA: Diagnosis not present

## 2022-05-30 DIAGNOSIS — E440 Moderate protein-calorie malnutrition: Secondary | ICD-10-CM | POA: Diagnosis not present

## 2022-05-30 DIAGNOSIS — M25521 Pain in right elbow: Secondary | ICD-10-CM | POA: Diagnosis not present

## 2022-05-30 DIAGNOSIS — Z950 Presence of cardiac pacemaker: Secondary | ICD-10-CM | POA: Diagnosis not present

## 2022-06-01 DIAGNOSIS — M25521 Pain in right elbow: Secondary | ICD-10-CM | POA: Diagnosis not present

## 2022-06-01 DIAGNOSIS — Z9181 History of falling: Secondary | ICD-10-CM | POA: Diagnosis not present

## 2022-06-01 DIAGNOSIS — G8911 Acute pain due to trauma: Secondary | ICD-10-CM | POA: Diagnosis not present

## 2022-06-01 DIAGNOSIS — G4761 Periodic limb movement disorder: Secondary | ICD-10-CM | POA: Diagnosis not present

## 2022-06-01 DIAGNOSIS — M810 Age-related osteoporosis without current pathological fracture: Secondary | ICD-10-CM | POA: Diagnosis not present

## 2022-06-01 DIAGNOSIS — R296 Repeated falls: Secondary | ICD-10-CM | POA: Diagnosis not present

## 2022-06-01 DIAGNOSIS — N183 Chronic kidney disease, stage 3 unspecified: Secondary | ICD-10-CM | POA: Diagnosis not present

## 2022-06-01 DIAGNOSIS — Z950 Presence of cardiac pacemaker: Secondary | ICD-10-CM | POA: Diagnosis not present

## 2022-06-01 DIAGNOSIS — M25551 Pain in right hip: Secondary | ICD-10-CM | POA: Diagnosis not present

## 2022-06-01 DIAGNOSIS — N390 Urinary tract infection, site not specified: Secondary | ICD-10-CM | POA: Diagnosis not present

## 2022-06-01 DIAGNOSIS — D631 Anemia in chronic kidney disease: Secondary | ICD-10-CM | POA: Diagnosis not present

## 2022-06-01 DIAGNOSIS — Z7901 Long term (current) use of anticoagulants: Secondary | ICD-10-CM | POA: Diagnosis not present

## 2022-06-01 DIAGNOSIS — I4821 Permanent atrial fibrillation: Secondary | ICD-10-CM | POA: Diagnosis not present

## 2022-06-01 DIAGNOSIS — E440 Moderate protein-calorie malnutrition: Secondary | ICD-10-CM | POA: Diagnosis not present

## 2022-06-04 DIAGNOSIS — M25551 Pain in right hip: Secondary | ICD-10-CM | POA: Diagnosis not present

## 2022-06-04 DIAGNOSIS — E440 Moderate protein-calorie malnutrition: Secondary | ICD-10-CM | POA: Diagnosis not present

## 2022-06-04 DIAGNOSIS — M810 Age-related osteoporosis without current pathological fracture: Secondary | ICD-10-CM | POA: Diagnosis not present

## 2022-06-04 DIAGNOSIS — R296 Repeated falls: Secondary | ICD-10-CM | POA: Diagnosis not present

## 2022-06-04 DIAGNOSIS — Z950 Presence of cardiac pacemaker: Secondary | ICD-10-CM | POA: Diagnosis not present

## 2022-06-04 DIAGNOSIS — N390 Urinary tract infection, site not specified: Secondary | ICD-10-CM | POA: Diagnosis not present

## 2022-06-04 DIAGNOSIS — Z9181 History of falling: Secondary | ICD-10-CM | POA: Diagnosis not present

## 2022-06-04 DIAGNOSIS — G4761 Periodic limb movement disorder: Secondary | ICD-10-CM | POA: Diagnosis not present

## 2022-06-04 DIAGNOSIS — N183 Chronic kidney disease, stage 3 unspecified: Secondary | ICD-10-CM | POA: Diagnosis not present

## 2022-06-04 DIAGNOSIS — G8911 Acute pain due to trauma: Secondary | ICD-10-CM | POA: Diagnosis not present

## 2022-06-04 DIAGNOSIS — D631 Anemia in chronic kidney disease: Secondary | ICD-10-CM | POA: Diagnosis not present

## 2022-06-04 DIAGNOSIS — M25521 Pain in right elbow: Secondary | ICD-10-CM | POA: Diagnosis not present

## 2022-06-04 DIAGNOSIS — I4821 Permanent atrial fibrillation: Secondary | ICD-10-CM | POA: Diagnosis not present

## 2022-06-04 DIAGNOSIS — Z7901 Long term (current) use of anticoagulants: Secondary | ICD-10-CM | POA: Diagnosis not present

## 2022-06-07 ENCOUNTER — Ambulatory Visit (INDEPENDENT_AMBULATORY_CARE_PROVIDER_SITE_OTHER): Payer: Medicare Other | Admitting: *Deleted

## 2022-06-07 DIAGNOSIS — Z952 Presence of prosthetic heart valve: Secondary | ICD-10-CM | POA: Diagnosis not present

## 2022-06-07 DIAGNOSIS — Z5181 Encounter for therapeutic drug level monitoring: Secondary | ICD-10-CM

## 2022-06-07 DIAGNOSIS — I4891 Unspecified atrial fibrillation: Secondary | ICD-10-CM | POA: Diagnosis not present

## 2022-06-07 LAB — POCT INR: INR: 1.9 — AB (ref 2.0–3.0)

## 2022-06-07 NOTE — Patient Instructions (Signed)
Take warfarin 1 1/2 tablets tonight then resume 2 tablets daily except 1 tablet on Mondays and Thursdays Recheck in 4 weeks

## 2022-06-26 ENCOUNTER — Ambulatory Visit (HOSPITAL_COMMUNITY): Payer: Medicare Other | Attending: Cardiology

## 2022-06-26 DIAGNOSIS — I4821 Permanent atrial fibrillation: Secondary | ICD-10-CM

## 2022-06-26 DIAGNOSIS — Z952 Presence of prosthetic heart valve: Secondary | ICD-10-CM

## 2022-06-26 LAB — ECHOCARDIOGRAM COMPLETE
AV Mean grad: 19.5 mmHg
AV Peak grad: 36.7 mmHg
Ao pk vel: 3.03 m/s
Area-P 1/2: 3.81 cm2
P 1/2 time: 407 msec
S' Lateral: 2.8 cm

## 2022-06-27 ENCOUNTER — Telehealth: Payer: Self-pay | Admitting: Cardiovascular Disease

## 2022-06-27 ENCOUNTER — Ambulatory Visit (INDEPENDENT_AMBULATORY_CARE_PROVIDER_SITE_OTHER): Payer: Medicare Other

## 2022-06-27 DIAGNOSIS — I4891 Unspecified atrial fibrillation: Secondary | ICD-10-CM | POA: Diagnosis not present

## 2022-06-27 NOTE — Telephone Encounter (Signed)
  1. Has your device fired? No   2. Is you device beeping? no  3. Are you experiencing draining or swelling at device site? no  4. Are you calling to see if we received your device transmission? yes  5. Have you passed out? no    Please route to [place]

## 2022-06-27 NOTE — Telephone Encounter (Signed)
I LMOVM letting patient know we did receive her remote transmission. If she has any questions to call the device clinic number.

## 2022-06-28 LAB — CUP PACEART REMOTE DEVICE CHECK
Battery Impedance: 2383 Ohm
Battery Remaining Longevity: 31 mo
Battery Voltage: 2.74 V
Brady Statistic RV Percent Paced: 99 %
Date Time Interrogation Session: 20230823154423
Implantable Lead Implant Date: 20110701
Implantable Lead Implant Date: 20110701
Implantable Lead Location: 753859
Implantable Lead Location: 753860
Implantable Lead Model: 5076
Implantable Lead Model: 5076
Implantable Pulse Generator Implant Date: 20110701
Lead Channel Impedance Value: 515 Ohm
Lead Channel Impedance Value: 67 Ohm
Lead Channel Pacing Threshold Amplitude: 0.75 V
Lead Channel Pacing Threshold Pulse Width: 0.4 ms
Lead Channel Setting Pacing Amplitude: 2.5 V
Lead Channel Setting Pacing Pulse Width: 0.4 ms
Lead Channel Setting Sensing Sensitivity: 2.8 mV

## 2022-06-29 ENCOUNTER — Encounter: Payer: Self-pay | Admitting: Cardiovascular Disease

## 2022-06-29 ENCOUNTER — Ambulatory Visit (INDEPENDENT_AMBULATORY_CARE_PROVIDER_SITE_OTHER): Payer: Medicare Other | Admitting: Cardiovascular Disease

## 2022-06-29 VITALS — BP 132/74 | HR 71 | Ht 64.0 in | Wt 110.6 lb

## 2022-06-29 DIAGNOSIS — I4729 Other ventricular tachycardia: Secondary | ICD-10-CM | POA: Diagnosis not present

## 2022-06-29 DIAGNOSIS — Z7901 Long term (current) use of anticoagulants: Secondary | ICD-10-CM

## 2022-06-29 DIAGNOSIS — Z952 Presence of prosthetic heart valve: Secondary | ICD-10-CM

## 2022-06-29 DIAGNOSIS — I350 Nonrheumatic aortic (valve) stenosis: Secondary | ICD-10-CM | POA: Diagnosis not present

## 2022-06-29 DIAGNOSIS — I10 Essential (primary) hypertension: Secondary | ICD-10-CM | POA: Diagnosis not present

## 2022-06-29 DIAGNOSIS — Z950 Presence of cardiac pacemaker: Secondary | ICD-10-CM

## 2022-06-29 DIAGNOSIS — R296 Repeated falls: Secondary | ICD-10-CM

## 2022-06-29 DIAGNOSIS — I4821 Permanent atrial fibrillation: Secondary | ICD-10-CM | POA: Diagnosis not present

## 2022-06-29 DIAGNOSIS — R636 Underweight: Secondary | ICD-10-CM

## 2022-06-29 NOTE — Patient Instructions (Signed)
Medication Instructions:  No changes *If you need a refill on your cardiac medications before your next appointment, please call your pharmacy*   Lab Work: None ordered If you have labs (blood work) drawn today and your tests are completely normal, you will receive your results only by: [place] (if you have MyChart) OR A paper copy in the mail If you have any lab test that is abnormal or we need to change your treatment, we will call you to review the results.   Testing/Procedures: None ordered   Follow-Up: At [facility], you and your health needs are our priority.  As part of our continuing mission to provide you with exceptional heart care, we have created designated Provider Care Teams.  These Care Teams include your primary Cardiologist (physician) and Advanced Practice Providers (APPs -  Physician Assistants and Nurse Practitioners) who all work together to provide you with the care you need, when you need it.  We recommend signing up for the patient portal called "MyChart".  Sign up information is provided on this After Visit Summary.  MyChart is used to connect with patients for Virtual Visits (Telemedicine).  Patients are able to view lab/test results, encounter notes, upcoming appointments, etc.  Non-urgent messages can be sent to your provider as well.   To learn more about what you can do with MyChart, go to [email or web address].    Your next appointment:   12 month(s)  The format for your next appointment:   In Person  Provider:   Dr. Sallyanne Kuster

## 2022-06-29 NOTE — Progress Notes (Signed)
Patient ID: Alice Ballard, female   DOB: June 17, 1941, 81 y.o.   MRN: [id number]    Cardiology Office Note    Date:  06/29/2022   ID:  Alice Ballard, DOB 15-May-1941, MRN [id number]  PCP:  Curlene Labrum, MD  Cardiologist:   Sanda Klein, MD   Chief Complaint  Patient presents with   Cardiac Valve Problem    History of Present Illness:  Alice Ballard is a 81 y.o. 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   She has had no heart problems over the last year, but continues to lose weight at an alarming pace.  She has lost about 30 pounds in the last 3 years.  She had a fall in May, thankfully without serious injury.  She is now using her walker more consistently.  She has not had any serious bleeding problems.  Her appetite is poor and her preferred diet is not particularly nutritious, high in sugars and starches without much in the way of protein.  Denies palpitations, syncope, overt bleeding, focal neurological events, orthopnea, PND, edema or chest pain either at rest or with activity.  She is compliant with warfarin anticoagulation and prothrombin time follow-up, has only been subtherapeutic once in the last 12 months (earlier this month at 1.9) and has only had 2 supratherapeutic episodes in the last 12 months.  Pacemaker interrogation shows normal device function.  She has atrial fibrillation with slow ventricular response and with a lower rate limit set at 50 bpm she has 99% ventricular pacing.  She has rare episodes of RVR and rare episodes of very brief nonsustained ventricular tachycardia.  The longest episode of high ventricular rate is under 5 seconds in duration.  Estimated generator longevity is 31 months.  The  echocardiogram performed 06/26/2022 shows similar mildly decreased left ventricular ejection fraction of 40-45% (last year 45-50%) and stable gradients across both the aortic and mitral valve prostheses (she has always had a mildly increased aortic valve mean gradient at about 15-20 mmHg, likely a degree of prosthesis mismatch).  Alice Ballard is a 81 year old woman with a mechanical aortic and mitral valve prostheses as well as long-term persistent atrial fibrillation, hypertension, dyslipidemia. In 1986 her mitral valve was replaced with a 29 mm St. Jude prosthesis for rheumatic disease. In 1994 her aortic valve was replaced with a 19 mm St. Jude device. Has chronic moderate elevation in the valvular gradients across the aortic valve, likely an expression of patient-valve mismatch. Fluoroscopy has shown normal disc motion. Previous right heart catheterization had shown only very mild pulmonary hypertension (mean PA pressure 25 mm Hg, wedge pressure 16 mm Hg) She has no history of coronary artery disease. She has a dual-chamber permanent pacemaker (Medtronic) that was implanted in 2011 for tachycardia-bradycardia syndrome, now programmed VVIR, virtually 100% V paced. She is on chronic warfarin anticoagulation both for her mechanical valves and for atrial fibrillation. She has not had serious bleeding complications or embolic events. She does not have a history of stroke.  Past Medical History:  Diagnosis Date   Abnormality of gait 07/04/2016   Cerebral vascular disease    Coronary artery disease    Excessive daytime sleepiness 07/04/2016   Fatty liver    Gait abnormality    Gastric polyp    GERD (gastroesophageal reflux disease)  Hemorrhoids    Hiatal hernia    Hyperlipidemia    Hypersomnolence    Hypertension    Pacemaker july 2011   medtronic adapta model # ADDRL 1,SERIAL [phone]   Paroxysmal atrial fibrillation [facility])    Seizure disorder [facility])    Sick sinus syndrome ([employer])    Spinal stenosis     Tubular adenoma of colon 11/2010   UTI (urinary tract infection)     Past Surgical History:  Procedure Laterality Date   AORTIC AND MITRAL VALVE REPLACEMENT  1994   40m St. Jude   BACK SURGERY     CARDIAC CATHETERIZATION  04/2010   DOPPLER ECHOCARDIOGRAPHY  sept 06, 2013   aotric valve grad. peak 51 mmHg and mean 28 mm Hg  dimensional subtractive index 0.26; mitral valve grad peak 25 and mean 754mHg norm pressure halftime 4434m.   MITRAL VALVE REPLACEMENT  1986   St Jude prosthesis    NM MYOCAR PERF WALL MOTION  03/31/2008   EF 69% low risk   NM MYOCAR PERF WALL MOTION  04/19/2006   EF 69%   PACEMAKER PLACEMENT  05/2010   TEE WITHOUT CARDIOVERSION  07/11/2012   Procedure: TRANSESOPHAGEAL ECHOCARDIOGRAM (TEE);  Surgeon: MihSanda KleinD;  [facility];  Service: Cardiovascular;  Laterality: N/A;    Outpatient Medications Prior to Visit  Medication Sig Dispense Refill   acetaminophen (TYLENOL) 500 MG tablet Take 1,000 mg by mouth every 6 (six) hours as needed. For pain     alendronate (FOSAMAX) 70 MG tablet Take 70 mg by mouth every 7 (seven) days. Take with a full glass of water on an empty stomach. Takes on Sunday.     Ascorbic Acid (VITAMIN C) 1000 MG tablet Take 1,000 mg by mouth daily.     atorvastatin (LIPITOR) 40 MG tablet TAKE 1 TABLET BY MOUTH DAILY (Please make APPOINTMENT FOR refills) 90 tablet 1   Cholecalciferol (VITAMIN D3) 2000 UNITS capsule Take 2,000 Units by mouth daily.     dorzolamide (TRUSOPT) 2 % ophthalmic solution Place 1 drop into the right eye 2 (two) times daily.     latanoprost (XALATAN) 0.005 % ophthalmic solution Place 1 drop into both eyes at bedtime.     levETIRAcetam (KEPPRA) 500 MG tablet Take 500 mg by mouth 2 (two) times daily.     lisinopril (ZESTRIL) 30 MG tablet TAKE 1 TABLET BY MOUTH EVERY DAY 90 tablet 2   vitamin B-12 (CYANOCOBALAMIN) 1000 MCG tablet Take 1,000 mcg by mouth daily.     warfarin (COUMADIN) 2.5 MG tablet TAKE 1 TO 2  TABLETS BY MOUTH DAILY AS DIRECTED by coumadin clinic 60 tablet 2   chlorthalidone (HYGROTON) 25 MG tablet Take 0.5 tablets (12.5 mg total) by mouth daily. 45 tablet 3   fluticasone (FLONASE) 50 MCG/ACT nasal spray Place 2 sprays into both nostrils 2 (two) times daily as needed.      folic acid (FOLVITE) 1 MG tablet TAKE ONE TABLET BY MOUTH EVERY DAY 90 tablet 0   omeprazole (PRILOSEC) 20 MG capsule Take 20 mg by mouth daily as needed.  (Patient not taking: Reported on 06/29/2022)     Polyethyl Glycol-Propyl Glycol (SYSTANE OP) Place 1 drop into both eyes daily as needed. For dry eyes (Patient not taking: Reported on 06/29/2022)     No facility-administered medications prior to visit.     Allergies:   Shellfish allergy and Tegretol [carbamazepine]   Social History   Socioeconomic History   Marital  status: Married    Spouse name: Not on file   Number of children: 1   Years of education: Not on file   Highest education level: Not on file  Occupational History   Occupation: DISABLED  Tobacco Use   Smoking status: Never   Smokeless tobacco: Never  Substance and Sexual Activity   Alcohol use: No   Drug use: No   Sexual activity: Not on file  Other Topics Concern   Not on file  Social History Narrative   Lives at home with her husband.   Right-handed.   16-20oz of caffeine per day.   Social Determinants of Health   Financial Resource Strain: Not on file  Food Insecurity: Not on file  Transportation Needs: Not on file  Physical Activity: Not on file  Stress: Not on file  Social Connections: Not on file     Family History:  The patient's family history includes Colon cancer in her sister; Colon polyps in her sister; Heart disease in her mother.   ROS:   Please see the history of present illness.    ROS All other systems are reviewed and are negative.   PHYSICAL EXAM:   VS:  BP 132/74 (BP Location: Left Arm, Patient Position: Sitting, Cuff Size: Small)   Pulse 71   Ht 5'  4" (1.626 m)   Wt 110 lb 9.6 oz (50.2 kg)   SpO2 98%   BMI 18.98 kg/m      General: Alert, oriented x3, no distress, Underweight.  Healthy, prominent pacemaker site in left subclavian area following weight loss. Head: no evidence of trauma, PERRL, EOMI, no exophtalmos or lid lag, no myxedema, no xanthelasma; normal ears, nose and oropharynx Neck: normal jugular venous pulsations and no hepatojugular reflux; brisk carotid pulses without delay and no carotid bruits Chest: clear to auscultation, no signs of consolidation by percussion or palpation, normal fremitus, symmetrical and full respiratory excursions Cardiovascular: normal position and quality of the apical impulse, regular rhythm, crisp mechanical valve clicks, no diastolic murmurs, early peaking 1-2/6 aortic ejection murmur, no rubs or gallops Abdomen: no tenderness or distention, no masses by palpation, no abnormal pulsatility or arterial bruits, normal bowel sounds, no hepatosplenomegaly Extremities: no clubbing, cyanosis or edema; 2+ radial, ulnar and brachial pulses bilaterally; 2+ right femoral, posterior tibial and dorsalis pedis pulses; 2+ left femoral, posterior tibial and dorsalis pedis pulses; no subclavian or femoral bruits Neurological: grossly nonfocal Psych: Normal mood and affect   Wt Readings from Last 3 Encounters:  06/29/22 110 lb 9.6 oz (50.2 kg)  06/26/21 123 lb (55.8 kg)  06/13/20 134 lb 9.6 oz (61.1 kg)      Studies/Labs Reviewed:   Echocardiogram 06/26/2022    1. Left ventricular ejection fraction, by estimation, is 40 to 45%. The  left ventricle has mildly decreased function. The left ventricle  demonstrates global hypokinesis. Left ventricular diastolic function could  not be evaluated. Elevated left  ventricular end-diastolic pressure.   2. Right ventricular systolic function is mildly reduced. The right  ventricular size is normal. There is mildly elevated pulmonary artery  systolic pressure. The  estimated right ventricular systolic pressure is  61.4 mmHg.   3. Left atrial size was moderately dilated.   4. Right atrial size was severely dilated.   5. The mitral valve has been repaired/replaced. There is a St. Jude  mechanical valve present in the mitral position.      Mitral regurgitation cannot be assessed due to LA shadowing from  mechanical MVR. No evidence of mitral stenosis. The mean mitral valve  gradient is 3.0 mmHg.   6. Tricuspid valve regurgitation is moderate.   7. The aortic valve has been repaired/replaced. Aortic valve  regurgitation is mild. No aortic stenosis is present. There is a St. Jude  mechanical valve present in the aortic position. Aortic valve mean  gradient measures 19.5 mmHg. Aortic valve Vmax  measures 3.03 m/s.   8. Aortic dilatation noted. There is mild dilatation of the ascending  aorta, measuring 41 mm. There is mild dilatation of the aortic root,  measuring 39 mm.   9. The inferior vena cava is dilated in size with <50% respiratory  variability, suggesting right atrial pressure of 15 mmHg.   Comparison(s): 06/29/20 EF 45-50%. MV 81mHg mean PG, 138mg peak PG. AV  1738m mean PG, 44m9mpeak PG.   EKG:  EKG is ordered today.  There is background atrial fibrillation and ventricular paced rhythm, distinct positive R wave in leads V1-V2, suggesting possible epicardial location of the electrode  Recent Labs: December 29, 2020 Cholesterol 136, HDL 57, LDL 62, triglycerides 87  Mar 29, 2021 Hemoglobin 12.0, creatinine 0.78, ALT 14, TSH 3.19  Mar 28, 2022 Cholesterol 151, HDL 73, LDL 62, triglycerides 86 Hemoglobin 12.5, creatinine 0.8, potassium 4.2, ALT 20 TSH 1.78 on 12/28/2021  ASSESSMENT:    1. Permanent atrial fibrillation (HCC)[place]2. Pacemaker   3. NSVT (nonsustained ventricular tachycardia) (HCC)[place]4. Hx of mechanical aortic valve replacement   5. Moderate aortic stenosis   6. History of mitral valve replacement, mechanical   7.  Warfarin anticoagulation   8. Essential hypertension   9. Underweight   10. Falls frequently      PLAN:  In order of problems listed above:  Permanent atrial fibrillation: With slow ventricular response and virtually 100% ventricular pacing with appropriate heart rate histogram distribution.  On anticoagulation for mechanical heart valves. PPM: She is not pacemaker dependent.  Continue remote downloads every 3 months. NSVT: As always, she has a few brief asymptomatic episodes of nonsustained ventricular tachycardia up to 2 or 3 times a month.  This pattern has been present for several years. Mechanical AVR: As before, has a small degree of patient prosthesis mismatch with a mean gradient that is now 20 mmHg or so.  Dimensionless index is substantially lower this year at 0.25 raising concern for true aortic stenosis.  She is by no means a candidate for redo surgery.  She does not have symptoms of aortic stenosis.  She is aware of the need for endocarditis prophylaxis. Mechanical MVR: Normal prosthetic valve function. Warfarin: Mildly subtherapeutic recently.  No active bleeding. HTN: Well-controlled.  It appears she is no longer taking chlorthalidone but her blood pressure is well controlled on lisinopril monotherapy, likely due to her substantial weight loss. Weight loss: Appetite is poor.  Suggested highly nutritious food supplements like Ensure, boost, Carnation instant breakfast.  She does not drink milk or like to eat eggs for most types of cheese.  She eats a little bit of chicken.  Her diet mostly consists of sodas, orange juice, mashed potatoes. Falls: I encouraged her to use her walker consistently.  Any falls with head impact should be evaluated probably due to anticoagulation.   Medication Adjustments/Labs and Tests Ordered: Current medicines are reviewed at length with the patient today.  Concerns regarding medicines are outlined above.  Medication changes, Labs and Tests ordered  today are listed in the Patient  Instructions below. Patient Instructions  Medication Instructions:  No changes *If you need a refill on your cardiac medications before your next appointment, please call your pharmacy*   Lab Work: None ordered If you have labs (blood work) drawn today and your tests are completely normal, you will receive your results only by: [place] (if you have MyChart) OR A paper copy in the mail If you have any lab test that is abnormal or we need to change your treatment, we will call you to review the results.   Testing/Procedures: None ordered   Follow-Up: At [facility], you and your health needs are our priority.  As part of our continuing mission to provide you with exceptional heart care, we have created designated Provider Care Teams.  These Care Teams include your primary Cardiologist (physician) and Advanced Practice Providers (APPs -  Physician Assistants and Nurse Practitioners) who all work together to provide you with the care you need, when you need it.  We recommend signing up for the patient portal called "MyChart".  Sign up information is provided on this After Visit Summary.  MyChart is used to connect with patients for Virtual Visits (Telemedicine).  Patients are able to view lab/test results, encounter notes, upcoming appointments, etc.  Non-urgent messages can be sent to your provider as well.   To learn more about what you can do with MyChart, go to [email or web address].    Your next appointment:   12 month(s)  The format for your next appointment:   In Person  Provider:   Dr. Sallyanne Kuster       Signed, Sanda Klein, MD  06/29/2022 8:40 AM    [address], [place], [place]  [address] Phone: [phone]; Fax: [phone]

## 2022-07-05 ENCOUNTER — Ambulatory Visit: Payer: Medicare Other | Attending: Cardiology | Admitting: *Deleted

## 2022-07-05 DIAGNOSIS — E440 Moderate protein-calorie malnutrition: Secondary | ICD-10-CM | POA: Diagnosis not present

## 2022-07-05 DIAGNOSIS — I4821 Permanent atrial fibrillation: Secondary | ICD-10-CM | POA: Diagnosis not present

## 2022-07-05 DIAGNOSIS — Z952 Presence of prosthetic heart valve: Secondary | ICD-10-CM | POA: Diagnosis not present

## 2022-07-05 DIAGNOSIS — Z5181 Encounter for therapeutic drug level monitoring: Secondary | ICD-10-CM | POA: Diagnosis not present

## 2022-07-05 DIAGNOSIS — R296 Repeated falls: Secondary | ICD-10-CM | POA: Diagnosis not present

## 2022-07-05 DIAGNOSIS — I10 Essential (primary) hypertension: Secondary | ICD-10-CM | POA: Diagnosis not present

## 2022-07-05 DIAGNOSIS — I4891 Unspecified atrial fibrillation: Secondary | ICD-10-CM | POA: Diagnosis not present

## 2022-07-05 DIAGNOSIS — D696 Thrombocytopenia, unspecified: Secondary | ICD-10-CM | POA: Diagnosis not present

## 2022-07-05 DIAGNOSIS — E7849 Other hyperlipidemia: Secondary | ICD-10-CM | POA: Diagnosis not present

## 2022-07-05 DIAGNOSIS — R634 Abnormal weight loss: Secondary | ICD-10-CM | POA: Diagnosis not present

## 2022-07-05 DIAGNOSIS — R2681 Unsteadiness on feet: Secondary | ICD-10-CM | POA: Diagnosis not present

## 2022-07-05 DIAGNOSIS — Z950 Presence of cardiac pacemaker: Secondary | ICD-10-CM | POA: Diagnosis not present

## 2022-07-05 DIAGNOSIS — Z6821 Body mass index (BMI) 21.0-21.9, adult: Secondary | ICD-10-CM | POA: Diagnosis not present

## 2022-07-05 LAB — POCT INR: INR: 1.6 — AB (ref 2.0–3.0)

## 2022-07-05 NOTE — Patient Instructions (Signed)
Take warfarin 2 tablets today then increase dose to 2 tablets daily except 1 tablet on Mondays  Recheck in 2 weeks

## 2022-07-17 ENCOUNTER — Other Ambulatory Visit: Payer: Self-pay | Admitting: Cardiovascular Disease

## 2022-07-17 DIAGNOSIS — I4891 Unspecified atrial fibrillation: Secondary | ICD-10-CM

## 2022-07-19 ENCOUNTER — Ambulatory Visit: Payer: Medicare Other | Attending: Cardiology | Admitting: *Deleted

## 2022-07-19 DIAGNOSIS — Z5181 Encounter for therapeutic drug level monitoring: Secondary | ICD-10-CM | POA: Diagnosis not present

## 2022-07-19 DIAGNOSIS — Z952 Presence of prosthetic heart valve: Secondary | ICD-10-CM | POA: Diagnosis not present

## 2022-07-19 LAB — POCT INR: INR: 1.5 — AB (ref 2.0–3.0)

## 2022-07-19 NOTE — Patient Instructions (Signed)
Take warfarin 2 extra tablets today (took 1 this am) then increase dose to 2 tablets daily Recheck in 2 weeks

## 2022-07-24 NOTE — Progress Notes (Signed)
Remote pacemaker transmission.   

## 2022-08-06 ENCOUNTER — Ambulatory Visit: Payer: Medicare Other | Attending: Cardiology | Admitting: *Deleted

## 2022-08-06 DIAGNOSIS — Z952 Presence of prosthetic heart valve: Secondary | ICD-10-CM | POA: Diagnosis not present

## 2022-08-06 DIAGNOSIS — Z5181 Encounter for therapeutic drug level monitoring: Secondary | ICD-10-CM | POA: Diagnosis not present

## 2022-08-06 LAB — POCT INR: INR: 1.7 — AB (ref 2.0–3.0)

## 2022-08-06 NOTE — Patient Instructions (Signed)
Increase warfarin to 2 tablets daily except 3 tablets on Mondays and Thursdays Recheck in 2 weeks

## 2022-08-20 ENCOUNTER — Ambulatory Visit: Payer: Medicare Other | Attending: Cardiology | Admitting: *Deleted

## 2022-08-20 DIAGNOSIS — Z5181 Encounter for therapeutic drug level monitoring: Secondary | ICD-10-CM

## 2022-08-20 DIAGNOSIS — I4891 Unspecified atrial fibrillation: Secondary | ICD-10-CM

## 2022-08-20 DIAGNOSIS — Z952 Presence of prosthetic heart valve: Secondary | ICD-10-CM

## 2022-08-20 LAB — POCT INR: INR: 1.7 — AB (ref 2.0–3.0)

## 2022-08-20 NOTE — Patient Instructions (Signed)
Increase warfarin to 3 tablets daily except 2 tablets on Sundays, Tuesdays and Thursdays Recheck in 2 weeks

## 2022-09-04 ENCOUNTER — Ambulatory Visit: Payer: Medicare Other | Attending: Cardiology | Admitting: *Deleted

## 2022-09-04 DIAGNOSIS — Z952 Presence of prosthetic heart valve: Secondary | ICD-10-CM | POA: Diagnosis not present

## 2022-09-04 DIAGNOSIS — Z5181 Encounter for therapeutic drug level monitoring: Secondary | ICD-10-CM

## 2022-09-04 LAB — POCT INR: INR: 2.6 (ref 2.0–3.0)

## 2022-09-04 NOTE — Patient Instructions (Addendum)
Continue warfarin 3 tablets daily except 2 tablets on Sundays, Tuesdays and Thursdays Recheck in 4 weeks

## 2022-09-26 ENCOUNTER — Ambulatory Visit (INDEPENDENT_AMBULATORY_CARE_PROVIDER_SITE_OTHER): Payer: Medicare Other

## 2022-09-26 ENCOUNTER — Telehealth: Payer: Self-pay

## 2022-09-26 DIAGNOSIS — E039 Hypothyroidism, unspecified: Secondary | ICD-10-CM | POA: Diagnosis not present

## 2022-09-26 DIAGNOSIS — I4891 Unspecified atrial fibrillation: Secondary | ICD-10-CM

## 2022-09-26 DIAGNOSIS — E7849 Other hyperlipidemia: Secondary | ICD-10-CM | POA: Diagnosis not present

## 2022-09-26 DIAGNOSIS — R5383 Other fatigue: Secondary | ICD-10-CM | POA: Diagnosis not present

## 2022-09-26 DIAGNOSIS — I10 Essential (primary) hypertension: Secondary | ICD-10-CM | POA: Diagnosis not present

## 2022-09-26 DIAGNOSIS — N183 Chronic kidney disease, stage 3 unspecified: Secondary | ICD-10-CM | POA: Diagnosis not present

## 2022-09-26 DIAGNOSIS — E559 Vitamin D deficiency, unspecified: Secondary | ICD-10-CM | POA: Diagnosis not present

## 2022-09-26 DIAGNOSIS — K219 Gastro-esophageal reflux disease without esophagitis: Secondary | ICD-10-CM | POA: Diagnosis not present

## 2022-09-26 LAB — CUP PACEART REMOTE DEVICE CHECK
Battery Impedance: 2512 Ohm
Battery Remaining Longevity: 30 mo
Battery Voltage: 2.74 V
Brady Statistic RV Percent Paced: 98 %
Date Time Interrogation Session: 20231122142341
Implantable Lead Connection Status: 753985
Implantable Lead Connection Status: 753985
Implantable Lead Implant Date: 20110701
Implantable Lead Implant Date: 20110701
Implantable Lead Location: 753859
Implantable Lead Location: 753860
Implantable Lead Model: 5076
Implantable Lead Model: 5076
Implantable Pulse Generator Implant Date: 20110701
Lead Channel Impedance Value: 518 Ohm
Lead Channel Impedance Value: 67 Ohm
Lead Channel Pacing Threshold Amplitude: 0.75 V
Lead Channel Pacing Threshold Pulse Width: 0.4 ms
Lead Channel Setting Pacing Amplitude: 2.5 V
Lead Channel Setting Pacing Pulse Width: 0.4 ms
Lead Channel Setting Sensing Sensitivity: 2.8 mV
Zone Setting Status: 755011
Zone Setting Status: 755011

## 2022-09-26 NOTE — Telephone Encounter (Signed)
Reviewed patients transmission and notified her:  Normal device function  Afib, patient is on warfarin.   Some brief periods of NSVT which patient has a pattern of.  Patient is asymptomatic.   Dr. Sallyanne Kuster is aware that this is her pattern.  No changes.   Patient has no concerns and thanks [place] for the follow up.

## 2022-09-26 NOTE — Telephone Encounter (Signed)
The patient would like for the nurse to review her transmission and give her a call back.

## 2022-10-03 ENCOUNTER — Ambulatory Visit: Payer: Medicare Other | Attending: Cardiology | Admitting: *Deleted

## 2022-10-03 DIAGNOSIS — M21611 Bunion of right foot: Secondary | ICD-10-CM | POA: Diagnosis not present

## 2022-10-03 DIAGNOSIS — E7849 Other hyperlipidemia: Secondary | ICD-10-CM | POA: Diagnosis not present

## 2022-10-03 DIAGNOSIS — R634 Abnormal weight loss: Secondary | ICD-10-CM | POA: Diagnosis not present

## 2022-10-03 DIAGNOSIS — R2681 Unsteadiness on feet: Secondary | ICD-10-CM | POA: Diagnosis not present

## 2022-10-03 DIAGNOSIS — I10 Essential (primary) hypertension: Secondary | ICD-10-CM | POA: Diagnosis not present

## 2022-10-03 DIAGNOSIS — D696 Thrombocytopenia, unspecified: Secondary | ICD-10-CM | POA: Diagnosis not present

## 2022-10-03 DIAGNOSIS — I4821 Permanent atrial fibrillation: Secondary | ICD-10-CM | POA: Diagnosis not present

## 2022-10-03 DIAGNOSIS — Z952 Presence of prosthetic heart valve: Secondary | ICD-10-CM

## 2022-10-03 DIAGNOSIS — E440 Moderate protein-calorie malnutrition: Secondary | ICD-10-CM | POA: Diagnosis not present

## 2022-10-03 DIAGNOSIS — Z23 Encounter for immunization: Secondary | ICD-10-CM | POA: Diagnosis not present

## 2022-10-03 DIAGNOSIS — Z5181 Encounter for therapeutic drug level monitoring: Secondary | ICD-10-CM

## 2022-10-03 DIAGNOSIS — Z950 Presence of cardiac pacemaker: Secondary | ICD-10-CM | POA: Diagnosis not present

## 2022-10-03 LAB — POCT INR: INR: 2.8 (ref 2.0–3.0)

## 2022-10-03 NOTE — Patient Instructions (Signed)
Decrease warfarin to 2 tablets daily except 3 tablets on Mondays, Wednesdays and Fridays Recheck in 4 weeks

## 2022-10-11 DIAGNOSIS — Z6822 Body mass index (BMI) 22.0-22.9, adult: Secondary | ICD-10-CM | POA: Diagnosis not present

## 2022-10-11 DIAGNOSIS — J069 Acute upper respiratory infection, unspecified: Secondary | ICD-10-CM | POA: Diagnosis not present

## 2022-10-15 ENCOUNTER — Other Ambulatory Visit: Payer: Self-pay | Admitting: Cardiovascular Disease

## 2022-10-15 DIAGNOSIS — I4891 Unspecified atrial fibrillation: Secondary | ICD-10-CM

## 2022-10-17 NOTE — Telephone Encounter (Signed)
Prescription refill request received for warfarin Lov: 06/29/22 ([place])  Next INR check: 10/31/22 Warfarin tablet strength: 2.'5mg'$   Appropriate dose and refill sent to requested pharmacy.

## 2022-10-17 NOTE — Progress Notes (Signed)
Remote pacemaker transmission.   

## 2022-10-31 ENCOUNTER — Ambulatory Visit: Payer: Medicare Other | Attending: Cardiology | Admitting: *Deleted

## 2022-10-31 DIAGNOSIS — Z952 Presence of prosthetic heart valve: Secondary | ICD-10-CM

## 2022-10-31 DIAGNOSIS — Z5181 Encounter for therapeutic drug level monitoring: Secondary | ICD-10-CM | POA: Diagnosis not present

## 2022-10-31 LAB — POCT INR: INR: 1.8 — AB (ref 2.0–3.0)

## 2022-10-31 NOTE — Patient Instructions (Signed)
Take warfarin 4 tablets today then resume 2 tablets daily except 3 tablets on Mondays, Wednesdays and Fridays Recheck in 3 weeks

## 2022-11-08 ENCOUNTER — Other Ambulatory Visit: Payer: Self-pay | Admitting: Cardiovascular Disease

## 2022-11-08 DIAGNOSIS — I4891 Unspecified atrial fibrillation: Secondary | ICD-10-CM

## 2022-11-08 NOTE — Telephone Encounter (Signed)
Refill request for warfarin:  Last INR was 1.8 on 10/31/22 Next INR due 11/22/22 LOV was 06/29/22  Jerilynn Mages [place] MD  Refill approved.

## 2022-11-21 ENCOUNTER — Telehealth: Payer: Self-pay | Admitting: Cardiovascular Disease

## 2022-11-21 NOTE — Telephone Encounter (Signed)
Please order bilateral carotid US.S. thanks

## 2022-11-21 NOTE — Telephone Encounter (Signed)
Reviewed notes from [place] office in [place], sent over to patients chart for review.   Dr.C- please review notes from office visit (under media tab).   Let me know if okay to order Carotid US.   Thanks!

## 2022-11-21 NOTE — Telephone Encounter (Signed)
Jessica from Dr. Durenda Age office called stating he would like for Dr Sallyanne Kuster to order a carotid ultrasound.  Patient has pallor of the ocular nerve head in both eyes.  They are going to fax over notes for him to view.

## 2022-11-22 ENCOUNTER — Ambulatory Visit: Payer: Medicare Other | Attending: Cardiology | Admitting: *Deleted

## 2022-11-22 DIAGNOSIS — Z952 Presence of prosthetic heart valve: Secondary | ICD-10-CM | POA: Diagnosis not present

## 2022-11-22 DIAGNOSIS — Z5181 Encounter for therapeutic drug level monitoring: Secondary | ICD-10-CM | POA: Diagnosis not present

## 2022-11-22 LAB — POCT INR: INR: 2.7 (ref 2.0–3.0)

## 2022-11-22 NOTE — Patient Instructions (Signed)
Continue warfarin 2 tablets daily except 3 tablets on Mondays, Wednesdays and Fridays Recheck in 3 weeks Saw eye dr yesterday.  He is suppose to be calling Dr C with findings.  Daughter is awaiting call back.

## 2022-11-23 ENCOUNTER — Other Ambulatory Visit: Payer: Self-pay

## 2022-11-23 DIAGNOSIS — H47293 Other optic atrophy, bilateral: Secondary | ICD-10-CM

## 2022-11-23 NOTE — Telephone Encounter (Signed)
Carotid US ordered.   Scheduling team, please reach out to get scheduled.   Thank you!

## 2022-11-28 ENCOUNTER — Ambulatory Visit (HOSPITAL_COMMUNITY): Payer: Medicare Other

## 2022-12-05 ENCOUNTER — Ambulatory Visit (HOSPITAL_COMMUNITY)
Admission: RE | Admit: 2022-12-05 | Discharge: 2022-12-05 | Disposition: A | Discharge status: Home / Self Care | Payer: Medicare Other | Source: Ambulatory Visit | Attending: Cardiovascular Disease | Admitting: Cardiovascular Disease

## 2022-12-05 DIAGNOSIS — H47293 Other optic atrophy, bilateral: Secondary | ICD-10-CM | POA: Insufficient documentation

## 2022-12-06 ENCOUNTER — Encounter: Payer: Self-pay | Admitting: *Deleted

## 2022-12-13 ENCOUNTER — Ambulatory Visit: Payer: Medicare Other | Attending: Cardiology | Admitting: *Deleted

## 2022-12-13 DIAGNOSIS — Z5181 Encounter for therapeutic drug level monitoring: Secondary | ICD-10-CM | POA: Diagnosis not present

## 2022-12-13 DIAGNOSIS — Z952 Presence of prosthetic heart valve: Secondary | ICD-10-CM | POA: Diagnosis not present

## 2022-12-13 LAB — POCT INR: INR: 2.9 (ref 2.0–3.0)

## 2022-12-13 NOTE — Patient Instructions (Signed)
Decrease warfarin to 2 tablets daily except 3 tablets on Mondays Recheck in 3 weeks

## 2022-12-26 ENCOUNTER — Ambulatory Visit (INDEPENDENT_AMBULATORY_CARE_PROVIDER_SITE_OTHER): Payer: Medicare Other

## 2022-12-26 DIAGNOSIS — I4891 Unspecified atrial fibrillation: Secondary | ICD-10-CM | POA: Diagnosis not present

## 2022-12-28 LAB — CUP PACEART REMOTE DEVICE CHECK
Battery Impedance: 2678 Ohm
Battery Remaining Longevity: 28 mo
Battery Voltage: 2.73 V
Brady Statistic RV Percent Paced: 96 %
Date Time Interrogation Session: 20240223121738
Implantable Lead Connection Status: 753985
Implantable Lead Connection Status: 753985
Implantable Lead Implant Date: 20110701
Implantable Lead Implant Date: 20110701
Implantable Lead Location: 753859
Implantable Lead Location: 753860
Implantable Lead Model: 5076
Implantable Lead Model: 5076
Implantable Pulse Generator Implant Date: 20110701
Lead Channel Impedance Value: 485 Ohm
Lead Channel Impedance Value: 67 Ohm
Lead Channel Pacing Threshold Amplitude: 0.75 V
Lead Channel Pacing Threshold Pulse Width: 0.4 ms
Lead Channel Setting Pacing Amplitude: 2.5 V
Lead Channel Setting Pacing Pulse Width: 0.4 ms
Lead Channel Setting Sensing Sensitivity: 2 mV
Zone Setting Status: 755011
Zone Setting Status: 755011

## 2023-01-02 ENCOUNTER — Ambulatory Visit: Payer: Medicare Other | Attending: Cardiology | Admitting: *Deleted

## 2023-01-02 DIAGNOSIS — I4821 Permanent atrial fibrillation: Secondary | ICD-10-CM | POA: Diagnosis not present

## 2023-01-02 DIAGNOSIS — E7849 Other hyperlipidemia: Secondary | ICD-10-CM | POA: Diagnosis not present

## 2023-01-02 DIAGNOSIS — Z0001 Encounter for general adult medical examination with abnormal findings: Secondary | ICD-10-CM | POA: Diagnosis not present

## 2023-01-02 DIAGNOSIS — Z950 Presence of cardiac pacemaker: Secondary | ICD-10-CM | POA: Diagnosis not present

## 2023-01-02 DIAGNOSIS — D696 Thrombocytopenia, unspecified: Secondary | ICD-10-CM | POA: Diagnosis not present

## 2023-01-02 DIAGNOSIS — Z952 Presence of prosthetic heart valve: Secondary | ICD-10-CM

## 2023-01-02 DIAGNOSIS — R2681 Unsteadiness on feet: Secondary | ICD-10-CM | POA: Diagnosis not present

## 2023-01-02 DIAGNOSIS — Z5181 Encounter for therapeutic drug level monitoring: Secondary | ICD-10-CM | POA: Diagnosis not present

## 2023-01-02 DIAGNOSIS — Z23 Encounter for immunization: Secondary | ICD-10-CM | POA: Diagnosis not present

## 2023-01-02 DIAGNOSIS — E440 Moderate protein-calorie malnutrition: Secondary | ICD-10-CM | POA: Diagnosis not present

## 2023-01-02 DIAGNOSIS — R413 Other amnesia: Secondary | ICD-10-CM | POA: Diagnosis not present

## 2023-01-02 DIAGNOSIS — I10 Essential (primary) hypertension: Secondary | ICD-10-CM | POA: Diagnosis not present

## 2023-01-02 LAB — POCT INR: INR: 2.8 (ref 2.0–3.0)

## 2023-01-02 NOTE — Patient Instructions (Signed)
Take 1 tablet tonight then continue 2 tablets daily except 3 tablets on Mondays Recheck in 4 weeks

## 2023-01-03 DIAGNOSIS — I10 Essential (primary) hypertension: Secondary | ICD-10-CM | POA: Diagnosis not present

## 2023-01-03 DIAGNOSIS — I4821 Permanent atrial fibrillation: Secondary | ICD-10-CM | POA: Diagnosis not present

## 2023-01-13 ENCOUNTER — Other Ambulatory Visit: Payer: Self-pay | Admitting: Cardiovascular Disease

## 2023-01-16 DIAGNOSIS — M810 Age-related osteoporosis without current pathological fracture: Secondary | ICD-10-CM | POA: Diagnosis not present

## 2023-01-23 NOTE — Progress Notes (Signed)
Remote pacemaker transmission.   

## 2023-01-30 ENCOUNTER — Ambulatory Visit: Payer: Medicare Other | Attending: Cardiology | Admitting: *Deleted

## 2023-01-30 DIAGNOSIS — Z5181 Encounter for therapeutic drug level monitoring: Secondary | ICD-10-CM | POA: Diagnosis not present

## 2023-01-30 DIAGNOSIS — Z952 Presence of prosthetic heart valve: Secondary | ICD-10-CM | POA: Diagnosis not present

## 2023-01-30 LAB — POCT INR: INR: 1.9 — AB (ref 2.0–3.0)

## 2023-01-30 NOTE — Patient Instructions (Signed)
Continue warfarin 2 tablets daily except 3 tablets on Mondays Recheck in 4 weeks 

## 2023-02-26 ENCOUNTER — Ambulatory Visit: Payer: Medicare Other | Attending: Cardiology | Admitting: *Deleted

## 2023-02-26 DIAGNOSIS — Z5181 Encounter for therapeutic drug level monitoring: Secondary | ICD-10-CM | POA: Diagnosis not present

## 2023-02-26 DIAGNOSIS — Z952 Presence of prosthetic heart valve: Secondary | ICD-10-CM | POA: Diagnosis not present

## 2023-02-26 LAB — POCT INR: INR: 2.2 (ref 2.0–3.0)

## 2023-02-26 NOTE — Patient Instructions (Signed)
Continue warfarin 2 tablets daily except 3 tablets on Mondays Recheck in 4 weeks 

## 2023-03-25 ENCOUNTER — Ambulatory Visit: Payer: Medicare Other | Attending: Cardiology | Admitting: *Deleted

## 2023-03-25 DIAGNOSIS — Z5181 Encounter for therapeutic drug level monitoring: Secondary | ICD-10-CM

## 2023-03-25 DIAGNOSIS — Z952 Presence of prosthetic heart valve: Secondary | ICD-10-CM | POA: Diagnosis not present

## 2023-03-25 LAB — POCT INR: INR: 2.1 (ref 2.0–3.0)

## 2023-03-25 NOTE — Patient Instructions (Signed)
Continue warfarin 2 tablets daily except 3 tablets on Mondays Recheck in 4 weeks 

## 2023-03-26 DIAGNOSIS — R5383 Other fatigue: Secondary | ICD-10-CM | POA: Diagnosis not present

## 2023-03-26 DIAGNOSIS — K219 Gastro-esophageal reflux disease without esophagitis: Secondary | ICD-10-CM | POA: Diagnosis not present

## 2023-03-26 DIAGNOSIS — E039 Hypothyroidism, unspecified: Secondary | ICD-10-CM | POA: Diagnosis not present

## 2023-03-26 DIAGNOSIS — I10 Essential (primary) hypertension: Secondary | ICD-10-CM | POA: Diagnosis not present

## 2023-03-26 DIAGNOSIS — N183 Chronic kidney disease, stage 3 unspecified: Secondary | ICD-10-CM | POA: Diagnosis not present

## 2023-03-26 DIAGNOSIS — E7849 Other hyperlipidemia: Secondary | ICD-10-CM | POA: Diagnosis not present

## 2023-03-26 DIAGNOSIS — E559 Vitamin D deficiency, unspecified: Secondary | ICD-10-CM | POA: Diagnosis not present

## 2023-04-04 DIAGNOSIS — D696 Thrombocytopenia, unspecified: Secondary | ICD-10-CM | POA: Diagnosis not present

## 2023-04-04 DIAGNOSIS — I10 Essential (primary) hypertension: Secondary | ICD-10-CM | POA: Diagnosis not present

## 2023-04-04 DIAGNOSIS — I4821 Permanent atrial fibrillation: Secondary | ICD-10-CM | POA: Diagnosis not present

## 2023-04-04 DIAGNOSIS — E7849 Other hyperlipidemia: Secondary | ICD-10-CM | POA: Diagnosis not present

## 2023-04-04 DIAGNOSIS — R2681 Unsteadiness on feet: Secondary | ICD-10-CM | POA: Diagnosis not present

## 2023-04-04 DIAGNOSIS — Z952 Presence of prosthetic heart valve: Secondary | ICD-10-CM | POA: Diagnosis not present

## 2023-04-04 DIAGNOSIS — Z950 Presence of cardiac pacemaker: Secondary | ICD-10-CM | POA: Diagnosis not present

## 2023-04-04 DIAGNOSIS — R413 Other amnesia: Secondary | ICD-10-CM | POA: Diagnosis not present

## 2023-04-04 DIAGNOSIS — E440 Moderate protein-calorie malnutrition: Secondary | ICD-10-CM | POA: Diagnosis not present

## 2023-04-22 ENCOUNTER — Ambulatory Visit: Payer: Medicare Other | Attending: Cardiology | Admitting: *Deleted

## 2023-04-22 DIAGNOSIS — Z5181 Encounter for therapeutic drug level monitoring: Secondary | ICD-10-CM

## 2023-04-22 DIAGNOSIS — Z952 Presence of prosthetic heart valve: Secondary | ICD-10-CM | POA: Diagnosis not present

## 2023-04-22 LAB — POCT INR: INR: 2.5 (ref 2.0–3.0)

## 2023-04-22 NOTE — Patient Instructions (Signed)
Continue warfarin 2 tablets daily except 3 tablets on Mondays Recheck in 6 weeks

## 2023-05-05 DIAGNOSIS — I10 Essential (primary) hypertension: Secondary | ICD-10-CM | POA: Diagnosis not present

## 2023-05-05 DIAGNOSIS — I4821 Permanent atrial fibrillation: Secondary | ICD-10-CM | POA: Diagnosis not present

## 2023-05-13 DIAGNOSIS — Z6824 Body mass index (BMI) 24.0-24.9, adult: Secondary | ICD-10-CM | POA: Diagnosis not present

## 2023-05-13 DIAGNOSIS — B079 Viral wart, unspecified: Secondary | ICD-10-CM | POA: Diagnosis not present

## 2023-06-04 ENCOUNTER — Ambulatory Visit: Payer: Medicare Other | Attending: Cardiology | Admitting: *Deleted

## 2023-06-04 DIAGNOSIS — Z5181 Encounter for therapeutic drug level monitoring: Secondary | ICD-10-CM | POA: Diagnosis not present

## 2023-06-04 DIAGNOSIS — Z952 Presence of prosthetic heart valve: Secondary | ICD-10-CM | POA: Diagnosis not present

## 2023-06-04 LAB — POCT INR: INR: 2.2 (ref 2.0–3.0)

## 2023-06-04 NOTE — Patient Instructions (Signed)
Continue warfarin 2 tablets daily except 3 tablets on Mondays Recheck in 6 weeks

## 2023-06-11 ENCOUNTER — Other Ambulatory Visit: Payer: Self-pay | Admitting: Cardiovascular Disease

## 2023-06-11 DIAGNOSIS — I4891 Unspecified atrial fibrillation: Secondary | ICD-10-CM

## 2023-06-11 NOTE — Telephone Encounter (Signed)
Refill request for warfarin:  Last INR was 2.2 on 06/04/23 Next INR due 07/16/23 LOV was 02/26/23  Refill approved.

## 2023-07-11 ENCOUNTER — Encounter: Payer: Self-pay | Admitting: Cardiovascular Disease

## 2023-07-11 ENCOUNTER — Ambulatory Visit: Payer: Medicare Other | Attending: Cardiovascular Disease | Admitting: Cardiovascular Disease

## 2023-07-11 VITALS — BP 110/58 | HR 75 | Wt 133.2 lb

## 2023-07-11 DIAGNOSIS — Z950 Presence of cardiac pacemaker: Secondary | ICD-10-CM | POA: Diagnosis not present

## 2023-07-11 DIAGNOSIS — D6869 Other thrombophilia: Secondary | ICD-10-CM | POA: Diagnosis not present

## 2023-07-11 DIAGNOSIS — I4729 Other ventricular tachycardia: Secondary | ICD-10-CM

## 2023-07-11 DIAGNOSIS — I10 Essential (primary) hypertension: Secondary | ICD-10-CM

## 2023-07-11 DIAGNOSIS — R296 Repeated falls: Secondary | ICD-10-CM | POA: Diagnosis not present

## 2023-07-11 DIAGNOSIS — Z952 Presence of prosthetic heart valve: Secondary | ICD-10-CM | POA: Diagnosis not present

## 2023-07-11 DIAGNOSIS — I4821 Permanent atrial fibrillation: Secondary | ICD-10-CM

## 2023-07-11 NOTE — Patient Instructions (Signed)
Medication Instructions:  No changes *If you need a refill on your cardiac medications before your next appointment, please call your pharmacy*  Testing/Procedures: Your physician has requested that you have an echocardiogram in August of 2025, before one year follow up with Dr Royann Shivers. Echocardiography is a painless test that uses sound waves to create images of your heart. It provides your doctor with information about the size and shape of your heart and how well your heart's chambers and valves are working. This procedure takes approximately one hour. There are no restrictions for this procedure. Please do NOT wear cologne, perfume, aftershave, or lotions (deodorant is allowed). Please arrive 15 minutes prior to your appointment time.    Follow-Up: At [facility], you and your health needs are our priority.  As part of our continuing mission to provide you with exceptional heart care, we have created designated Provider Care Teams.  These Care Teams include your primary Cardiologist (physician) and Advanced Practice Providers (APPs -  Physician Assistants and Nurse Practitioners) who all work together to provide you with the care you need, when you need it.  We recommend signing up for the patient portal called "MyChart".  Sign up information is provided on this After Visit Summary.  MyChart is used to connect with patients for Virtual Visits (Telemedicine).  Patients are able to view lab/test results, encounter notes, upcoming appointments, etc.  Non-urgent messages can be sent to your provider as well.   To learn more about what you can do with MyChart, go to [email or web address].    Your next appointment:   1 year(s)  Provider:   Thurmon Fair, MD

## 2023-07-11 NOTE — Progress Notes (Signed)
Patient ID: Alice Ballard, female   DOB: June 04, 1941, 82 y.o.   MRN: [id number]    Cardiology Office Note    Date:  07/11/2023   ID:  Alice Ballard, DOB [birth year], MRN [id number]  PCP:  Alice Alcide, MD  Cardiologist:   Alice Fair, MD   Chief Complaint  Patient presents with   Cardiac Valve Problem   Pacemaker Check    History of Present Illness:  Alice Ballard is a 82 y.o. 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   She is doing quite well from a cardiovascular point of view, but remains quite sedentary.  Thankfully she has not had any new falls since May 2023.  She is using her walker consistently.  She has managed to gain some weight and now has an optimal BMI just under 23.  She has not had problems with orthopnea, PND, chest pain, palpitations, syncope.  She has mild ankle edema especially towards the end of the day.  She does not have any active bleeding issues.  INR has largely been in therapeutic range, with only 1 subtherapeutic reading during the last year.  Pacemaker interrogation shows normal device function.  She has atrial fibrillation with slow ventricular response, in the 30-40 bpm range.  Even when the lower rate limit was set at 50 beats a minute she still had virtually 100% ventricular pacing.  She is now programmed with a lower rate limit of 70 bpm.  She has had a handful of brief episodes of nonsustained ventricular tachycardia, up to 6 seconds and maximum duration, not particularly fast (usually 160 bpm) and asymptomatic.  Lead parameters are excellent  The echocardiogram performed 06/26/2022 shows similar mildly decreased left ventricular ejection fraction of 40-45% (last year 45-50%) and stable gradients  across both the aortic and mitral valve prostheses (she has always had a mildly increased aortic valve mean gradient at about 15-20 mmHg, likely a degree of prosthesis mismatch).  Rather repetitive in her conversation and questions today.  Seems to be having some issues with cognitive dysfunction.  Alice Ballard is a 82 year old woman with a mechanical aortic and mitral valve prostheses as well as long-term persistent atrial fibrillation, hypertension, dyslipidemia. In 1986 her mitral valve was replaced with a 29 mm St. Jude prosthesis for rheumatic disease. In 1994 her aortic valve was replaced with a 19 mm St. Jude device. Has chronic moderate elevation in the valvular gradients across the aortic valve, likely an expression of patient-valve mismatch. Fluoroscopy has shown normal disc motion. Previous right heart catheterization had shown only very mild pulmonary hypertension (mean PA pressure 25 mm Hg, wedge pressure 16 mm Hg) She has no history of coronary artery disease. She has a dual-chamber permanent pacemaker (Medtronic) that was implanted in 2011 for tachycardia-bradycardia syndrome, now programmed VVIR, virtually 100% V paced. She is on chronic warfarin anticoagulation both for her mechanical valves and for atrial fibrillation. She has not had serious bleeding complications or embolic events. She does not have a history of stroke.  Past Medical History:  Diagnosis Date   Abnormality of gait 07/04/2016   Cerebral vascular disease    Coronary artery disease    Excessive daytime sleepiness 07/04/2016   Fatty liver    Gait abnormality    Gastric polyp    GERD (gastroesophageal  reflux disease)    Hemorrhoids    Hiatal hernia    Hyperlipidemia    Hypersomnolence    Hypertension    Pacemaker july 2011   medtronic adapta model # ADDRL 1,SERIAL [phone]   Paroxysmal atrial fibrillation [facility])    Seizure disorder [facility])    Sick sinus syndrome (HCC)    Spinal stenosis    Tubular adenoma of colon  11/2010   UTI (urinary tract infection)     Past Surgical History:  Procedure Laterality Date   AORTIC AND MITRAL VALVE REPLACEMENT  1994   19mm St. Jude   BACK SURGERY     CARDIAC CATHETERIZATION  04/2010   DOPPLER ECHOCARDIOGRAPHY  sept 06, 2013   aotric valve grad. peak 51 mmHg and mean 28 mm Hg  dimensional subtractive index 0.26; mitral valve grad peak 25 and mean 7mm Hg norm pressure halftime .   MITRAL VALVE REPLACEMENT  1986   St Jude prosthesis    NM MYOCAR PERF WALL MOTION  03/31/2008   EF 69% low risk   NM MYOCAR PERF WALL MOTION  04/19/2006   EF 69%   PACEMAKER PLACEMENT  05/2010   TEE WITHOUT CARDIOVERSION  07/11/2012   Procedure: TRANSESOPHAGEAL ECHOCARDIOGRAM (TEE);  Surgeon: Alice Fair, MD;  Location: [facility] ENDOSCOPY;  Service: Cardiovascular;  Laterality: N/A;    Outpatient Medications Prior to Visit  Medication Sig Dispense Refill   acetaminophen (TYLENOL) 500 MG tablet Take 1,000 mg by mouth every 6 (six) hours as needed. For pain     alendronate (FOSAMAX) 70 MG tablet Take 70 mg by mouth every 7 (seven) days. Take with a full glass of water on an empty stomach. Takes on Sunday.     Ascorbic Acid (VITAMIN C) 1000 MG tablet Take 1,000 mg by mouth daily.     atorvastatin (LIPITOR) 40 MG tablet TAKE 1 TABLET BY MOUTH DAILY (Please make APPOINTMENT FOR refills) 90 tablet 3   Cholecalciferol (VITAMIN D3) 2000 UNITS capsule Take 2,000 Units by mouth daily.     dorzolamide (TRUSOPT) 2 % ophthalmic solution Place 1 drop into the right eye 2 (two) times daily.     latanoprost (XALATAN) 0.005 % ophthalmic solution Place 1 drop into both eyes at bedtime.     levETIRAcetam (KEPPRA) 500 MG tablet Take 500 mg by mouth 2 (two) times daily.     lisinopril (ZESTRIL) 30 MG tablet TAKE 1 TABLET BY MOUTH EVERY DAY 90 tablet 1   vitamin B-12 (CYANOCOBALAMIN) 1000 MCG tablet Take 1,000 mcg by mouth daily.     warfarin (COUMADIN) 2.5 MG tablet TAKE 2 TO 3 TABLETS BY MOUTH DAILY AS  DIRECTED by coumadin clinic 210 tablet 1   No facility-administered medications prior to visit.     Allergies:   Shellfish allergy and Tegretol [carbamazepine]   Social History   Socioeconomic History   Marital status: Married    Spouse name: Not on file   Number of children: 1   Years of education: Not on file   Highest education level: Not on file  Occupational History   Occupation: DISABLED  Tobacco Use   Smoking status: Never   Smokeless tobacco: Never  Substance and Sexual Activity   Alcohol use: No   Drug use: No   Sexual activity: Not on file  Other Topics Concern   Not on file  Social History Narrative   Lives at home with her husband.   Right-handed.   16-20oz of caffeine per day.  Social Determinants of Health   Financial Resource Strain: Not on file  Food Insecurity: Not on file  Transportation Needs: Not on file  Physical Activity: Not on file  Stress: Not on file  Social Connections: Not on file     Family History:  The patient's family history includes Colon cancer in her sister; Colon polyps in her sister; Heart disease in her mother.   ROS:   Please see the history of present illness.    ROS All other systems are reviewed and are negative.   PHYSICAL EXAM:   VS:  BP (!) 110/58 (BP Location: Left Arm, Patient Position: Sitting, Cuff Size: Normal)   Pulse 75   Wt 133 lb 3.2 oz (60.4 kg)   SpO2 97%   BMI 22.86 kg/m       General: Alert, oriented x3, no distress, appears elderly and frail.  Healthy pacemaker site. Head: no evidence of trauma, PERRL, EOMI, no exophtalmos or lid lag, no myxedema, no xanthelasma; normal ears, nose and oropharynx Neck: normal jugular venous pulsations and no hepatojugular reflux; brisk carotid pulses without delay and no carotid bruits Chest: clear to auscultation, no signs of consolidation by percussion or palpation, normal fremitus, symmetrical and full respiratory excursions Cardiovascular: normal position and  quality of the apical impulse, regular rhythm, crisp prosthetic valve clicks, no systolic murmurs, rubs or gallops.  2/6 mid peaking aortic ejection murmur. Abdomen: no tenderness or distention, no masses by palpation, no abnormal pulsatility or arterial bruits, normal bowel sounds, no hepatosplenomegaly Extremities: no clubbing, cyanosis or edema; 2+ radial, ulnar and brachial pulses bilaterally; 2+ right femoral, posterior tibial and dorsalis pedis pulses; 2+ left femoral, posterior tibial and dorsalis pedis pulses; no subclavian or femoral bruits Neurological: grossly nonfocal Psych: Normal mood and affect    Wt Readings from Last 3 Encounters:  07/11/23 133 lb 3.2 oz (60.4 kg)  06/29/22 110 lb 9.6 oz (50.2 kg)  06/26/21 123 lb (55.8 kg)      Studies/Labs Reviewed:   Echocardiogram 06/26/2022    1. Left ventricular ejection fraction, by estimation, is 40 to 45%. The  left ventricle has mildly decreased function. The left ventricle  demonstrates global hypokinesis. Left ventricular diastolic function could  not be evaluated. Elevated left  ventricular end-diastolic pressure.   2. Right ventricular systolic function is mildly reduced. The right  ventricular size is normal. There is mildly elevated pulmonary artery  systolic pressure. The estimated right ventricular systolic pressure is  40.0 mmHg.   3. Left atrial size was moderately dilated.   4. Right atrial size was severely dilated.   5. The mitral valve has been repaired/replaced. There is a St. Jude  mechanical valve present in the mitral position.      Mitral regurgitation cannot be assessed due to LA shadowing from  mechanical MVR. No evidence of mitral stenosis. The mean mitral valve  gradient is 3.0 mmHg.   6. Tricuspid valve regurgitation is moderate.   7. The aortic valve has been repaired/replaced. Aortic valve  regurgitation is mild. No aortic stenosis is present. There is a St. Jude  mechanical valve present in  the aortic position. Aortic valve mean  gradient measures 19.5 mmHg. Aortic valve Vmax  measures 3.03 m/s.   8. Aortic dilatation noted. There is mild dilatation of the ascending  aorta, measuring 41 mm. There is mild dilatation of the aortic root,  measuring 39 mm.   9. The inferior vena cava is dilated in size with <50%  respiratory  variability, suggesting right atrial pressure of 15 mmHg.   Comparison(s): 06/29/20 EF 45-50%. MV mean PG, peak PG. AV  mean PG, peak PG.   EKG:  EKG is ordered today.  There is background atrial fibrillation and ventricular paced rhythm, distinct positive R wave in leads V1-V2, suggesting possible epicardial location of the electrode  Recent Labs: December 29, 2020 Cholesterol 136, HDL 57, LDL 62, triglycerides 87  Mar 29, 2021 Hemoglobin 12.0, creatinine 0.78, ALT 14, TSH 3.19  Mar 28, 2022 Cholesterol 151, HDL 73, LDL 62, triglycerides 86 Hemoglobin 12.5, creatinine 0.8, potassium 4.2, ALT 20 TSH 1.78 on 12/28/2021  03/26/2023 Cholesterol 151, HDL 69, LDL 62, triglycerides 74 Creatinine 1.03, potassium 4.5, TSH 0.9275  ASSESSMENT:    1. Permanent atrial fibrillation (HCC)   2. Pacemaker   3. NSVT (nonsustained ventricular tachycardia) (HCC)   4. Hx of mechanical aortic valve replacement   5. History of mitral valve replacement, mechanical   6. Acquired thrombophilia (HCC)   7. Essential hypertension   8. Falls frequently      PLAN:  In order of problems listed above:  Permanent atrial fibrillation: Slow ventricular response with the 100% ventricular paced rhythm. PPM: Technically not pacemaker dependent, but has 100% ventricular pacing with good heart rate histogram distribution with the current sensor settings. NSVT: Continues to have brief asymptomatic episodes roughly 3 times a month on the average, a pattern that has been consistent for years now. Mechanical AVR: Asymptomatic moderate degree of patient  prosthesis mismatch with a mean gradient that is now 20 mmHg or so.  Approach given severe aortic stenosis range (although gradients are likely overestimated by some degree of pressure recovery).  She is by no means a candidate for redo surgery.  She does not have symptoms of aortic stenosis.  She is aware of the need for endocarditis prophylaxis.  Since there has been no change in symptoms we will plan to recheck her echocardiogram in another 12 months. Mechanical MVR: Normal prosthetic valve function.  Check echo every 2 years. Warfarin: In therapeutic range.  No bleeding problems. HTN: Well-controlled on lisinopril monotherapy. Weight loss: Improved.  Not optimal weight. Falls: No falls in the last 12 months.  I congratulated her on using her walker consistently.  Needs to stay reasonably physically active, with a safety range, to keep strong leg muscles and reduce future risk of falls.   Medication Adjustments/Labs and Tests Ordered: Current medicines are reviewed at length with the patient today.  Concerns regarding medicines are outlined above.  Medication changes, Labs and Tests ordered today are listed in the Patient Instructions below. Patient Instructions  Medication Instructions:  No changes *If you need a refill on your cardiac medications before your next appointment, please call your pharmacy*  Testing/Procedures: Your physician has requested that you have an echocardiogram in August of 2025, before one year follow up with Dr Royann Shivers. Echocardiography is a painless test that uses sound waves to create images of your heart. It provides your doctor with information about the size and shape of your heart and how well your heart's chambers and valves are working. This procedure takes approximately one hour. There are no restrictions for this procedure. Please do NOT wear cologne, perfume, aftershave, or lotions (deodorant is allowed). Please arrive 15 minutes prior to your appointment  time.    Follow-Up: At [facility], you and your health needs are our priority.  As part of our continuing mission  to provide you with exceptional heart care, we have created designated Provider Care Teams.  These Care Teams include your primary Cardiologist (physician) and Advanced Practice Providers (APPs -  Physician Assistants and Nurse Practitioners) who all work together to provide you with the care you need, when you need it.  We recommend signing up for the patient portal called "MyChart".  Sign up information is provided on this After Visit Summary.  MyChart is used to connect with patients for Virtual Visits (Telemedicine).  Patients are able to view lab/test results, encounter notes, upcoming appointments, etc.  Non-urgent messages can be sent to your provider as well.   To learn more about what you can do with MyChart, go to [email or web address].    Your next appointment:   1 year(s)  Provider:   Thurmon Fair, MD         Signed, Alice Fair, MD  07/11/2023 8:44 AM    [facility] [address] [place], [place], [place]  [address] Phone: [phone]; Fax: [phone]

## 2023-07-13 ENCOUNTER — Other Ambulatory Visit: Payer: Self-pay | Admitting: Cardiovascular Disease

## 2023-07-15 ENCOUNTER — Encounter: Payer: Self-pay | Admitting: Cardiovascular Disease

## 2023-07-16 ENCOUNTER — Ambulatory Visit: Payer: Medicare Other | Attending: Cardiology | Admitting: *Deleted

## 2023-07-16 DIAGNOSIS — Z952 Presence of prosthetic heart valve: Secondary | ICD-10-CM

## 2023-07-16 DIAGNOSIS — Z5181 Encounter for therapeutic drug level monitoring: Secondary | ICD-10-CM | POA: Diagnosis not present

## 2023-07-16 LAB — POCT INR: INR: 2.5 (ref 2.0–3.0)

## 2023-07-16 NOTE — Patient Instructions (Signed)
Continue warfarin 2 tablets daily except 3 tablets on Mondays Recheck in 6 weeks

## 2023-08-20 ENCOUNTER — Ambulatory Visit (INDEPENDENT_AMBULATORY_CARE_PROVIDER_SITE_OTHER): Payer: Medicare Other

## 2023-08-20 DIAGNOSIS — I4821 Permanent atrial fibrillation: Secondary | ICD-10-CM | POA: Diagnosis not present

## 2023-08-21 LAB — CUP PACEART REMOTE DEVICE CHECK
Battery Impedance: 4628 Ohm
Battery Remaining Longevity: 13 mo
Battery Voltage: 2.66 V
Brady Statistic RV Percent Paced: 98 %
Date Time Interrogation Session: 20241015111050
Implantable Lead Connection Status: 753985
Implantable Lead Connection Status: 753985
Implantable Lead Implant Date: 20110701
Implantable Lead Implant Date: 20110701
Implantable Lead Location: 753859
Implantable Lead Location: 753860
Implantable Lead Model: 5076
Implantable Lead Model: 5076
Implantable Pulse Generator Implant Date: 20110701
Lead Channel Impedance Value: 524 Ohm
Lead Channel Impedance Value: 67 Ohm
Lead Channel Pacing Threshold Amplitude: 0.75 V
Lead Channel Pacing Threshold Pulse Width: 0.4 ms
Lead Channel Setting Pacing Amplitude: 2.5 V
Lead Channel Setting Pacing Pulse Width: 0.4 ms
Lead Channel Setting Sensing Sensitivity: 2.8 mV
Zone Setting Status: 755011
Zone Setting Status: 755011

## 2023-08-27 ENCOUNTER — Ambulatory Visit: Payer: Medicare Other | Attending: Cardiology | Admitting: *Deleted

## 2023-08-27 DIAGNOSIS — Z952 Presence of prosthetic heart valve: Secondary | ICD-10-CM

## 2023-08-27 DIAGNOSIS — Z5181 Encounter for therapeutic drug level monitoring: Secondary | ICD-10-CM | POA: Diagnosis not present

## 2023-08-27 LAB — POCT INR: INR: 2.9 (ref 2.0–3.0)

## 2023-08-27 NOTE — Patient Instructions (Signed)
DO NOT take warfarin tomorrow then resume 2 tablets daily except 3 tablets on Mondays Recheck in 4 weeks

## 2023-09-06 NOTE — Progress Notes (Signed)
Remote pacemaker transmission.   

## 2023-09-25 DIAGNOSIS — X32XXXA Exposure to sunlight, initial encounter: Secondary | ICD-10-CM | POA: Diagnosis not present

## 2023-09-25 DIAGNOSIS — L570 Actinic keratosis: Secondary | ICD-10-CM | POA: Diagnosis not present

## 2023-09-26 DIAGNOSIS — R7989 Other specified abnormal findings of blood chemistry: Secondary | ICD-10-CM | POA: Diagnosis not present

## 2023-09-26 DIAGNOSIS — E559 Vitamin D deficiency, unspecified: Secondary | ICD-10-CM | POA: Diagnosis not present

## 2023-09-26 DIAGNOSIS — E039 Hypothyroidism, unspecified: Secondary | ICD-10-CM | POA: Diagnosis not present

## 2023-09-26 DIAGNOSIS — E7849 Other hyperlipidemia: Secondary | ICD-10-CM | POA: Diagnosis not present

## 2023-09-26 DIAGNOSIS — Z0001 Encounter for general adult medical examination with abnormal findings: Secondary | ICD-10-CM | POA: Diagnosis not present

## 2023-10-01 ENCOUNTER — Ambulatory Visit: Payer: Medicare Other | Attending: Cardiology | Admitting: *Deleted

## 2023-10-01 DIAGNOSIS — R2681 Unsteadiness on feet: Secondary | ICD-10-CM | POA: Diagnosis not present

## 2023-10-01 DIAGNOSIS — I10 Essential (primary) hypertension: Secondary | ICD-10-CM | POA: Diagnosis not present

## 2023-10-01 DIAGNOSIS — Z952 Presence of prosthetic heart valve: Secondary | ICD-10-CM

## 2023-10-01 DIAGNOSIS — I4821 Permanent atrial fibrillation: Secondary | ICD-10-CM | POA: Diagnosis not present

## 2023-10-01 DIAGNOSIS — E440 Moderate protein-calorie malnutrition: Secondary | ICD-10-CM | POA: Diagnosis not present

## 2023-10-01 DIAGNOSIS — Z950 Presence of cardiac pacemaker: Secondary | ICD-10-CM | POA: Diagnosis not present

## 2023-10-01 DIAGNOSIS — I4891 Unspecified atrial fibrillation: Secondary | ICD-10-CM

## 2023-10-01 DIAGNOSIS — E7849 Other hyperlipidemia: Secondary | ICD-10-CM | POA: Diagnosis not present

## 2023-10-01 DIAGNOSIS — Z5181 Encounter for therapeutic drug level monitoring: Secondary | ICD-10-CM

## 2023-10-01 DIAGNOSIS — M2011 Hallux valgus (acquired), right foot: Secondary | ICD-10-CM | POA: Diagnosis not present

## 2023-10-01 DIAGNOSIS — Z23 Encounter for immunization: Secondary | ICD-10-CM | POA: Diagnosis not present

## 2023-10-01 DIAGNOSIS — N1831 Chronic kidney disease, stage 3a: Secondary | ICD-10-CM | POA: Diagnosis not present

## 2023-10-01 DIAGNOSIS — Z0001 Encounter for general adult medical examination with abnormal findings: Secondary | ICD-10-CM | POA: Diagnosis not present

## 2023-10-01 LAB — POCT INR: INR: 2.9 (ref 2.0–3.0)

## 2023-10-01 NOTE — Patient Instructions (Signed)
Decrease warfarin to 2 tablets daily Recheck in 5 weeks

## 2023-10-07 ENCOUNTER — Other Ambulatory Visit: Payer: Self-pay | Admitting: Cardiovascular Disease

## 2023-10-08 DIAGNOSIS — M2011 Hallux valgus (acquired), right foot: Secondary | ICD-10-CM | POA: Diagnosis not present

## 2023-10-08 DIAGNOSIS — R2681 Unsteadiness on feet: Secondary | ICD-10-CM | POA: Diagnosis not present

## 2023-10-08 DIAGNOSIS — I4821 Permanent atrial fibrillation: Secondary | ICD-10-CM | POA: Diagnosis not present

## 2023-10-08 DIAGNOSIS — D696 Thrombocytopenia, unspecified: Secondary | ICD-10-CM | POA: Diagnosis not present

## 2023-10-08 DIAGNOSIS — M549 Dorsalgia, unspecified: Secondary | ICD-10-CM | POA: Diagnosis not present

## 2023-10-08 DIAGNOSIS — G8929 Other chronic pain: Secondary | ICD-10-CM | POA: Diagnosis not present

## 2023-10-08 DIAGNOSIS — Z7901 Long term (current) use of anticoagulants: Secondary | ICD-10-CM | POA: Diagnosis not present

## 2023-10-08 DIAGNOSIS — Z952 Presence of prosthetic heart valve: Secondary | ICD-10-CM | POA: Diagnosis not present

## 2023-10-08 DIAGNOSIS — E440 Moderate protein-calorie malnutrition: Secondary | ICD-10-CM | POA: Diagnosis not present

## 2023-10-08 DIAGNOSIS — M19042 Primary osteoarthritis, left hand: Secondary | ICD-10-CM | POA: Diagnosis not present

## 2023-10-08 DIAGNOSIS — Z950 Presence of cardiac pacemaker: Secondary | ICD-10-CM | POA: Diagnosis not present

## 2023-10-08 DIAGNOSIS — N1831 Chronic kidney disease, stage 3a: Secondary | ICD-10-CM | POA: Diagnosis not present

## 2023-10-08 DIAGNOSIS — M21611 Bunion of right foot: Secondary | ICD-10-CM | POA: Diagnosis not present

## 2023-10-08 DIAGNOSIS — I129 Hypertensive chronic kidney disease with stage 1 through stage 4 chronic kidney disease, or unspecified chronic kidney disease: Secondary | ICD-10-CM | POA: Diagnosis not present

## 2023-10-08 DIAGNOSIS — E782 Mixed hyperlipidemia: Secondary | ICD-10-CM | POA: Diagnosis not present

## 2023-10-16 DIAGNOSIS — M2011 Hallux valgus (acquired), right foot: Secondary | ICD-10-CM | POA: Diagnosis not present

## 2023-10-16 DIAGNOSIS — Z7901 Long term (current) use of anticoagulants: Secondary | ICD-10-CM | POA: Diagnosis not present

## 2023-10-16 DIAGNOSIS — I129 Hypertensive chronic kidney disease with stage 1 through stage 4 chronic kidney disease, or unspecified chronic kidney disease: Secondary | ICD-10-CM | POA: Diagnosis not present

## 2023-10-16 DIAGNOSIS — M19042 Primary osteoarthritis, left hand: Secondary | ICD-10-CM | POA: Diagnosis not present

## 2023-10-16 DIAGNOSIS — Z952 Presence of prosthetic heart valve: Secondary | ICD-10-CM | POA: Diagnosis not present

## 2023-10-16 DIAGNOSIS — Z950 Presence of cardiac pacemaker: Secondary | ICD-10-CM | POA: Diagnosis not present

## 2023-10-16 DIAGNOSIS — G8929 Other chronic pain: Secondary | ICD-10-CM | POA: Diagnosis not present

## 2023-10-16 DIAGNOSIS — M21611 Bunion of right foot: Secondary | ICD-10-CM | POA: Diagnosis not present

## 2023-10-16 DIAGNOSIS — R2681 Unsteadiness on feet: Secondary | ICD-10-CM | POA: Diagnosis not present

## 2023-10-16 DIAGNOSIS — E782 Mixed hyperlipidemia: Secondary | ICD-10-CM | POA: Diagnosis not present

## 2023-10-16 DIAGNOSIS — N1831 Chronic kidney disease, stage 3a: Secondary | ICD-10-CM | POA: Diagnosis not present

## 2023-10-16 DIAGNOSIS — D696 Thrombocytopenia, unspecified: Secondary | ICD-10-CM | POA: Diagnosis not present

## 2023-10-16 DIAGNOSIS — E440 Moderate protein-calorie malnutrition: Secondary | ICD-10-CM | POA: Diagnosis not present

## 2023-10-16 DIAGNOSIS — I4821 Permanent atrial fibrillation: Secondary | ICD-10-CM | POA: Diagnosis not present

## 2023-10-16 DIAGNOSIS — M549 Dorsalgia, unspecified: Secondary | ICD-10-CM | POA: Diagnosis not present

## 2023-10-23 DIAGNOSIS — G8929 Other chronic pain: Secondary | ICD-10-CM | POA: Diagnosis not present

## 2023-10-23 DIAGNOSIS — I129 Hypertensive chronic kidney disease with stage 1 through stage 4 chronic kidney disease, or unspecified chronic kidney disease: Secondary | ICD-10-CM | POA: Diagnosis not present

## 2023-10-23 DIAGNOSIS — E782 Mixed hyperlipidemia: Secondary | ICD-10-CM | POA: Diagnosis not present

## 2023-10-23 DIAGNOSIS — M2011 Hallux valgus (acquired), right foot: Secondary | ICD-10-CM | POA: Diagnosis not present

## 2023-10-23 DIAGNOSIS — Z952 Presence of prosthetic heart valve: Secondary | ICD-10-CM | POA: Diagnosis not present

## 2023-10-23 DIAGNOSIS — R2681 Unsteadiness on feet: Secondary | ICD-10-CM | POA: Diagnosis not present

## 2023-10-23 DIAGNOSIS — Z7901 Long term (current) use of anticoagulants: Secondary | ICD-10-CM | POA: Diagnosis not present

## 2023-10-23 DIAGNOSIS — M21611 Bunion of right foot: Secondary | ICD-10-CM | POA: Diagnosis not present

## 2023-10-23 DIAGNOSIS — Z950 Presence of cardiac pacemaker: Secondary | ICD-10-CM | POA: Diagnosis not present

## 2023-10-23 DIAGNOSIS — N1831 Chronic kidney disease, stage 3a: Secondary | ICD-10-CM | POA: Diagnosis not present

## 2023-10-23 DIAGNOSIS — I4821 Permanent atrial fibrillation: Secondary | ICD-10-CM | POA: Diagnosis not present

## 2023-10-23 DIAGNOSIS — M549 Dorsalgia, unspecified: Secondary | ICD-10-CM | POA: Diagnosis not present

## 2023-10-23 DIAGNOSIS — E440 Moderate protein-calorie malnutrition: Secondary | ICD-10-CM | POA: Diagnosis not present

## 2023-10-23 DIAGNOSIS — D696 Thrombocytopenia, unspecified: Secondary | ICD-10-CM | POA: Diagnosis not present

## 2023-10-23 DIAGNOSIS — M19042 Primary osteoarthritis, left hand: Secondary | ICD-10-CM | POA: Diagnosis not present

## 2023-11-04 DIAGNOSIS — D696 Thrombocytopenia, unspecified: Secondary | ICD-10-CM | POA: Diagnosis not present

## 2023-11-04 DIAGNOSIS — Z950 Presence of cardiac pacemaker: Secondary | ICD-10-CM | POA: Diagnosis not present

## 2023-11-04 DIAGNOSIS — Z7901 Long term (current) use of anticoagulants: Secondary | ICD-10-CM | POA: Diagnosis not present

## 2023-11-04 DIAGNOSIS — M549 Dorsalgia, unspecified: Secondary | ICD-10-CM | POA: Diagnosis not present

## 2023-11-04 DIAGNOSIS — E440 Moderate protein-calorie malnutrition: Secondary | ICD-10-CM | POA: Diagnosis not present

## 2023-11-04 DIAGNOSIS — Z952 Presence of prosthetic heart valve: Secondary | ICD-10-CM | POA: Diagnosis not present

## 2023-11-04 DIAGNOSIS — M21611 Bunion of right foot: Secondary | ICD-10-CM | POA: Diagnosis not present

## 2023-11-04 DIAGNOSIS — I4821 Permanent atrial fibrillation: Secondary | ICD-10-CM | POA: Diagnosis not present

## 2023-11-04 DIAGNOSIS — M2011 Hallux valgus (acquired), right foot: Secondary | ICD-10-CM | POA: Diagnosis not present

## 2023-11-04 DIAGNOSIS — R2681 Unsteadiness on feet: Secondary | ICD-10-CM | POA: Diagnosis not present

## 2023-11-04 DIAGNOSIS — N1831 Chronic kidney disease, stage 3a: Secondary | ICD-10-CM | POA: Diagnosis not present

## 2023-11-04 DIAGNOSIS — I129 Hypertensive chronic kidney disease with stage 1 through stage 4 chronic kidney disease, or unspecified chronic kidney disease: Secondary | ICD-10-CM | POA: Diagnosis not present

## 2023-11-04 DIAGNOSIS — M19042 Primary osteoarthritis, left hand: Secondary | ICD-10-CM | POA: Diagnosis not present

## 2023-11-04 DIAGNOSIS — G8929 Other chronic pain: Secondary | ICD-10-CM | POA: Diagnosis not present

## 2023-11-04 DIAGNOSIS — E782 Mixed hyperlipidemia: Secondary | ICD-10-CM | POA: Diagnosis not present

## 2023-11-05 DIAGNOSIS — I4821 Permanent atrial fibrillation: Secondary | ICD-10-CM | POA: Diagnosis not present

## 2023-11-05 DIAGNOSIS — E440 Moderate protein-calorie malnutrition: Secondary | ICD-10-CM | POA: Diagnosis not present

## 2023-11-05 DIAGNOSIS — I10 Essential (primary) hypertension: Secondary | ICD-10-CM | POA: Diagnosis not present

## 2023-11-05 DIAGNOSIS — R296 Repeated falls: Secondary | ICD-10-CM | POA: Diagnosis not present

## 2023-11-08 ENCOUNTER — Ambulatory Visit: Payer: Medicare Other | Attending: Cardiovascular Disease | Admitting: *Deleted

## 2023-11-08 DIAGNOSIS — I4891 Unspecified atrial fibrillation: Secondary | ICD-10-CM

## 2023-11-08 DIAGNOSIS — Z952 Presence of prosthetic heart valve: Secondary | ICD-10-CM | POA: Diagnosis not present

## 2023-11-08 DIAGNOSIS — Z5181 Encounter for therapeutic drug level monitoring: Secondary | ICD-10-CM

## 2023-11-08 LAB — POCT INR: INR: 2.5 (ref 2.0–3.0)

## 2023-11-08 NOTE — Patient Instructions (Signed)
Continue warfarin 2 tablets daily Recheck in 6 weeks.  

## 2023-11-12 DIAGNOSIS — Z1231 Encounter for screening mammogram for malignant neoplasm of breast: Secondary | ICD-10-CM | POA: Diagnosis not present

## 2023-11-13 DIAGNOSIS — D696 Thrombocytopenia, unspecified: Secondary | ICD-10-CM | POA: Diagnosis not present

## 2023-11-13 DIAGNOSIS — M549 Dorsalgia, unspecified: Secondary | ICD-10-CM | POA: Diagnosis not present

## 2023-11-13 DIAGNOSIS — M21611 Bunion of right foot: Secondary | ICD-10-CM | POA: Diagnosis not present

## 2023-11-13 DIAGNOSIS — M2011 Hallux valgus (acquired), right foot: Secondary | ICD-10-CM | POA: Diagnosis not present

## 2023-11-13 DIAGNOSIS — M19042 Primary osteoarthritis, left hand: Secondary | ICD-10-CM | POA: Diagnosis not present

## 2023-11-13 DIAGNOSIS — E782 Mixed hyperlipidemia: Secondary | ICD-10-CM | POA: Diagnosis not present

## 2023-11-13 DIAGNOSIS — N1831 Chronic kidney disease, stage 3a: Secondary | ICD-10-CM | POA: Diagnosis not present

## 2023-11-13 DIAGNOSIS — Z950 Presence of cardiac pacemaker: Secondary | ICD-10-CM | POA: Diagnosis not present

## 2023-11-13 DIAGNOSIS — Z952 Presence of prosthetic heart valve: Secondary | ICD-10-CM | POA: Diagnosis not present

## 2023-11-13 DIAGNOSIS — Z7901 Long term (current) use of anticoagulants: Secondary | ICD-10-CM | POA: Diagnosis not present

## 2023-11-13 DIAGNOSIS — I129 Hypertensive chronic kidney disease with stage 1 through stage 4 chronic kidney disease, or unspecified chronic kidney disease: Secondary | ICD-10-CM | POA: Diagnosis not present

## 2023-11-13 DIAGNOSIS — E440 Moderate protein-calorie malnutrition: Secondary | ICD-10-CM | POA: Diagnosis not present

## 2023-11-13 DIAGNOSIS — R2681 Unsteadiness on feet: Secondary | ICD-10-CM | POA: Diagnosis not present

## 2023-11-13 DIAGNOSIS — I4821 Permanent atrial fibrillation: Secondary | ICD-10-CM | POA: Diagnosis not present

## 2023-11-13 DIAGNOSIS — G8929 Other chronic pain: Secondary | ICD-10-CM | POA: Diagnosis not present

## 2023-11-21 ENCOUNTER — Ambulatory Visit (INDEPENDENT_AMBULATORY_CARE_PROVIDER_SITE_OTHER): Payer: Medicare Other

## 2023-11-21 DIAGNOSIS — I4891 Unspecified atrial fibrillation: Secondary | ICD-10-CM

## 2023-11-22 ENCOUNTER — Telehealth: Payer: Self-pay

## 2023-11-22 DIAGNOSIS — M549 Dorsalgia, unspecified: Secondary | ICD-10-CM | POA: Diagnosis not present

## 2023-11-22 DIAGNOSIS — Z952 Presence of prosthetic heart valve: Secondary | ICD-10-CM | POA: Diagnosis not present

## 2023-11-22 DIAGNOSIS — I4821 Permanent atrial fibrillation: Secondary | ICD-10-CM | POA: Diagnosis not present

## 2023-11-22 DIAGNOSIS — M19042 Primary osteoarthritis, left hand: Secondary | ICD-10-CM | POA: Diagnosis not present

## 2023-11-22 DIAGNOSIS — M21611 Bunion of right foot: Secondary | ICD-10-CM | POA: Diagnosis not present

## 2023-11-22 DIAGNOSIS — Z7901 Long term (current) use of anticoagulants: Secondary | ICD-10-CM | POA: Diagnosis not present

## 2023-11-22 DIAGNOSIS — M2011 Hallux valgus (acquired), right foot: Secondary | ICD-10-CM | POA: Diagnosis not present

## 2023-11-22 DIAGNOSIS — Z950 Presence of cardiac pacemaker: Secondary | ICD-10-CM | POA: Diagnosis not present

## 2023-11-22 DIAGNOSIS — E782 Mixed hyperlipidemia: Secondary | ICD-10-CM | POA: Diagnosis not present

## 2023-11-22 DIAGNOSIS — G8929 Other chronic pain: Secondary | ICD-10-CM | POA: Diagnosis not present

## 2023-11-22 DIAGNOSIS — E440 Moderate protein-calorie malnutrition: Secondary | ICD-10-CM | POA: Diagnosis not present

## 2023-11-22 DIAGNOSIS — R2681 Unsteadiness on feet: Secondary | ICD-10-CM | POA: Diagnosis not present

## 2023-11-22 DIAGNOSIS — N1831 Chronic kidney disease, stage 3a: Secondary | ICD-10-CM | POA: Diagnosis not present

## 2023-11-22 DIAGNOSIS — D696 Thrombocytopenia, unspecified: Secondary | ICD-10-CM | POA: Diagnosis not present

## 2023-11-22 DIAGNOSIS — I129 Hypertensive chronic kidney disease with stage 1 through stage 4 chronic kidney disease, or unspecified chronic kidney disease: Secondary | ICD-10-CM | POA: Diagnosis not present

## 2023-11-22 LAB — CUP PACEART REMOTE DEVICE CHECK
Battery Impedance: 7340 Ohm
Battery Remaining Longevity: 1 mo — CL
Battery Voltage: 2.6 V
Brady Statistic RV Percent Paced: 98 %
Date Time Interrogation Session: 20250116131345
Implantable Lead Connection Status: 753985
Implantable Lead Connection Status: 753985
Implantable Lead Implant Date: 20110701
Implantable Lead Implant Date: 20110701
Implantable Lead Location: 753859
Implantable Lead Location: 753860
Implantable Lead Model: 5076
Implantable Lead Model: 5076
Implantable Pulse Generator Implant Date: 20110701
Lead Channel Impedance Value: 554 Ohm
Lead Channel Impedance Value: 67 Ohm
Lead Channel Pacing Threshold Amplitude: 0.75 V
Lead Channel Pacing Threshold Pulse Width: 0.4 ms
Lead Channel Setting Pacing Amplitude: 2.5 V
Lead Channel Setting Pacing Pulse Width: 0.4 ms
Lead Channel Setting Sensing Sensitivity: 2.8 mV
Zone Setting Status: 755011
Zone Setting Status: 755011

## 2023-11-22 NOTE — Telephone Encounter (Signed)
Estimated battery longevity < 1 month, per MDT [facility] RRT will trigger when Battery voltage is <2.59 V and Impedance is > 3000 ohms; routed to clinic for initiation of IFU.   14 VHR episodes recorded, longest 19 sec, Marker channels/EGMs suggestive of VT at 160-202 bpm; Routed to clinic for review.    These are known episodes, this is scheduled transmission.  Will make monthly battery checks and forward to scheduling team to get in with Dr. Salena Saner for gen change discussion appt.

## 2023-11-27 DIAGNOSIS — M19042 Primary osteoarthritis, left hand: Secondary | ICD-10-CM | POA: Diagnosis not present

## 2023-11-27 DIAGNOSIS — Z952 Presence of prosthetic heart valve: Secondary | ICD-10-CM | POA: Diagnosis not present

## 2023-11-27 DIAGNOSIS — R2681 Unsteadiness on feet: Secondary | ICD-10-CM | POA: Diagnosis not present

## 2023-11-27 DIAGNOSIS — M21611 Bunion of right foot: Secondary | ICD-10-CM | POA: Diagnosis not present

## 2023-11-27 DIAGNOSIS — N1831 Chronic kidney disease, stage 3a: Secondary | ICD-10-CM | POA: Diagnosis not present

## 2023-11-27 DIAGNOSIS — Z950 Presence of cardiac pacemaker: Secondary | ICD-10-CM | POA: Diagnosis not present

## 2023-11-27 DIAGNOSIS — M2011 Hallux valgus (acquired), right foot: Secondary | ICD-10-CM | POA: Diagnosis not present

## 2023-11-27 DIAGNOSIS — D696 Thrombocytopenia, unspecified: Secondary | ICD-10-CM | POA: Diagnosis not present

## 2023-11-27 DIAGNOSIS — Z7901 Long term (current) use of anticoagulants: Secondary | ICD-10-CM | POA: Diagnosis not present

## 2023-11-27 DIAGNOSIS — I129 Hypertensive chronic kidney disease with stage 1 through stage 4 chronic kidney disease, or unspecified chronic kidney disease: Secondary | ICD-10-CM | POA: Diagnosis not present

## 2023-11-27 DIAGNOSIS — M549 Dorsalgia, unspecified: Secondary | ICD-10-CM | POA: Diagnosis not present

## 2023-11-27 DIAGNOSIS — I4821 Permanent atrial fibrillation: Secondary | ICD-10-CM | POA: Diagnosis not present

## 2023-11-27 DIAGNOSIS — G8929 Other chronic pain: Secondary | ICD-10-CM | POA: Diagnosis not present

## 2023-11-27 DIAGNOSIS — E440 Moderate protein-calorie malnutrition: Secondary | ICD-10-CM | POA: Diagnosis not present

## 2023-11-27 DIAGNOSIS — E782 Mixed hyperlipidemia: Secondary | ICD-10-CM | POA: Diagnosis not present

## 2023-11-29 ENCOUNTER — Telehealth: Payer: Self-pay | Admitting: Emergency Medicine

## 2023-11-29 NOTE — Progress Notes (Signed)
That's fine on 17th

## 2023-11-29 NOTE — Telephone Encounter (Signed)
Spoke with Alice Ballard (dpr) discussed Gen Change Scheduled for 12/26/23 for her mother- had it scheduled for 1:30, but she said that she cannot make it that early. Asked her if 3:30 would work better- pt be there at 2:30, she reports that would be much better.  Informed her that we will go over all of the instructions and information about the Gen change at the upcoming appointment with Dr Royann Shivers on 12/23/23. She verbalized understanding.   (Called and had Gen change switched to 12/26/23 at 3:30)

## 2023-12-06 DIAGNOSIS — M549 Dorsalgia, unspecified: Secondary | ICD-10-CM | POA: Diagnosis not present

## 2023-12-06 DIAGNOSIS — Z952 Presence of prosthetic heart valve: Secondary | ICD-10-CM | POA: Diagnosis not present

## 2023-12-06 DIAGNOSIS — Z7901 Long term (current) use of anticoagulants: Secondary | ICD-10-CM | POA: Diagnosis not present

## 2023-12-06 DIAGNOSIS — R2681 Unsteadiness on feet: Secondary | ICD-10-CM | POA: Diagnosis not present

## 2023-12-06 DIAGNOSIS — G8929 Other chronic pain: Secondary | ICD-10-CM | POA: Diagnosis not present

## 2023-12-06 DIAGNOSIS — N1831 Chronic kidney disease, stage 3a: Secondary | ICD-10-CM | POA: Diagnosis not present

## 2023-12-06 DIAGNOSIS — Z950 Presence of cardiac pacemaker: Secondary | ICD-10-CM | POA: Diagnosis not present

## 2023-12-06 DIAGNOSIS — I4821 Permanent atrial fibrillation: Secondary | ICD-10-CM | POA: Diagnosis not present

## 2023-12-06 DIAGNOSIS — M19042 Primary osteoarthritis, left hand: Secondary | ICD-10-CM | POA: Diagnosis not present

## 2023-12-06 DIAGNOSIS — M21611 Bunion of right foot: Secondary | ICD-10-CM | POA: Diagnosis not present

## 2023-12-06 DIAGNOSIS — D696 Thrombocytopenia, unspecified: Secondary | ICD-10-CM | POA: Diagnosis not present

## 2023-12-06 DIAGNOSIS — M2011 Hallux valgus (acquired), right foot: Secondary | ICD-10-CM | POA: Diagnosis not present

## 2023-12-06 DIAGNOSIS — E782 Mixed hyperlipidemia: Secondary | ICD-10-CM | POA: Diagnosis not present

## 2023-12-06 DIAGNOSIS — I129 Hypertensive chronic kidney disease with stage 1 through stage 4 chronic kidney disease, or unspecified chronic kidney disease: Secondary | ICD-10-CM | POA: Diagnosis not present

## 2023-12-06 DIAGNOSIS — E440 Moderate protein-calorie malnutrition: Secondary | ICD-10-CM | POA: Diagnosis not present

## 2023-12-16 DIAGNOSIS — Z950 Presence of cardiac pacemaker: Secondary | ICD-10-CM | POA: Diagnosis not present

## 2023-12-16 DIAGNOSIS — Z6823 Body mass index (BMI) 23.0-23.9, adult: Secondary | ICD-10-CM | POA: Diagnosis not present

## 2023-12-17 ENCOUNTER — Telehealth: Payer: Self-pay | Admitting: Cardiovascular Disease

## 2023-12-17 NOTE — Telephone Encounter (Signed)
Called to relay Dr. Renaye Rakers message. Pt's daughter states they will be there.

## 2023-12-17 NOTE — Telephone Encounter (Signed)
PCP told Daughter to call and let us know she was diagnosis with the flu on 2/10 but has been sick since Sat. They just wanted you to know because she has a procedure coming up

## 2023-12-17 NOTE — Telephone Encounter (Signed)
Called pt's daughter to let her know  we will get the message to Dr. Salena Saner to deter if pt can still come Monday. She states "I can't come in the mornings for any appointment. It would have to be after 2. I hope he can still see her Monday. " ; "Her primacy care said she should be cleared by that time."

## 2023-12-20 ENCOUNTER — Telehealth: Payer: Self-pay

## 2023-12-20 NOTE — Telephone Encounter (Signed)
Medication Reconciliation prior to in person appointment

## 2023-12-23 ENCOUNTER — Encounter: Payer: Self-pay | Admitting: Cardiovascular Disease

## 2023-12-23 ENCOUNTER — Ambulatory Visit (INDEPENDENT_AMBULATORY_CARE_PROVIDER_SITE_OTHER): Payer: Medicare Other | Admitting: Cardiovascular Disease

## 2023-12-23 ENCOUNTER — Ambulatory Visit: Payer: Medicare Other | Attending: Cardiovascular Disease | Admitting: *Deleted

## 2023-12-23 VITALS — BP 122/60 | HR 65 | Wt 120.6 lb

## 2023-12-23 DIAGNOSIS — I4821 Permanent atrial fibrillation: Secondary | ICD-10-CM

## 2023-12-23 DIAGNOSIS — R296 Repeated falls: Secondary | ICD-10-CM

## 2023-12-23 DIAGNOSIS — I10 Essential (primary) hypertension: Secondary | ICD-10-CM

## 2023-12-23 DIAGNOSIS — Z5181 Encounter for therapeutic drug level monitoring: Secondary | ICD-10-CM | POA: Diagnosis not present

## 2023-12-23 DIAGNOSIS — D6869 Other thrombophilia: Secondary | ICD-10-CM

## 2023-12-23 DIAGNOSIS — I472 Ventricular tachycardia, unspecified: Secondary | ICD-10-CM | POA: Diagnosis not present

## 2023-12-23 DIAGNOSIS — Z952 Presence of prosthetic heart valve: Secondary | ICD-10-CM | POA: Diagnosis not present

## 2023-12-23 DIAGNOSIS — R636 Underweight: Secondary | ICD-10-CM | POA: Diagnosis not present

## 2023-12-23 DIAGNOSIS — I4891 Unspecified atrial fibrillation: Secondary | ICD-10-CM

## 2023-12-23 DIAGNOSIS — Z45010 Encounter for checking and testing of cardiac pacemaker pulse generator [battery]: Secondary | ICD-10-CM | POA: Diagnosis not present

## 2023-12-23 LAB — POCT INR: INR: 4.2 — AB (ref 2.0–3.0)

## 2023-12-23 NOTE — H&P (View-Only) (Signed)
 Patient ID: Alice Ballard, female   DOB: 1941/07/23, 83 y.o.   MRN: [id number]    Cardiology Office Note    Date:  12/23/2023   ID:  Alice Ballard, DOB Nov 14, 1940, MRN [id number]  PCP:  Juliette Alcide, MD  Cardiologist:   Thurmon Fair, MD   Chief Complaint  Patient presents with   Pacemaker Problem    History of Present Illness:  Alice Ballard is a 83 y.o. 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   The patient is a repeat elective replacement indicator on 11/26/2023 and she is here to discuss generator change out.  She is not pacemaker dependent but has virtually 100% ventricular pacing due to slow ventricular response.  She has been doing otherwise roughly the same until last week when she was diagnosed with the flu.  She has just had general lassitude and poor appetite, but has not had shortness of breath or severe cough..  She is quite sedentary.  She has not had any recent falls and she is using her walker consistently.  She has lost weight again and her BMI is down to about 21.  She has not had palpitations, dizziness or syncope and denies shortness of breath or chest pain at rest or with mild activity that she performs.  She occasionally has ankle swelling at the end of the day, but she does not have orthopnea or PND.  She has not had any serious bleeding problems.  Earlier today her INR was 4.2, but this is unusual for her.  Her INR has been between 2.0 and 3.0 over the last 11 months.  She is doing quite well from a cardiovascular point of view, but remains quite sedentary.  Thankfully she has not had any new falls since May 2023.  She is using her walker consistently.  She has managed to gain some weight and now has an  optimal BMI just under 23.  She has not had problems with orthopnea, PND, chest pain, palpitations, syncope.  She has mild ankle edema especially towards the end of the day.  She does not have any active bleeding issues.  INR has largely been in therapeutic range, with only 1 subtherapeutic reading during the last year.  Pacemaker interrogation shows normal device function.  She has atrial fibrillation with slow ventricular response, in the 30-40 bpm range.  Even when the lower rate limit was set at 50 beats a minute she still had virtually 100% ventricular pacing.  She is now programmed with a lower rate limit of 70 bpm.  She has had a handful of brief episodes of nonsustained ventricular tachycardia, up to 6 seconds and maximum duration, not particularly fast (usually 160 bpm) and asymptomatic.  Lead parameters are excellent  The echocardiogram performed 06/26/2022 shows similar mildly decreased left ventricular ejection fraction of 40-45% (last year 45-50%) and stable gradients across both the aortic and mitral valve prostheses (she has always had a mildly increased aortic valve mean gradient at about 15-20 mmHg, likely a degree of prosthesis mismatch).  Once again it is noted today that she seems to be having cognitive dysfunction as she has difficulty participating in the conversation and asks repetitive questions.  Maybe there is some issues with hearing as well.  Alice Ballard is a 83 year old woman with a  mechanical aortic and mitral valve prostheses as well as long-term persistent atrial fibrillation, hypertension, dyslipidemia. In 1986 her mitral valve was replaced with a 29 mm St. Jude prosthesis for rheumatic disease. In 1994 her aortic valve was replaced with a 19 mm St. Jude device. Has chronic moderate elevation in the valvular gradients across the aortic valve, likely an expression of patient-valve mismatch. Fluoroscopy has shown normal disc motion. Previous right heart catheterization had shown  only very mild pulmonary hypertension (mean PA pressure 25 mm Hg, wedge pressure 16 mm Hg) She has no history of coronary artery disease. She has a dual-chamber permanent pacemaker (Medtronic) that was implanted in 2011 for tachycardia-bradycardia syndrome, now programmed VVIR, virtually 100% V paced. She is on chronic warfarin anticoagulation both for her mechanical valves and for atrial fibrillation. She has not had serious bleeding complications or embolic events. She does not have a history of stroke.  Past Medical History:  Diagnosis Date   Abnormality of gait 07/04/2016   Cerebral vascular disease    Coronary artery disease    Excessive daytime sleepiness 07/04/2016   Fatty liver    Gait abnormality    Gastric polyp    GERD (gastroesophageal reflux disease)    Hemorrhoids    Hiatal hernia    Hyperlipidemia    Hypersomnolence    Hypertension    Pacemaker july 2011   medtronic adapta model # ADDRL 1,SERIAL #[id number]   Paroxysmal atrial fibrillation (HCC)    Seizure disorder [facility])    Sick sinus syndrome (HCC)    Spinal stenosis    Tubular adenoma of colon 11/2010   UTI (urinary tract infection)     Past Surgical History:  Procedure Laterality Date   AORTIC AND MITRAL VALVE REPLACEMENT  1994   19mm St. Jude   BACK SURGERY     CARDIAC CATHETERIZATION  04/2010   DOPPLER ECHOCARDIOGRAPHY  sept 06, 2013   aotric valve grad. peak 51 mmHg and mean 28 mm Hg  dimensional subtractive index 0.26; mitral valve grad peak 25 and mean 7mm Hg norm pressure halftime .   MITRAL VALVE REPLACEMENT  1986   St Jude prosthesis    NM MYOCAR PERF WALL MOTION  03/31/2008   EF 69% low risk   NM MYOCAR PERF WALL MOTION  04/19/2006   EF 69%   PACEMAKER PLACEMENT  05/2010   TEE WITHOUT CARDIOVERSION  07/11/2012   Procedure: TRANSESOPHAGEAL ECHOCARDIOGRAM (TEE);  Surgeon: Thurmon Fair, MD;  Location: [facility] ENDOSCOPY;  Service: Cardiovascular;  Laterality: N/A;    Outpatient Medications Prior to Visit   Medication Sig Dispense Refill   acetaminophen (TYLENOL) 500 MG tablet Take 1,000 mg by mouth every 6 (six) hours as needed. For pain     alendronate (FOSAMAX) 70 MG tablet Take 70 mg by mouth every Sunday. Take with a full glass of water on an empty stomach. Takes on Sunday.     Ascorbic Acid (VITAMIN C) 1000 MG tablet Take 1,000 mg by mouth daily.     atorvastatin (LIPITOR) 40 MG tablet TAKE 1 TABLET BY MOUTH DAILY (Patient taking differently: Take 40 mg by mouth at bedtime.) 90 tablet 3   Cholecalciferol (VITAMIN D3) 2000 UNITS capsule Take 2,000 Units by mouth daily.     dorzolamide (TRUSOPT) 2 % ophthalmic solution Place 1 drop into the right eye 2 (two) times daily.     guaifenesin (ROBITUSSIN) 100 MG/5ML syrup Take 200 mg by mouth 3 (three) times daily as needed for cough.  latanoprost (XALATAN) 0.005 % ophthalmic solution Place 1 drop into both eyes at bedtime.     levETIRAcetam (KEPPRA) 500 MG tablet Take 500 mg by mouth 2 (two) times daily.     lisinopril (ZESTRIL) 30 MG tablet TAKE 1 TABLET BY MOUTH EVERY DAY 90 tablet 3   vitamin B-12 (CYANOCOBALAMIN) 1000 MCG tablet Take 1,000 mcg by mouth daily.     warfarin (COUMADIN) 2.5 MG tablet TAKE 2 TO 3 TABLETS BY MOUTH DAILY AS DIRECTED by coumadin clinic (Patient taking differently: Take 5 mg by mouth daily.) 210 tablet 1   dextromethorphan (DELSYM) 30 MG/5ML liquid Take 30 mg by mouth as needed for cough. (Patient not taking: Reported on 12/23/2023)     oseltamivir (TAMIFLU) 75 MG capsule Take 75 mg by mouth 2 (two) times daily. (Patient not taking: Reported on 12/23/2023)     No facility-administered medications prior to visit.     Allergies:   Shellfish allergy and Tegretol [carbamazepine]   Social History   Socioeconomic History   Marital status: Married    Spouse name: Not on file   Number of children: 1   Years of education: Not on file   Highest education level: Not on file  Occupational History   Occupation: DISABLED   Tobacco Use   Smoking status: Never   Smokeless tobacco: Never  Substance and Sexual Activity   Alcohol use: No   Drug use: No   Sexual activity: Not on file  Other Topics Concern   Not on file  Social History Narrative   Lives at home with her husband.   Right-handed.   16-20oz of caffeine per day.   Social Drivers of Corporate investment banker Strain: Not on file  Food Insecurity: Not on file  Transportation Needs: Not on file  Physical Activity: Not on file  Stress: Not on file  Social Connections: Not on file     Family History:  The patient's family history includes Colon cancer in her sister; Colon polyps in her sister; Heart disease in her mother.   ROS:   Please see the history of present illness.    ROS All other systems are reviewed and are negative.   PHYSICAL EXAM:   VS:  BP 122/60 (BP Location: Left Arm, Patient Position: Sitting)   Pulse 65   Wt 120 lb 9.6 oz (54.7 kg)   SpO2 95%   BMI 20.70 kg/m        General: Alert, oriented x3, no distress. Very slender.  Appears elderly and frail.  Healthy left subclavian pacemaker site. Head: no evidence of trauma, PERRL, EOMI, no exophtalmos or lid lag, no myxedema, no xanthelasma; normal ears, nose and oropharynx Neck: normal jugular venous pulsations and no hepatojugular reflux; brisk carotid pulses without delay and no carotid bruits Chest: clear to auscultation, no signs of consolidation by percussion or palpation, normal fremitus, symmetrical and full respiratory excursions Cardiovascular: normal position and quality of the apical impulse, regular rhythm, crisp mechanical valve clicks, no diastolic murmurs, rubs or gallops.  There is a 2/6 aortic ejection murmur that is early peaking with there is no apical rumble. Abdomen: no tenderness or distention, no masses by palpation, no abnormal pulsatility or arterial bruits, normal bowel sounds, no hepatosplenomegaly Extremities: no clubbing, cyanosis or edema;  2+ radial, ulnar and brachial pulses bilaterally; 2+ right femoral, posterior tibial and dorsalis pedis pulses; 2+ left femoral, posterior tibial and dorsalis pedis pulses; no subclavian or femoral bruits Neurological: grossly nonfocal  Psych: Normal mood and affect    Wt Readings from Last 3 Encounters:  12/23/23 120 lb 9.6 oz (54.7 kg)  07/11/23 133 lb 3.2 oz (60.4 kg)  06/29/22 110 lb 9.6 oz (50.2 kg)      Studies/Labs Reviewed:    EKG Interpretation Date/Time:  Monday December 23 2023 12:06:42 EST Ventricular Rate:  65 PR Interval:    QRS Duration:  182 QT Interval:  500 QTC Calculation: 520 R Axis:   -79  Text Interpretation: Ventricular-paced rhythm When compared with ECG of 11-Jul-2023 08:20, Vent. rate has decreased BY  10 BPM Confirmed by Demarkis Gheen ([id number]) on 12/23/2023 12:15:29 PM         Echocardiogram 06/26/2022    1. Left ventricular ejection fraction, by estimation, is 40 to 45%. The  left ventricle has mildly decreased function. The left ventricle  demonstrates global hypokinesis. Left ventricular diastolic function could  not be evaluated. Elevated left  ventricular end-diastolic pressure.   2. Right ventricular systolic function is mildly reduced. The right  ventricular size is normal. There is mildly elevated pulmonary artery  systolic pressure. The estimated right ventricular systolic pressure is  40.0 mmHg.   3. Left atrial size was moderately dilated.   4. Right atrial size was severely dilated.   5. The mitral valve has been repaired/replaced. There is a St. Jude  mechanical valve present in the mitral position.      Mitral regurgitation cannot be assessed due to LA shadowing from  mechanical MVR. No evidence of mitral stenosis. The mean mitral valve  gradient is 3.0 mmHg.   6. Tricuspid valve regurgitation is moderate.   7. The aortic valve has been repaired/replaced. Aortic valve  regurgitation is mild. No aortic stenosis is present.  There is a St. Jude  mechanical valve present in the aortic position. Aortic valve mean  gradient measures 19.5 mmHg. Aortic valve Vmax  measures 3.03 m/s.   8. Aortic dilatation noted. There is mild dilatation of the ascending  aorta, measuring 41 mm. There is mild dilatation of the aortic root,  measuring 39 mm.   9. The inferior vena cava is dilated in size with <50% respiratory  variability, suggesting right atrial pressure of 15 mmHg.   Comparison(s): 06/29/20 EF 45-50%. MV mean PG, peak PG. AV  mean PG, peak PG.   EKG:  EKG is ordered today.  There is background atrial fibrillation and ventricular paced rhythm, distinct positive R wave in leads V1-V2, suggesting possible epicardial location of the electrode  Recent Labs: December 29, 2020 Cholesterol 136, HDL 57, LDL 62, triglycerides 87  Mar 29, 2021 Hemoglobin 12.0, creatinine 0.78, ALT 14, TSH 3.19  Mar 28, 2022 Cholesterol 151, HDL 73, LDL 62, triglycerides 86 Hemoglobin 12.5, creatinine 0.8, potassium 4.2, ALT 20 TSH 1.78 on 12/28/2021  03/26/2023 Cholesterol 151, HDL 69, LDL 62, triglycerides 74 Creatinine 1.03, potassium 4.5, TSH 0.9275  Comprehensive pacemaker interrogation performed today confirms battery at ERI, normal ventricular lead sensing, impedance and capture thresholds.  Good heart rate histogram distribution.  There have been a few episodes of high ventricular rate.  \Some of the available electrograms are more likely consistent with RVR, rather than VT (but most are probably NSVT, all brief).  ASSESSMENT:    1. Permanent atrial fibrillation (HCC)   2. Atrial fibrillation with slow ventricular response (HCC)   3. Pacemaker battery depletion   4. Ventricular tachycardia (HCC)   5. Hx of mechanical aortic valve  replacement   6. History of mitral valve replacement   7. Acquired thrombophilia (HCC)   8. Essential hypertension   9. Underweight   10. Falls frequently       PLAN:  In order of problems listed above:  Permanent atrial fibrillation: She has slow ventricular response and 100% ventricular paced rhythm.  Anticoagulated for mechanical valve prostheses as well. PPM: She is not pacemaker dependent.  Generator at [employer].  Will schedule for generator change out.  Discussed the increased risk of bleeding since she needs to remain on anticoagulation, but would like the INR to be less than 2.5 presently greater than 4.0. Also discussed the fact that she is at high risk for infection and is at particularly high risk of serious complications due to infection.  Will go ahead and also use an antibiotic pouch. The pacemaker generator change procedure has been fully reviewed with the patient and her family and informed consent has been obtained. NSVT: Asymptomatic episodes continue to occur roughly 2-3 times a month a pattern that has not changed for many years. Mechanical AVR: She has a moderate degree of patient/prosthesis mismatch with a mean gradient that has gradually increased over the years and is now around 20 mmHg so.  Plan to repeat her echo later this year.  Suspect that the gradients are overestimated due to some degree of pressure recovery this patient with small aortic root size.  She has been asymptomatic, but this may also be due to her very sedentary lifestyle.  Either way prefer a conservative approach in view of her age and comorbid conditions.  She is by no means a candidate for redo valve surgery.  She is aware of the need for endocarditis prophylaxis.   Mechanical MVR: Normal prosthetic valve function.  Due for an echo later this year. Warfarin: Supratherapeutic today. She will hold warfarin between now and Wednesday and we will recheck her INR in the ED and anticoagulation clinic on Wednesday afternoon.  If the INR is still greater than 2.7, we will have to delay the procedure.  If the INR is 2.6 or lower, we will go ahead with generator change out the  next day and then will resume her warfarin.   HTN: Very well-controlled on lisinopril.  Continue Weight loss: Losing weight again.  Recently this has been following for a episode of influenza and decreased appetite. Falls: Thankfully she has not had any falls in over a year now.   Medication Adjustments/Labs and Tests Ordered: Current medicines are reviewed at length with the patient today.  Concerns regarding medicines are outlined above.  Medication changes, Labs and Tests ordered today are listed in the Patient Instructions below. Patient Instructions   Medication Instructions:  No changes.  *If you need a refill on your cardiac medications before your next appointment, please call your pharmacy*   Lab Work: CBC, BMET today.  If you have labs (blood work) drawn today and your tests are completely normal, you will receive your results only by: MyChart Message (if you have MyChart) OR A paper copy in the mail If you have any lab test that is abnormal or we need to change your treatment, we will call you to review the results.      Implantable Device Instructions    Alice Ballard  12/23/2023  You are scheduled for a PPM generator change on Thursday, February 20 with Dr. Rachelle Hora Nkechi Linehan.  1. Pre procedure Lab testing: to be done today in the office.  2. Please arrive at the Main Entrance A at [facility]: [address] [place], [place] [address] on February 20 at 1:30 PM (This time is two hours before your procedure to ensure your preparation). Free valet parking service is available. You will check in at ADMITTING. The support person will be asked to wait in the waiting room.  It is OK to have someone drop you off and come back when you are ready to be discharged.        Special note: Every effort is made to have your procedure done on time. Please understand that emergencies sometimes delay  scheduled procedures.  3.  No eating or drinking after midnight prior to  procedure.     4.  Medication instructions: Hold your coumadin from today until Wednesday after INR result is available. Then they will tell you to resume coumadin.    5.  The night before your procedure and the morning of your procedure scrub your neck/chest with CHG surgical scrub.  See instruction letter.  6. Plan to go home the same day, you will only stay overnight if medically necessary. 7.  You MUST have a responsible adult to drive you home. 8.   An adult MUST be with you the first 24 hours after you arrive home. 9..  Bring a current list of your medications, and the last time and date medication taken. 10. Bring ID and current insurance cards. 11. .Please wear clothes that are easy to get on and off and wear slip-on shoes.    You will follow up with the [facility] clinic 10-14 days after your procedure.  You will follow up with Dr. Rachelle Hora Brennin Durfee 91 days after your procedure.  These appointments will be made for you.   * If you have ANY questions after you get home, please call the office at [phone] or send a MyChart message.  FYI: For your safety, and to allow [place] to monitor your vital signs accurately during the surgery/procedure we request that if you have artificial nails, gel coating, SNS etc. Please have those removed prior to your surgery/procedure. Not having the nail coverings /polish removed may result in cancellation or delay of your surgery/procedure.    [place] - Preparing For Surgery    Before surgery, you can play an important role. Because skin is not sterile, your skin needs to be as free of germs as possible. You can reduce the number of germs on your skin by washing with CHG (chlorahexidine gluconate) Soap before surgery.  CHG is an antiseptic cleaner which kills germs and bonds with the skin to continue killing germs even after washing.  Please do not use if you have an allergy to CHG or antibacterial soaps.  If your skin becomes reddened/irritated  stop using the CHG.   Do not shave (including legs and underarms) for at least 48 hours prior to first CHG shower.  It is OK to shave your face.  Please follow these instructions carefully:  1.  Shower the night before surgery and the morning of surgery with CHG.  2.  If you choose to wash your hair, wash your hair first as usual with your normal shampoo.  3.  After you shampoo, rinse your hair and body thoroughly to remove the shampoo.  4.  Use CHG as you would any other liquid soap.  You can apply CHG directly to the skin and wash gently with a clean washcloth. 5.  Apply the  CHG Soap to your body ONLY FROM THE NECK DOWN.  Do not use on open wounds or open sores.  Avoid contact with your eyes, ears, mouth and genitals (private parts).  Wash genitals (private parts) with your normal soap.  6.  Wash thoroughly, paying special attention to the area where your surgery will be performed.  7.  Thoroughly rinse your body with warm water from the neck down.   8.  DO NOT shower/wash with your normal soap after using and rinsing off the CHG soap.  9.  Pat yourself dry with a clean towel.           10.  Wear clean pajamas.           11.  Place clean sheets on your bed the night of your first shower and do not sleep with pets.  Day of Surgery: Do not apply any deodorants/lotions.  Please wear clean clothes to the hospital/surgery center.   Follow-Up: At [facility], you and your health needs are our priority.  As part of our continuing mission to provide you with exceptional heart care, we have created designated Provider Care Teams.  These Care Teams include your primary Cardiologist (physician) and Advanced Practice Providers (APPs -  Physician Assistants and Nurse Practitioners) who all work together to provide you with the care you need, when you need it.  We recommend signing up for the patient portal called "MyChart".  Sign up information is provided on this After Visit Summary.  MyChart  is used to connect with patients for Virtual Visits (Telemedicine).  Patients are able to view lab/test results, encounter notes, upcoming appointments, etc.  Non-urgent messages can be sent to your provider as well.   To learn more about what you can do with MyChart, go to [email or web address].    Your next appointment:   3 month(s)  Provider:   Thurmon Fair, MD     Other Instructions           Signed, Thurmon Fair, MD  12/23/2023 4:30 PM    [facility] [address] [place], [place], [place]  [address] Phone: [phone]; Fax: [phone]

## 2023-12-23 NOTE — Patient Instructions (Signed)
 Medication Instructions:  No changes.  *If you need a refill on your cardiac medications before your next appointment, please call your pharmacy*   Lab Work: CBC, BMET today.  If you have labs (blood work) drawn today and your tests are completely normal, you will receive your results only by: MyChart Message (if you have MyChart) OR A paper copy in the mail If you have any lab test that is abnormal or we need to change your treatment, we will call you to review the results.      Implantable Device Instructions    NYLA CREASON  12/23/2023  You are scheduled for a PPM generator change on Thursday, February 20 with Dr. Rachelle Hora Croitoru.  1. Pre procedure Lab testing: to be done today in the office.    2. Please arrive at the Main Entrance A at [facility]: [address] [place], [place] [address] on February 20 at 1:30 PM (This time is two hours before your procedure to ensure your preparation). Free valet parking service is available. You will check in at ADMITTING. The support person will be asked to wait in the waiting room.  It is OK to have someone drop you off and come back when you are ready to be discharged.        Special note: Every effort is made to have your procedure done on time. Please understand that emergencies sometimes delay  scheduled procedures.  3.  No eating or drinking after midnight prior to procedure.     4.  Medication instructions: Hold your coumadin from today until Wednesday after INR result is available. Then they will tell you to resume coumadin.    5.  The night before your procedure and the morning of your procedure scrub your neck/chest with CHG surgical scrub.  See instruction letter.  6. Plan to go home the same day, you will only stay overnight if medically necessary. 7.  You MUST have a responsible adult to drive you home. 8.   An adult MUST be with you the first 24 hours after you arrive home. 9..  Bring a current list of your  medications, and the last time and date medication taken. 10. Bring ID and current insurance cards. 11. .Please wear clothes that are easy to get on and off and wear slip-on shoes.    You will follow up with the [facility] Device clinic 10-14 days after your procedure.  You will follow up with Dr. Rachelle Hora Croitoru 91 days after your procedure.  These appointments will be made for you.   * If you have ANY questions after you get home, please call the office at [phone] or send a MyChart message.  FYI: For your safety, and to allow [place] to monitor your vital signs accurately during the surgery/procedure we request that if you have artificial nails, gel coating, SNS etc. Please have those removed prior to your surgery/procedure. Not having the nail coverings /polish removed may result in cancellation or delay of your surgery/procedure.    [facility] - Preparing For Surgery    Before surgery, you can play an important role. Because skin is not sterile, your skin needs to be as free of germs as possible. You can reduce the number of germs on your skin by washing with CHG (chlorahexidine gluconate) Soap before surgery.  CHG is an antiseptic cleaner which kills germs and bonds with the skin to continue killing germs even after washing.  Please do not  use if you have an allergy to CHG or antibacterial soaps.  If your skin becomes reddened/irritated stop using the CHG.   Do not shave (including legs and underarms) for at least 48 hours prior to first CHG shower.  It is OK to shave your face.  Please follow these instructions carefully:  1.  Shower the night before surgery and the morning of surgery with CHG.  2.  If you choose to wash your hair, wash your hair first as usual with your normal shampoo.  3.  After you shampoo, rinse your hair and body thoroughly to remove the shampoo.  4.  Use CHG as you would any other liquid soap.  You can apply CHG directly to the skin and wash gently with a clean  washcloth. 5.  Apply the CHG Soap to your body ONLY FROM THE NECK DOWN.  Do not use on open wounds or open sores.  Avoid contact with your eyes, ears, mouth and genitals (private parts).  Wash genitals (private parts) with your normal soap.  6.  Wash thoroughly, paying special attention to the area where your surgery will be performed.  7.  Thoroughly rinse your body with warm water from the neck down.   8.  DO NOT shower/wash with your normal soap after using and rinsing off the CHG soap.  9.  Pat yourself dry with a clean towel.           10.  Wear clean pajamas.           11.  Place clean sheets on your bed the night of your first shower and do not sleep with pets.  Day of Surgery: Do not apply any deodorants/lotions.  Please wear clean clothes to the hospital/surgery center.   Follow-Up: At [facility], you and your health needs are our priority.  As part of our continuing mission to provide you with exceptional heart care, we have created designated Provider Care Teams.  These Care Teams include your primary Cardiologist (physician) and Advanced Practice Providers (APPs -  Physician Assistants and Nurse Practitioners) who all work together to provide you with the care you need, when you need it.  We recommend signing up for the patient portal called "MyChart".  Sign up information is provided on this After Visit Summary.  MyChart is used to connect with patients for Virtual Visits (Telemedicine).  Patients are able to view lab/test results, encounter notes, upcoming appointments, etc.  Non-urgent messages can be sent to your provider as well.   To learn more about what you can do with MyChart, go to [email or web address].    Your next appointment:   3 month(s)  Provider:   Thurmon Fair, MD     Other Instructions

## 2023-12-23 NOTE — Patient Instructions (Signed)
 Hold warfarin today.  Pending generator change out on 12/26/23.  Has appt with Dr C today.  She will find out at appt today if she needs to continue holding warfarin before procedure.  After procedure she will resume warfarin 2 tablets daily Recheck in 2 weeks

## 2023-12-23 NOTE — Progress Notes (Signed)
 Patient ID: Alice Ballard, female   DOB: 1941/07/23, 83 y.o.   MRN: [id number]    Cardiology Office Note    Date:  12/23/2023   ID:  Alice Ballard, DOB Nov 14, 1940, MRN [id number]  PCP:  Juliette Alcide, MD  Cardiologist:   Thurmon Fair, MD   Chief Complaint  Patient presents with   Pacemaker Problem    History of Present Illness:  Alice Ballard is a 83 y.o. 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   The patient is a repeat elective replacement indicator on 11/26/2023 and she is here to discuss generator change out.  She is not pacemaker dependent but has virtually 100% ventricular pacing due to slow ventricular response.  She has been doing otherwise roughly the same until last week when she was diagnosed with the flu.  She has just had general lassitude and poor appetite, but has not had shortness of breath or severe cough..  She is quite sedentary.  She has not had any recent falls and she is using her walker consistently.  She has lost weight again and her BMI is down to about 21.  She has not had palpitations, dizziness or syncope and denies shortness of breath or chest pain at rest or with mild activity that she performs.  She occasionally has ankle swelling at the end of the day, but she does not have orthopnea or PND.  She has not had any serious bleeding problems.  Earlier today her INR was 4.2, but this is unusual for her.  Her INR has been between 2.0 and 3.0 over the last 11 months.  She is doing quite well from a cardiovascular point of view, but remains quite sedentary.  Thankfully she has not had any new falls since May 2023.  She is using her walker consistently.  She has managed to gain some weight and now has an  optimal BMI just under 23.  She has not had problems with orthopnea, PND, chest pain, palpitations, syncope.  She has mild ankle edema especially towards the end of the day.  She does not have any active bleeding issues.  INR has largely been in therapeutic range, with only 1 subtherapeutic reading during the last year.  Pacemaker interrogation shows normal device function.  She has atrial fibrillation with slow ventricular response, in the 30-40 bpm range.  Even when the lower rate limit was set at 50 beats a minute she still had virtually 100% ventricular pacing.  She is now programmed with a lower rate limit of 70 bpm.  She has had a handful of brief episodes of nonsustained ventricular tachycardia, up to 6 seconds and maximum duration, not particularly fast (usually 160 bpm) and asymptomatic.  Lead parameters are excellent  The echocardiogram performed 06/26/2022 shows similar mildly decreased left ventricular ejection fraction of 40-45% (last year 45-50%) and stable gradients across both the aortic and mitral valve prostheses (she has always had a mildly increased aortic valve mean gradient at about 15-20 mmHg, likely a degree of prosthesis mismatch).  Once again it is noted today that she seems to be having cognitive dysfunction as she has difficulty participating in the conversation and asks repetitive questions.  Maybe there is some issues with hearing as well.  Alice Ballard is a 83 year old woman with a  mechanical aortic and mitral valve prostheses as well as long-term persistent atrial fibrillation, hypertension, dyslipidemia. In 1986 her mitral valve was replaced with a 29 mm St. Jude prosthesis for rheumatic disease. In 1994 her aortic valve was replaced with a 19 mm St. Jude device. Has chronic moderate elevation in the valvular gradients across the aortic valve, likely an expression of patient-valve mismatch. Fluoroscopy has shown normal disc motion. Previous right heart catheterization had shown  only very mild pulmonary hypertension (mean PA pressure 25 mm Hg, wedge pressure 16 mm Hg) She has no history of coronary artery disease. She has a dual-chamber permanent pacemaker (Medtronic) that was implanted in 2011 for tachycardia-bradycardia syndrome, now programmed VVIR, virtually 100% V paced. She is on chronic warfarin anticoagulation both for her mechanical valves and for atrial fibrillation. She has not had serious bleeding complications or embolic events. She does not have a history of stroke.  Past Medical History:  Diagnosis Date   Abnormality of gait 07/04/2016   Cerebral vascular disease    Coronary artery disease    Excessive daytime sleepiness 07/04/2016   Fatty liver    Gait abnormality    Gastric polyp    GERD (gastroesophageal reflux disease)    Hemorrhoids    Hiatal hernia    Hyperlipidemia    Hypersomnolence    Hypertension    Pacemaker july 2011   medtronic adapta model # ADDRL 1,SERIAL #[id number]   Paroxysmal atrial fibrillation (HCC)    Seizure disorder [facility])    Sick sinus syndrome (HCC)    Spinal stenosis    Tubular adenoma of colon 11/2010   UTI (urinary tract infection)     Past Surgical History:  Procedure Laterality Date   AORTIC AND MITRAL VALVE REPLACEMENT  1994   19mm St. Jude   BACK SURGERY     CARDIAC CATHETERIZATION  04/2010   DOPPLER ECHOCARDIOGRAPHY  sept 06, 2013   aotric valve grad. peak 51 mmHg and mean 28 mm Hg  dimensional subtractive index 0.26; mitral valve grad peak 25 and mean 7mm Hg norm pressure halftime .   MITRAL VALVE REPLACEMENT  1986   St Jude prosthesis    NM MYOCAR PERF WALL MOTION  03/31/2008   EF 69% low risk   NM MYOCAR PERF WALL MOTION  04/19/2006   EF 69%   PACEMAKER PLACEMENT  05/2010   TEE WITHOUT CARDIOVERSION  07/11/2012   Procedure: TRANSESOPHAGEAL ECHOCARDIOGRAM (TEE);  Surgeon: Thurmon Fair, MD;  Location: [facility] ENDOSCOPY;  Service: Cardiovascular;  Laterality: N/A;    Outpatient Medications Prior to Visit   Medication Sig Dispense Refill   acetaminophen (TYLENOL) 500 MG tablet Take 1,000 mg by mouth every 6 (six) hours as needed. For pain     alendronate (FOSAMAX) 70 MG tablet Take 70 mg by mouth every Sunday. Take with a full glass of water on an empty stomach. Takes on Sunday.     Ascorbic Acid (VITAMIN C) 1000 MG tablet Take 1,000 mg by mouth daily.     atorvastatin (LIPITOR) 40 MG tablet TAKE 1 TABLET BY MOUTH DAILY (Patient taking differently: Take 40 mg by mouth at bedtime.) 90 tablet 3   Cholecalciferol (VITAMIN D3) 2000 UNITS capsule Take 2,000 Units by mouth daily.     dorzolamide (TRUSOPT) 2 % ophthalmic solution Place 1 drop into the right eye 2 (two) times daily.     guaifenesin (ROBITUSSIN) 100 MG/5ML syrup Take 200 mg by mouth 3 (three) times daily as needed for cough.  latanoprost (XALATAN) 0.005 % ophthalmic solution Place 1 drop into both eyes at bedtime.     levETIRAcetam (KEPPRA) 500 MG tablet Take 500 mg by mouth 2 (two) times daily.     lisinopril (ZESTRIL) 30 MG tablet TAKE 1 TABLET BY MOUTH EVERY DAY 90 tablet 3   vitamin B-12 (CYANOCOBALAMIN) 1000 MCG tablet Take 1,000 mcg by mouth daily.     warfarin (COUMADIN) 2.5 MG tablet TAKE 2 TO 3 TABLETS BY MOUTH DAILY AS DIRECTED by coumadin clinic (Patient taking differently: Take 5 mg by mouth daily.) 210 tablet 1   dextromethorphan (DELSYM) 30 MG/5ML liquid Take 30 mg by mouth as needed for cough. (Patient not taking: Reported on 12/23/2023)     oseltamivir (TAMIFLU) 75 MG capsule Take 75 mg by mouth 2 (two) times daily. (Patient not taking: Reported on 12/23/2023)     No facility-administered medications prior to visit.     Allergies:   Shellfish allergy and Tegretol [carbamazepine]   Social History   Socioeconomic History   Marital status: Married    Spouse name: Not on file   Number of children: 1   Years of education: Not on file   Highest education level: Not on file  Occupational History   Occupation: DISABLED   Tobacco Use   Smoking status: Never   Smokeless tobacco: Never  Substance and Sexual Activity   Alcohol use: No   Drug use: No   Sexual activity: Not on file  Other Topics Concern   Not on file  Social History Narrative   Lives at home with her husband.   Right-handed.   16-20oz of caffeine per day.   Social Drivers of Corporate investment banker Strain: Not on file  Food Insecurity: Not on file  Transportation Needs: Not on file  Physical Activity: Not on file  Stress: Not on file  Social Connections: Not on file     Family History:  The patient's family history includes Colon cancer in her sister; Colon polyps in her sister; Heart disease in her mother.   ROS:   Please see the history of present illness.    ROS All other systems are reviewed and are negative.   PHYSICAL EXAM:   VS:  BP 122/60 (BP Location: Left Arm, Patient Position: Sitting)   Pulse 65   Wt 120 lb 9.6 oz (54.7 kg)   SpO2 95%   BMI 20.70 kg/m        General: Alert, oriented x3, no distress. Very slender.  Appears elderly and frail.  Healthy left subclavian pacemaker site. Head: no evidence of trauma, PERRL, EOMI, no exophtalmos or lid lag, no myxedema, no xanthelasma; normal ears, nose and oropharynx Neck: normal jugular venous pulsations and no hepatojugular reflux; brisk carotid pulses without delay and no carotid bruits Chest: clear to auscultation, no signs of consolidation by percussion or palpation, normal fremitus, symmetrical and full respiratory excursions Cardiovascular: normal position and quality of the apical impulse, regular rhythm, crisp mechanical valve clicks, no diastolic murmurs, rubs or gallops.  There is a 2/6 aortic ejection murmur that is early peaking with there is no apical rumble. Abdomen: no tenderness or distention, no masses by palpation, no abnormal pulsatility or arterial bruits, normal bowel sounds, no hepatosplenomegaly Extremities: no clubbing, cyanosis or edema;  2+ radial, ulnar and brachial pulses bilaterally; 2+ right femoral, posterior tibial and dorsalis pedis pulses; 2+ left femoral, posterior tibial and dorsalis pedis pulses; no subclavian or femoral bruits Neurological: grossly nonfocal  Psych: Normal mood and affect    Wt Readings from Last 3 Encounters:  12/23/23 120 lb 9.6 oz (54.7 kg)  07/11/23 133 lb 3.2 oz (60.4 kg)  06/29/22 110 lb 9.6 oz (50.2 kg)      Studies/Labs Reviewed:    EKG Interpretation Date/Time:  Monday December 23 2023 12:06:42 EST Ventricular Rate:  65 PR Interval:    QRS Duration:  182 QT Interval:  500 QTC Calculation: 520 R Axis:   -79  Text Interpretation: Ventricular-paced rhythm When compared with ECG of 11-Jul-2023 08:20, Vent. rate has decreased BY  10 BPM Confirmed by Demarkis Gheen ([id number]) on 12/23/2023 12:15:29 PM         Echocardiogram 06/26/2022    1. Left ventricular ejection fraction, by estimation, is 40 to 45%. The  left ventricle has mildly decreased function. The left ventricle  demonstrates global hypokinesis. Left ventricular diastolic function could  not be evaluated. Elevated left  ventricular end-diastolic pressure.   2. Right ventricular systolic function is mildly reduced. The right  ventricular size is normal. There is mildly elevated pulmonary artery  systolic pressure. The estimated right ventricular systolic pressure is  40.0 mmHg.   3. Left atrial size was moderately dilated.   4. Right atrial size was severely dilated.   5. The mitral valve has been repaired/replaced. There is a St. Jude  mechanical valve present in the mitral position.      Mitral regurgitation cannot be assessed due to LA shadowing from  mechanical MVR. No evidence of mitral stenosis. The mean mitral valve  gradient is 3.0 mmHg.   6. Tricuspid valve regurgitation is moderate.   7. The aortic valve has been repaired/replaced. Aortic valve  regurgitation is mild. No aortic stenosis is present.  There is a St. Jude  mechanical valve present in the aortic position. Aortic valve mean  gradient measures 19.5 mmHg. Aortic valve Vmax  measures 3.03 m/s.   8. Aortic dilatation noted. There is mild dilatation of the ascending  aorta, measuring 41 mm. There is mild dilatation of the aortic root,  measuring 39 mm.   9. The inferior vena cava is dilated in size with <50% respiratory  variability, suggesting right atrial pressure of 15 mmHg.   Comparison(s): 06/29/20 EF 45-50%. MV mean PG, peak PG. AV  mean PG, peak PG.   EKG:  EKG is ordered today.  There is background atrial fibrillation and ventricular paced rhythm, distinct positive R wave in leads V1-V2, suggesting possible epicardial location of the electrode  Recent Labs: December 29, 2020 Cholesterol 136, HDL 57, LDL 62, triglycerides 87  Mar 29, 2021 Hemoglobin 12.0, creatinine 0.78, ALT 14, TSH 3.19  Mar 28, 2022 Cholesterol 151, HDL 73, LDL 62, triglycerides 86 Hemoglobin 12.5, creatinine 0.8, potassium 4.2, ALT 20 TSH 1.78 on 12/28/2021  03/26/2023 Cholesterol 151, HDL 69, LDL 62, triglycerides 74 Creatinine 1.03, potassium 4.5, TSH 0.9275  Comprehensive pacemaker interrogation performed today confirms battery at ERI, normal ventricular lead sensing, impedance and capture thresholds.  Good heart rate histogram distribution.  There have been a few episodes of high ventricular rate.  \Some of the available electrograms are more likely consistent with RVR, rather than VT (but most are probably NSVT, all brief).  ASSESSMENT:    1. Permanent atrial fibrillation (HCC)   2. Atrial fibrillation with slow ventricular response (HCC)   3. Pacemaker battery depletion   4. Ventricular tachycardia (HCC)   5. Hx of mechanical aortic valve  replacement   6. History of mitral valve replacement   7. Acquired thrombophilia (HCC)   8. Essential hypertension   9. Underweight   10. Falls frequently       PLAN:  In order of problems listed above:  Permanent atrial fibrillation: She has slow ventricular response and 100% ventricular paced rhythm.  Anticoagulated for mechanical valve prostheses as well. PPM: She is not pacemaker dependent.  Generator at [employer].  Will schedule for generator change out.  Discussed the increased risk of bleeding since she needs to remain on anticoagulation, but would like the INR to be less than 2.5 presently greater than 4.0. Also discussed the fact that she is at high risk for infection and is at particularly high risk of serious complications due to infection.  Will go ahead and also use an antibiotic pouch. The pacemaker generator change procedure has been fully reviewed with the patient and her family and informed consent has been obtained. NSVT: Asymptomatic episodes continue to occur roughly 2-3 times a month a pattern that has not changed for many years. Mechanical AVR: She has a moderate degree of patient/prosthesis mismatch with a mean gradient that has gradually increased over the years and is now around 20 mmHg so.  Plan to repeat her echo later this year.  Suspect that the gradients are overestimated due to some degree of pressure recovery this patient with small aortic root size.  She has been asymptomatic, but this may also be due to her very sedentary lifestyle.  Either way prefer a conservative approach in view of her age and comorbid conditions.  She is by no means a candidate for redo valve surgery.  She is aware of the need for endocarditis prophylaxis.   Mechanical MVR: Normal prosthetic valve function.  Due for an echo later this year. Warfarin: Supratherapeutic today. She will hold warfarin between now and Wednesday and we will recheck her INR in the ED and anticoagulation clinic on Wednesday afternoon.  If the INR is still greater than 2.7, we will have to delay the procedure.  If the INR is 2.6 or lower, we will go ahead with generator change out the  next day and then will resume her warfarin.   HTN: Very well-controlled on lisinopril.  Continue Weight loss: Losing weight again.  Recently this has been following for a episode of influenza and decreased appetite. Falls: Thankfully she has not had any falls in over a year now.   Medication Adjustments/Labs and Tests Ordered: Current medicines are reviewed at length with the patient today.  Concerns regarding medicines are outlined above.  Medication changes, Labs and Tests ordered today are listed in the Patient Instructions below. Patient Instructions   Medication Instructions:  No changes.  *If you need a refill on your cardiac medications before your next appointment, please call your pharmacy*   Lab Work: CBC, BMET today.  If you have labs (blood work) drawn today and your tests are completely normal, you will receive your results only by: MyChart Message (if you have MyChart) OR A paper copy in the mail If you have any lab test that is abnormal or we need to change your treatment, we will call you to review the results.      Implantable Device Instructions    Alice Ballard  12/23/2023  You are scheduled for a PPM generator change on Thursday, February 20 with Dr. Rachelle Hora Nkechi Linehan.  1. Pre procedure Lab testing: to be done today in the office.  2. Please arrive at the Main Entrance A at [facility]: [address] [place], [place] [address] on February 20 at 1:30 PM (This time is two hours before your procedure to ensure your preparation). Free valet parking service is available. You will check in at ADMITTING. The support person will be asked to wait in the waiting room.  It is OK to have someone drop you off and come back when you are ready to be discharged.        Special note: Every effort is made to have your procedure done on time. Please understand that emergencies sometimes delay  scheduled procedures.  3.  No eating or drinking after midnight prior to  procedure.     4.  Medication instructions: Hold your coumadin from today until Wednesday after INR result is available. Then they will tell you to resume coumadin.    5.  The night before your procedure and the morning of your procedure scrub your neck/chest with CHG surgical scrub.  See instruction letter.  6. Plan to go home the same day, you will only stay overnight if medically necessary. 7.  You MUST have a responsible adult to drive you home. 8.   An adult MUST be with you the first 24 hours after you arrive home. 9..  Bring a current list of your medications, and the last time and date medication taken. 10. Bring ID and current insurance cards. 11. .Please wear clothes that are easy to get on and off and wear slip-on shoes.    You will follow up with the [facility] clinic 10-14 days after your procedure.  You will follow up with Dr. Rachelle Hora Brennin Durfee 91 days after your procedure.  These appointments will be made for you.   * If you have ANY questions after you get home, please call the office at [phone] or send a MyChart message.  FYI: For your safety, and to allow [place] to monitor your vital signs accurately during the surgery/procedure we request that if you have artificial nails, gel coating, SNS etc. Please have those removed prior to your surgery/procedure. Not having the nail coverings /polish removed may result in cancellation or delay of your surgery/procedure.    [place] - Preparing For Surgery    Before surgery, you can play an important role. Because skin is not sterile, your skin needs to be as free of germs as possible. You can reduce the number of germs on your skin by washing with CHG (chlorahexidine gluconate) Soap before surgery.  CHG is an antiseptic cleaner which kills germs and bonds with the skin to continue killing germs even after washing.  Please do not use if you have an allergy to CHG or antibacterial soaps.  If your skin becomes reddened/irritated  stop using the CHG.   Do not shave (including legs and underarms) for at least 48 hours prior to first CHG shower.  It is OK to shave your face.  Please follow these instructions carefully:  1.  Shower the night before surgery and the morning of surgery with CHG.  2.  If you choose to wash your hair, wash your hair first as usual with your normal shampoo.  3.  After you shampoo, rinse your hair and body thoroughly to remove the shampoo.  4.  Use CHG as you would any other liquid soap.  You can apply CHG directly to the skin and wash gently with a clean washcloth. 5.  Apply the  CHG Soap to your body ONLY FROM THE NECK DOWN.  Do not use on open wounds or open sores.  Avoid contact with your eyes, ears, mouth and genitals (private parts).  Wash genitals (private parts) with your normal soap.  6.  Wash thoroughly, paying special attention to the area where your surgery will be performed.  7.  Thoroughly rinse your body with warm water from the neck down.   8.  DO NOT shower/wash with your normal soap after using and rinsing off the CHG soap.  9.  Pat yourself dry with a clean towel.           10.  Wear clean pajamas.           11.  Place clean sheets on your bed the night of your first shower and do not sleep with pets.  Day of Surgery: Do not apply any deodorants/lotions.  Please wear clean clothes to the hospital/surgery center.   Follow-Up: At [facility], you and your health needs are our priority.  As part of our continuing mission to provide you with exceptional heart care, we have created designated Provider Care Teams.  These Care Teams include your primary Cardiologist (physician) and Advanced Practice Providers (APPs -  Physician Assistants and Nurse Practitioners) who all work together to provide you with the care you need, when you need it.  We recommend signing up for the patient portal called "MyChart".  Sign up information is provided on this After Visit Summary.  MyChart  is used to connect with patients for Virtual Visits (Telemedicine).  Patients are able to view lab/test results, encounter notes, upcoming appointments, etc.  Non-urgent messages can be sent to your provider as well.   To learn more about what you can do with MyChart, go to [email or web address].    Your next appointment:   3 month(s)  Provider:   Thurmon Fair, MD     Other Instructions           Signed, Thurmon Fair, MD  12/23/2023 4:30 PM    [facility] [address] [place], [place], [place]  [address] Phone: [phone]; Fax: [phone]

## 2023-12-24 ENCOUNTER — Encounter: Payer: Medicare Other | Admitting: Cardiovascular Disease

## 2023-12-24 LAB — BASIC METABOLIC PANEL
BUN/Creatinine Ratio: 20 (ref 12–28)
BUN: 19 mg/dL (ref 8–27)
CO2: 23 mmol/L (ref 20–29)
Calcium: 9.1 mg/dL (ref 8.7–10.3)
Chloride: 109 mmol/L — ABNORMAL HIGH (ref 96–106)
Creatinine, Ser: 0.95 mg/dL (ref 0.57–1.00)
Glucose: 86 mg/dL (ref 70–99)
Potassium: 4.6 mmol/L (ref 3.5–5.2)
Sodium: 144 mmol/L (ref 134–144)
eGFR: 60 mL/min/{1.73_m2} (ref >59)

## 2023-12-24 LAB — CBC
Hematocrit: 36.9 % (ref 34.0–46.6)
Hemoglobin: 12 g/dL (ref 11.1–15.9)
MCH: 30.9 pg (ref 26.6–33.0)
MCHC: 32.5 g/dL (ref 31.5–35.7)
MCV: 95 fL (ref 79–97)
Platelets: 151 10*3/uL (ref 150–450)
RBC: 3.88 x10E6/uL (ref 3.77–5.28)
RDW: 11.8 % (ref 11.7–15.4)
WBC: 4.1 10*3/uL (ref 3.4–10.8)

## 2023-12-25 ENCOUNTER — Ambulatory Visit: Payer: Medicare Other

## 2023-12-25 ENCOUNTER — Ambulatory Visit: Payer: Medicare Other | Attending: Cardiology | Admitting: *Deleted

## 2023-12-25 DIAGNOSIS — Z5181 Encounter for therapeutic drug level monitoring: Secondary | ICD-10-CM | POA: Diagnosis not present

## 2023-12-25 DIAGNOSIS — I4891 Unspecified atrial fibrillation: Secondary | ICD-10-CM

## 2023-12-25 DIAGNOSIS — Z952 Presence of prosthetic heart valve: Secondary | ICD-10-CM | POA: Diagnosis not present

## 2023-12-25 LAB — POCT INR: INR: 2 (ref 2.0–3.0)

## 2023-12-25 NOTE — Patient Instructions (Addendum)
 Take warfarin 2.5mg  today then resume 2 tablets daily after procedure Pending generator change out on 12/26/23.  Dose confirmed with Dr Royann Shivers.  Recheck in 2 weeks

## 2023-12-26 ENCOUNTER — Encounter (HOSPITAL_COMMUNITY)
Admission: RE | Disposition: A | Discharge status: Home / Self Care | Payer: Medicare Other | Source: Home / Self Care | Attending: Cardiovascular Disease

## 2023-12-26 ENCOUNTER — Ambulatory Visit (HOSPITAL_COMMUNITY)
Admission: RE | Admit: 2023-12-26 | Discharge: 2023-12-26 | Disposition: A | Discharge status: Home / Self Care | Payer: Medicare Other | Attending: Cardiovascular Disease | Admitting: Cardiovascular Disease

## 2023-12-26 ENCOUNTER — Other Ambulatory Visit: Payer: Self-pay

## 2023-12-26 DIAGNOSIS — Z79899 Other long term (current) drug therapy: Secondary | ICD-10-CM | POA: Insufficient documentation

## 2023-12-26 DIAGNOSIS — Z7901 Long term (current) use of anticoagulants: Secondary | ICD-10-CM | POA: Diagnosis not present

## 2023-12-26 DIAGNOSIS — I472 Ventricular tachycardia, unspecified: Secondary | ICD-10-CM | POA: Insufficient documentation

## 2023-12-26 DIAGNOSIS — I4821 Permanent atrial fibrillation: Secondary | ICD-10-CM | POA: Diagnosis not present

## 2023-12-26 DIAGNOSIS — Z952 Presence of prosthetic heart valve: Secondary | ICD-10-CM | POA: Insufficient documentation

## 2023-12-26 DIAGNOSIS — D6859 Other primary thrombophilia: Secondary | ICD-10-CM | POA: Insufficient documentation

## 2023-12-26 DIAGNOSIS — Z6820 Body mass index (BMI) 20.0-20.9, adult: Secondary | ICD-10-CM | POA: Insufficient documentation

## 2023-12-26 DIAGNOSIS — R296 Repeated falls: Secondary | ICD-10-CM | POA: Diagnosis not present

## 2023-12-26 DIAGNOSIS — I4891 Unspecified atrial fibrillation: Secondary | ICD-10-CM

## 2023-12-26 DIAGNOSIS — E785 Hyperlipidemia, unspecified: Secondary | ICD-10-CM | POA: Insufficient documentation

## 2023-12-26 DIAGNOSIS — R636 Underweight: Secondary | ICD-10-CM | POA: Insufficient documentation

## 2023-12-26 DIAGNOSIS — I10 Essential (primary) hypertension: Secondary | ICD-10-CM | POA: Insufficient documentation

## 2023-12-26 DIAGNOSIS — I495 Sick sinus syndrome: Secondary | ICD-10-CM | POA: Diagnosis not present

## 2023-12-26 DIAGNOSIS — Z45010 Encounter for checking and testing of cardiac pacemaker pulse generator [battery]: Secondary | ICD-10-CM | POA: Insufficient documentation

## 2023-12-26 HISTORY — PX: PPM GENERATOR CHANGEOUT: EP1233

## 2023-12-26 LAB — PROTIME-INR
INR: 1.8 — ABNORMAL HIGH (ref 0.8–1.2)
Prothrombin Time: 21.5 s — ABNORMAL HIGH (ref 11.4–15.2)

## 2023-12-26 SURGERY — PPM GENERATOR CHANGEOUT
Anesthesia: LOCAL

## 2023-12-26 MED ORDER — SODIUM CHLORIDE 0.9% FLUSH: 3.0000 mL | INTRAVENOUS | Status: DC | PRN

## 2023-12-26 MED ORDER — SODIUM CHLORIDE 0.9 % IV SOLN: 250.0000 mL | INTRAVENOUS | Status: DC

## 2023-12-26 MED ORDER — SODIUM CHLORIDE 0.9 % IV SOLN
INTRAVENOUS | Status: DC
Start: 2023-12-26 — End: 2023-12-26
  Filled 2023-12-26: qty 2

## 2023-12-26 MED ORDER — WARFARIN SODIUM 2.5 MG PO TABS: ORAL_TABLET | ORAL | 1 refills | Status: DC

## 2023-12-26 MED ORDER — ONDANSETRON HCL 4 MG/2ML IJ SOLN: 4.0000 mg | Freq: Four times a day (QID) | INTRAMUSCULAR | Status: DC | PRN

## 2023-12-26 MED ORDER — LIDOCAINE HCL 1 % IJ SOLN
INTRAMUSCULAR | Status: AC
  Filled 2023-12-26: qty 20

## 2023-12-26 MED ORDER — ACETAMINOPHEN 325 MG PO TABS: 325.0000 mg | ORAL_TABLET | ORAL | Status: DC | PRN

## 2023-12-26 MED ORDER — CEFAZOLIN SODIUM-DEXTROSE 2-4 GM/100ML-% IV SOLN
INTRAVENOUS | Status: AC
  Filled 2023-12-26: qty 100

## 2023-12-26 MED ORDER — CEFAZOLIN SODIUM-DEXTROSE 2-4 GM/100ML-% IV SOLN
2.0000 g | INTRAVENOUS | Status: AC
  Administered 2023-12-26: 2 g via INTRAVENOUS

## 2023-12-26 MED ORDER — SODIUM CHLORIDE 0.9 % IV SOLN: INTRAVENOUS | Status: DC | PRN

## 2023-12-26 MED ORDER — LIDOCAINE HCL (PF) 1 % IJ SOLN
INTRAMUSCULAR | Status: DC | PRN
  Administered 2023-12-26: 60 mL

## 2023-12-26 MED ORDER — POVIDONE-IODINE 10 % EX SWAB: 2.0000 | Freq: Once | CUTANEOUS | Status: DC

## 2023-12-26 MED ORDER — LIDOCAINE HCL (PF) 1 % IJ SOLN
INTRAMUSCULAR | Status: AC
  Filled 2023-12-26: qty 30

## 2023-12-26 MED ORDER — CHLORHEXIDINE GLUCONATE 4 % EX SOLN
4.0000 | Freq: Once | CUTANEOUS | Status: DC
  Filled 2023-12-26: qty 60

## 2023-12-26 MED ORDER — SODIUM CHLORIDE 0.9% FLUSH: 3.0000 mL | Freq: Two times a day (BID) | INTRAVENOUS | Status: DC

## 2023-12-26 MED ORDER — SODIUM CHLORIDE 0.9 % IV SOLN
80.0000 mg | INTRAVENOUS | Status: AC
  Administered 2023-12-26: 80 mg

## 2023-12-26 SURGICAL SUPPLY — 7 items
CABLE SURGICAL S-101-97-12 (CABLE) ×1 IMPLANT
IPG PACE AZUR XT DR MRI W1DR01 (Pacemaker) IMPLANT
PACE AZURE XT DR MRI W1DR01 (Pacemaker) ×1 IMPLANT
PAD DEFIB RADIO PHYSIO CONN (PAD) ×1 IMPLANT
POUCH AIGIS-R ANTIBACT PPM (Mesh General) ×1 IMPLANT
POUCH AIGIS-R ANTIBACT PPM MED (Mesh General) IMPLANT
TRAY PACEMAKER INSERTION (PACKS) ×1 IMPLANT

## 2023-12-26 NOTE — Interval H&P Note (Signed)
 History and Physical Interval Note:  12/26/2023 2:09 PM  Alice Ballard  has presented today for surgery, with the diagnosis of ERI SSS.  The various methods of treatment have been discussed with the patient and family. After consideration of risks, benefits and other options for treatment, the patient has consented to  Procedure(s): PPM GENERATOR CHANGEOUT (N/A) as a surgical intervention.  The patient's history has been reviewed, patient examined, no change in status, stable for surgery.  I have reviewed the patient's chart and labs.  Questions were answered to the patient's satisfaction.     Talli Kimmer

## 2023-12-26 NOTE — Discharge Instructions (Addendum)

## 2023-12-27 ENCOUNTER — Encounter (HOSPITAL_COMMUNITY): Payer: Self-pay | Admitting: Cardiovascular Disease

## 2023-12-27 NOTE — Op Note (Signed)
 Procedure report  Procedure performed:  1.  Dual-chamber permanent pacemaker generator changeout  Reason for procedure:  1.  Device generator at elective replacement interval  2.  History of tachycardia-bradycardia syndrome 3.  Atrial fibrillation with slow ventricular response Procedure performed by:  Thurmon Fair, MD  Complications:  None  Estimated blood loss:  <5 mL  Medications administered during procedure:  Ancef 2 g intravenously,  lidocaine 1% 30 mL locally Device details:   New Generator Medtronic Azure XT DR MRI model number W1 DR 01, serial number [id number] Right atrial lead (chronic) Medtronic [id number], serial number PJN [id number] (implanted 05/05/2010) Right ventricular lead (chronic) Medtronic [id number], serial number [id number] (implanted 05/05/2010)  Explanted generator Medtronic Adapta,  model number ADD RL 1, serial number [id number] (implanted 05/05/2010)  Procedure details:  After the risks and benefits of the procedure were discussed the patient provided informed consent. She was brought to the cardiac catheter lab in the fasting state. The patient was prepped and draped in usual sterile fashion. Local anesthesia with 1% lidocaine was administered to to the left infraclavicular area. A 5-6cm horizontal incision was made parallel with and 2-3 cm caudal to the left clavicle, in the area of an old scar. Using minimal electrocautery and mostly sharp and blunt dissection the prepectoral pocket was opened carefully to avoid injury to the loops of chronic leads. Extensive dissection was not necessary. The device was explanted. The pocket was carefully inspected for hemostasis and flushed with copious amounts of antibiotic solution.  The leads were disconnected from the old generator and connected to the new generator, with appropriate pacing noted.  Lead parameters checked via telemetry showed stable normal findings.  The entire system was then carefully inserted in the  pocket with care been taking that the leads and device assumed a comfortable position without pressure on the incision. Great care was taken that the leads be located deep to the generator. The pocket was then closed in layers using 2 layers of 2-0 Vicryl and 3-0 Vicryl respectively after which cutaneous Steri-Strips and a sterile dressing was applied.   At the end of the procedure the following lead parameters were encountered:   Right atrial lead sensed P waves 1.3 mV, impedance 399 ohms, threshold could not be tested due to atrial arrhythmia.  Right ventricular lead sensed R waves 7.5 mV, impedance 418 ohms, threshold 1.0 at 0.4 ms pulse width.  Thurmon Fair, MD, [facility] [phone] office [phone] pager

## 2023-12-31 NOTE — Addendum Note (Signed)
 Addended by: Elease Etienne A on: 12/31/2023 10:13 AM   Modules accepted: Orders

## 2023-12-31 NOTE — Progress Notes (Signed)
 Remote pacemaker transmission.

## 2024-01-06 ENCOUNTER — Ambulatory Visit: Payer: Medicare Other | Attending: Cardiovascular Disease | Admitting: *Deleted

## 2024-01-06 DIAGNOSIS — I4891 Unspecified atrial fibrillation: Secondary | ICD-10-CM

## 2024-01-06 DIAGNOSIS — Z952 Presence of prosthetic heart valve: Secondary | ICD-10-CM

## 2024-01-06 DIAGNOSIS — Z5181 Encounter for therapeutic drug level monitoring: Secondary | ICD-10-CM

## 2024-01-06 LAB — POCT INR: INR: 2.7 (ref 2.0–3.0)

## 2024-01-06 NOTE — Patient Instructions (Signed)
 Take warfarin 1 tablet tomorrow then resume 2 tablets daily after procedure S/P generator change out on 12/26/23.   Recheck in 4 wks

## 2024-01-08 ENCOUNTER — Ambulatory Visit: Payer: Medicare Other | Attending: Cardiology

## 2024-01-08 DIAGNOSIS — I4821 Permanent atrial fibrillation: Secondary | ICD-10-CM

## 2024-01-08 NOTE — Progress Notes (Unsigned)
 Normal Pacemaker wound check. Wound well healed. Thresholds, sensing, and impedances consistent with implant measurements and chronic leads (gen change only). 84 AT/AF episodes, appear AT/Flutter, longest 17 secs, known AF hx, on warfarin. Patient denies any symptoms. Programmed VVIR.  AT/AF burden 1.7%. Reviewed arm restrictions to continue for 6 weeks total post op.  Pt enrolled in remote follow-up.

## 2024-01-08 NOTE — Patient Instructions (Signed)

## 2024-01-09 LAB — CUP PACEART INCLINIC DEVICE CHECK
Date Time Interrogation Session: 20250305202717
Implantable Lead Connection Status: 753985
Implantable Lead Connection Status: 753985
Implantable Lead Implant Date: 20110701
Implantable Lead Implant Date: 20110701
Implantable Lead Location: 753859
Implantable Lead Location: 753860
Implantable Lead Model: 5076
Implantable Lead Model: 5076
Implantable Pulse Generator Implant Date: 20250220

## 2024-02-03 ENCOUNTER — Ambulatory Visit: Attending: Cardiovascular Disease | Admitting: *Deleted

## 2024-02-03 DIAGNOSIS — Z5181 Encounter for therapeutic drug level monitoring: Secondary | ICD-10-CM

## 2024-02-03 DIAGNOSIS — I4891 Unspecified atrial fibrillation: Secondary | ICD-10-CM

## 2024-02-03 DIAGNOSIS — Z952 Presence of prosthetic heart valve: Secondary | ICD-10-CM

## 2024-02-03 LAB — POCT INR: INR: 2.4 (ref 2.0–3.0)

## 2024-02-03 NOTE — Patient Instructions (Signed)
 Continue warfarin 2 tablets daily after procedure S/P generator change out on 12/26/23.   Recheck in 4 wks

## 2024-02-07 ENCOUNTER — Ambulatory Visit (INDEPENDENT_AMBULATORY_CARE_PROVIDER_SITE_OTHER)

## 2024-02-07 DIAGNOSIS — I472 Ventricular tachycardia, unspecified: Secondary | ICD-10-CM

## 2024-02-10 LAB — CUP PACEART REMOTE DEVICE CHECK
Battery Remaining Longevity: 153 mo
Battery Remaining Longevity: 153 mo
Battery Voltage: 3.22 V
Battery Voltage: 3.22 V
Brady Statistic AP VP Percent: 0 %
Brady Statistic AP VP Percent: 0 %
Brady Statistic AP VS Percent: 0 %
Brady Statistic AP VS Percent: 0 %
Brady Statistic AS VP Percent: 99.13 %
Brady Statistic AS VP Percent: 99.13 %
Brady Statistic AS VS Percent: 0.87 %
Brady Statistic AS VS Percent: 0.87 %
Brady Statistic RA Percent Paced: 0 %
Brady Statistic RA Percent Paced: 0 %
Brady Statistic RV Percent Paced: 99.26 %
Brady Statistic RV Percent Paced: 99.26 %
Date Time Interrogation Session: 20250403145822
Date Time Interrogation Session: 20250403145822
Implantable Lead Connection Status: 753985
Implantable Lead Connection Status: 753985
Implantable Lead Connection Status: 753985
Implantable Lead Connection Status: 753985
Implantable Lead Implant Date: 20110701
Implantable Lead Implant Date: 20110701
Implantable Lead Implant Date: 20110701
Implantable Lead Implant Date: 20110701
Implantable Lead Location: 753859
Implantable Lead Location: 753860
Implantable Lead Location: 753859
Implantable Lead Location: 753860
Implantable Lead Model: 5076
Implantable Lead Model: 5076
Implantable Lead Model: 5076
Implantable Lead Model: 5076
Implantable Pulse Generator Implant Date: 20250220
Implantable Pulse Generator Implant Date: 20250220
Lead Channel Impedance Value: 304 Ohm
Lead Channel Impedance Value: 418 Ohm
Lead Channel Impedance Value: 418 Ohm
Lead Channel Impedance Value: 475 Ohm
Lead Channel Impedance Value: 304 Ohm
Lead Channel Impedance Value: 418 Ohm
Lead Channel Impedance Value: 418 Ohm
Lead Channel Impedance Value: 475 Ohm
Lead Channel Pacing Threshold Amplitude: 0.75 V
Lead Channel Pacing Threshold Amplitude: 0.75 V
Lead Channel Pacing Threshold Pulse Width: 0.4 ms
Lead Channel Pacing Threshold Pulse Width: 0.4 ms
Lead Channel Sensing Intrinsic Amplitude: 1.375 mV
Lead Channel Sensing Intrinsic Amplitude: 1.375 mV
Lead Channel Sensing Intrinsic Amplitude: 9.875 mV
Lead Channel Sensing Intrinsic Amplitude: 9.875 mV
Lead Channel Sensing Intrinsic Amplitude: 1.375 mV
Lead Channel Sensing Intrinsic Amplitude: 1.375 mV
Lead Channel Sensing Intrinsic Amplitude: 9.875 mV
Lead Channel Sensing Intrinsic Amplitude: 9.875 mV
Lead Channel Setting Pacing Amplitude: 2 V
Lead Channel Setting Pacing Amplitude: 2 V
Lead Channel Setting Pacing Pulse Width: 0.4 ms
Lead Channel Setting Pacing Pulse Width: 0.4 ms
Lead Channel Setting Sensing Sensitivity: 2.8 mV
Lead Channel Setting Sensing Sensitivity: 2.8 mV
Zone Setting Status: 755011
Zone Setting Status: 755011
Zone Setting Status: 755011
Zone Setting Status: 755011

## 2024-03-02 ENCOUNTER — Ambulatory Visit: Attending: Cardiovascular Disease | Admitting: *Deleted

## 2024-03-02 DIAGNOSIS — Z952 Presence of prosthetic heart valve: Secondary | ICD-10-CM | POA: Diagnosis not present

## 2024-03-02 DIAGNOSIS — I4891 Unspecified atrial fibrillation: Secondary | ICD-10-CM | POA: Diagnosis not present

## 2024-03-02 DIAGNOSIS — Z5181 Encounter for therapeutic drug level monitoring: Secondary | ICD-10-CM

## 2024-03-02 LAB — POCT INR: INR: 2.1 (ref 2.0–3.0)

## 2024-03-02 NOTE — Patient Instructions (Signed)
Continue warfarin 2 tablets daily.  Recheck in 6 wks

## 2024-03-16 NOTE — Addendum Note (Signed)
 Addended by: Lott Rouleau A on: 03/16/2024 10:09 AM   Modules accepted: Orders

## 2024-03-16 NOTE — Progress Notes (Signed)
 Remote pacemaker transmission.

## 2024-03-24 DIAGNOSIS — R945 Abnormal results of liver function studies: Secondary | ICD-10-CM | POA: Diagnosis not present

## 2024-03-24 DIAGNOSIS — D559 Anemia due to enzyme disorder, unspecified: Secondary | ICD-10-CM | POA: Diagnosis not present

## 2024-03-24 DIAGNOSIS — E7849 Other hyperlipidemia: Secondary | ICD-10-CM | POA: Diagnosis not present

## 2024-03-24 DIAGNOSIS — D519 Vitamin B12 deficiency anemia, unspecified: Secondary | ICD-10-CM | POA: Diagnosis not present

## 2024-03-24 DIAGNOSIS — Z131 Encounter for screening for diabetes mellitus: Secondary | ICD-10-CM | POA: Diagnosis not present

## 2024-03-24 DIAGNOSIS — E039 Hypothyroidism, unspecified: Secondary | ICD-10-CM | POA: Diagnosis not present

## 2024-03-24 DIAGNOSIS — E559 Vitamin D deficiency, unspecified: Secondary | ICD-10-CM | POA: Diagnosis not present

## 2024-03-31 DIAGNOSIS — Z1389 Encounter for screening for other disorder: Secondary | ICD-10-CM | POA: Diagnosis not present

## 2024-03-31 DIAGNOSIS — R001 Bradycardia, unspecified: Secondary | ICD-10-CM | POA: Diagnosis not present

## 2024-03-31 DIAGNOSIS — Z950 Presence of cardiac pacemaker: Secondary | ICD-10-CM | POA: Diagnosis not present

## 2024-03-31 DIAGNOSIS — Z9189 Other specified personal risk factors, not elsewhere classified: Secondary | ICD-10-CM | POA: Diagnosis not present

## 2024-03-31 DIAGNOSIS — R296 Repeated falls: Secondary | ICD-10-CM | POA: Diagnosis not present

## 2024-03-31 DIAGNOSIS — Z0001 Encounter for general adult medical examination with abnormal findings: Secondary | ICD-10-CM | POA: Diagnosis not present

## 2024-03-31 DIAGNOSIS — Z6822 Body mass index (BMI) 22.0-22.9, adult: Secondary | ICD-10-CM | POA: Diagnosis not present

## 2024-03-31 DIAGNOSIS — I10 Essential (primary) hypertension: Secondary | ICD-10-CM | POA: Diagnosis not present

## 2024-04-01 ENCOUNTER — Ambulatory Visit: Payer: Medicare Other | Attending: Cardiovascular Disease | Admitting: Cardiovascular Disease

## 2024-04-01 VITALS — BP 110/52 | HR 83 | Ht 61.0 in | Wt 119.0 lb

## 2024-04-01 DIAGNOSIS — D6869 Other thrombophilia: Secondary | ICD-10-CM | POA: Diagnosis not present

## 2024-04-01 DIAGNOSIS — I10 Essential (primary) hypertension: Secondary | ICD-10-CM | POA: Diagnosis not present

## 2024-04-01 DIAGNOSIS — Z950 Presence of cardiac pacemaker: Secondary | ICD-10-CM | POA: Diagnosis not present

## 2024-04-01 DIAGNOSIS — I472 Ventricular tachycardia, unspecified: Secondary | ICD-10-CM

## 2024-04-01 DIAGNOSIS — I4821 Permanent atrial fibrillation: Secondary | ICD-10-CM

## 2024-04-01 DIAGNOSIS — R296 Repeated falls: Secondary | ICD-10-CM | POA: Diagnosis not present

## 2024-04-01 DIAGNOSIS — Z952 Presence of prosthetic heart valve: Secondary | ICD-10-CM

## 2024-04-01 NOTE — Patient Instructions (Signed)
 Medication Instructions:  Your physician recommends that you continue on your current medications as directed. Please refer to the Current Medication list given to you today.  *If you need a refill on your cardiac medications before your next appointment, please call your pharmacy*  Lab Work: NONE ORDERED TODAY If you have labs (blood work) drawn today and your tests are completely normal, you will receive your results only by: MyChart Message (if you have MyChart) OR A paper copy in the mail If you have any lab test that is abnormal or we need to change your treatment, we will call you to review the results.  Testing/Procedures: NONE ORDERED TODAY  Follow-Up: At [facility], you and your health needs are our priority.  As part of our continuing mission to provide you with exceptional heart care, our providers are all part of one team.  This team includes your primary Cardiologist (physician) and Advanced Practice Providers or APPs (Physician Assistants and Nurse Practitioners) who all work together to provide you with the care you need, when you need it.  Your next appointment:   1 year(s)  Provider:   Luana Rumple, MD  We recommend signing up for the patient portal called "MyChart".  Sign up information is provided on this After Visit Summary.  MyChart is used to connect with patients for Virtual Visits (Telemedicine).  Patients are able to view lab/test results, encounter notes, upcoming appointments, etc.  Non-urgent messages can be sent to your provider as well.   To learn more about what you can do with MyChart, go to [email or web address].

## 2024-04-01 NOTE — Progress Notes (Signed)
 Patient ID: Alice Ballard, female   DOB: 1941-07-20, 83 y.o.   MRN: [id number]    Cardiology Office Note    Date:  04/08/2024   ID:  Alice Ballard, DOB Apr 19, 1941, MRN [id number]  PCP:  Alston Jerry, MD  Cardiologist:   Luana Rumple, MD   Chief Complaint  Patient presents with   Cardiac Valve Problem   Atrial Fibrillation   Pacemaker Check    History of Present Illness:  Alice Ballard is a 83 y.o. 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  Her current pacemaker was a generator change performed in February 2025 (Medtronic Azure XT DR, 5076 leads, MRI conditional system)  She is not having any major cardiovascular problems, but is generally becoming a little more frail.  She had a fall on the porch (not preceded by dizziness or syncope).  She complains of back pain radiating across both shoulders in a pattern that suggests possible radiculopathy.  She is using her walker consistently.  Her weight has remained steady, with a BMI just under 23.  She occasionally has mild ankle swelling towards the end of the day, but it always resolves by the next morning.  She has not had any serious bleeding problems and her INR is consistently in the therapeutic range, with a single exception when it was down to 1.8 in February.  She has not had any problems with shortness of breath, anterior chest pain, exertional chest discomfort, dizziness, palpitations or syncope.  Pacemaker interrogation shows normal device function.  She has atrial fibrillation with slow ventricular response and is not entirely device dependent, but her spontaneous ventricular rate is in the 30-40 bpm range.  She has 99.3% ventricular pacing.  There have been no episodes of  high ventricular rate. P lead parameters are all within normal range and stable.  Battery is at beginning of life.  The echocardiogram performed 06/26/2022 shows similar mildly decreased left ventricular ejection fraction of 40-45% (last year 45-50%) and stable gradients across both the aortic and mitral valve prostheses (she has always had a mildly increased aortic valve mean gradient at about 15-20 mmHg, likely a degree of prosthesis mismatch).  She is due to have another echocardiogram this year.  Showing mild signs of early cognitive dysfunction.  Alice Ballard is a 83 year old woman with a mechanical aortic and mitral valve prostheses as well as long-term persistent atrial fibrillation, hypertension, dyslipidemia. In 1986 her mitral valve was replaced with a 29 mm St. Jude prosthesis for rheumatic disease. In 1994 her aortic valve was replaced with a 19 mm St. Jude device. Has chronic moderate elevation in the valvular gradients across the aortic valve, likely an expression of patient-valve mismatch. Fluoroscopy has shown normal disc motion. Previous right heart catheterization had shown only very mild pulmonary hypertension (mean PA pressure 25 mm Hg, wedge pressure 16 mm Hg) She has no history of coronary artery disease. She has a dual-chamber permanent pacemaker (Medtronic) that was implanted in 2011 for tachycardia-bradycardia syndrome, now programmed VVIR, virtually 100% V paced. She is on chronic warfarin anticoagulation both for her mechanical valves and for atrial fibrillation. She has not had serious bleeding complications or embolic events. She does not have a history of stroke.  Past Medical History:  Diagnosis Date   Abnormality of gait 07/04/2016  Cerebral vascular disease    Coronary artery disease    Excessive daytime sleepiness 07/04/2016   Fatty liver    Gait abnormality    Gastric polyp    GERD (gastroesophageal reflux disease)    Hemorrhoids    Hiatal hernia    Hyperlipidemia     Hypersomnolence    Hypertension    Pacemaker july 2011   medtronic adapta model # ADDRL 1,SERIAL #[id number]   Paroxysmal atrial fibrillation (HCC)    Seizure disorder [facility])    Sick sinus syndrome (HCC)    Spinal stenosis    Tubular adenoma of colon 11/2010   UTI (urinary tract infection)     Past Surgical History:  Procedure Laterality Date   AORTIC AND MITRAL VALVE REPLACEMENT  1994   19mm St. Jude   BACK SURGERY     CARDIAC CATHETERIZATION  04/2010   DOPPLER ECHOCARDIOGRAPHY  sept 06, 2013   aotric valve grad. peak 51 mmHg and mean 28 mm Hg  dimensional subtractive index 0.26; mitral valve grad peak 25 and mean 7mm Hg norm pressure halftime .   MITRAL VALVE REPLACEMENT  1986   St Jude prosthesis    NM MYOCAR PERF WALL MOTION  03/31/2008   EF 69% low risk   NM MYOCAR PERF WALL MOTION  04/19/2006   EF 69%   PACEMAKER PLACEMENT  05/2010   PPM GENERATOR CHANGEOUT N/A 12/26/2023   Procedure: PPM GENERATOR CHANGEOUT;  Surgeon: Luana Rumple, MD;  Location: [facility] INVASIVE CV LAB;  Service: Cardiovascular;  Laterality: N/A;   TEE WITHOUT CARDIOVERSION  07/11/2012   Procedure: TRANSESOPHAGEAL ECHOCARDIOGRAM (TEE);  Surgeon: Luana Rumple, MD;  Location: [facility] ENDOSCOPY;  Service: Cardiovascular;  Laterality: N/A;    Outpatient Medications Prior to Visit  Medication Sig Dispense Refill   acetaminophen  (TYLENOL ) 500 MG tablet Take 1,000 mg by mouth every 6 (six) hours as needed. For pain     alendronate (FOSAMAX) 70 MG tablet Take 70 mg by mouth every Sunday. Take with a full glass of water on an empty stomach. Takes on Sunday.     Ascorbic Acid (VITAMIN C) 1000 MG tablet Take 1,000 mg by mouth daily.     atorvastatin  (LIPITOR) 40 MG tablet TAKE 1 TABLET BY MOUTH DAILY (Patient taking differently: Take 40 mg by mouth at bedtime.) 90 tablet 3   Cholecalciferol (VITAMIN D3) 2000 UNITS capsule Take 2,000 Units by mouth daily.     dorzolamide (TRUSOPT) 2 % ophthalmic solution Place 1 drop  into the right eye 2 (two) times daily.     guaifenesin (ROBITUSSIN) 100 MG/5ML syrup Take 200 mg by mouth 3 (three) times daily as needed for cough.     latanoprost (XALATAN) 0.005 % ophthalmic solution Place 1 drop into both eyes at bedtime.     levETIRAcetam  (KEPPRA ) 500 MG tablet Take 500 mg by mouth 2 (two) times daily.     lisinopril  (ZESTRIL ) 30 MG tablet TAKE 1 TABLET BY MOUTH EVERY DAY 90 tablet 3   vitamin B-12 (CYANOCOBALAMIN) 1000 MCG tablet Take 1,000 mcg by mouth daily.     warfarin (COUMADIN ) 2.5 MG tablet Take one tablet today 02/20 and tomorrow 02/21, then take 2 tablets daily until next PT/INR check 210 tablet 1   No facility-administered medications prior to visit.     Allergies:   Shellfish allergy and Tegretol  [carbamazepine ]   Family History:  The patient's family history includes Colon cancer in her sister; Colon polyps in her sister; Heart disease in  her mother.   ROS:   Please see the history of present illness.    ROS All other systems are reviewed and are negative.   PHYSICAL EXAM:   VS:  BP (!) 110/52 (BP Location: Left Arm, Patient Position: Sitting, Cuff Size: Normal)   Pulse 83   Ht 5\' 1"  (1.549 m)   Wt 54 kg   SpO2 98%   BMI 22.48 kg/m       General: Alert, oriented x3, no distress, appears elderly and frail.  Healthy pacemaker site. Head: no evidence of trauma, PERRL, EOMI, no exophtalmos or lid lag, no myxedema, no xanthelasma; normal ears, nose and oropharynx Neck: normal jugular venous pulsations and no hepatojugular reflux; brisk carotid pulses without delay and no carotid bruits Chest: clear to auscultation, no signs of consolidation by percussion or palpation, normal fremitus, symmetrical and full respiratory excursions Cardiovascular: normal position and quality of the apical impulse, regular rhythm, crisp prosthetic valve clicks, no systolic murmurs, rubs or gallops.  2/6 mid peaking aortic ejection murmur. Abdomen: no tenderness or  distention, no masses by palpation, no abnormal pulsatility or arterial bruits, normal bowel sounds, no hepatosplenomegaly Extremities: no clubbing, cyanosis or edema; 2+ radial, ulnar and brachial pulses bilaterally; 2+ right femoral, posterior tibial and dorsalis pedis pulses; 2+ left femoral, posterior tibial and dorsalis pedis pulses; no subclavian or femoral bruits Neurological: grossly nonfocal Psych: Normal mood and affect    Wt Readings from Last 3 Encounters:  04/01/24 54 kg  12/26/23 54.4 kg  12/23/23 54.7 kg      Studies/Labs Reviewed:   Echocardiogram 06/26/2022    1. Left ventricular ejection fraction, by estimation, is 40 to 45%. The  left ventricle has mildly decreased function. The left ventricle  demonstrates global hypokinesis. Left ventricular diastolic function could  not be evaluated. Elevated left  ventricular end-diastolic pressure.   2. Right ventricular systolic function is mildly reduced. The right  ventricular size is normal. There is mildly elevated pulmonary artery  systolic pressure. The estimated right ventricular systolic pressure is  40.0 mmHg.   3. Left atrial size was moderately dilated.   4. Right atrial size was severely dilated.   5. The mitral valve has been repaired/replaced. There is a St. Jude  mechanical valve present in the mitral position.      Mitral regurgitation cannot be assessed due to LA shadowing from  mechanical MVR. No evidence of mitral stenosis. The mean mitral valve  gradient is 3.0 mmHg.   6. Tricuspid valve regurgitation is moderate.   7. The aortic valve has been repaired/replaced. Aortic valve  regurgitation is mild. No aortic stenosis is present. There is a St. Jude  mechanical valve present in the aortic position. Aortic valve mean  gradient measures 19.5 mmHg. Aortic valve Vmax  measures 3.03 m/s.   8. Aortic dilatation noted. There is mild dilatation of the ascending  aorta, measuring 41 mm. There is mild  dilatation of the aortic root,  measuring 39 mm.   9. The inferior vena cava is dilated in size with <50% respiratory  variability, suggesting right atrial pressure of 15 mmHg.   Comparison(s): 06/29/20 EF 45-50%. MV mean PG, peak PG. AV  mean PG, peak PG.   EKG:  EKG from 12/23/2023 is personally reviewed and shows ventricular paced rhythm with background atrial fibrillation.   EKG Interpretation Date/Time:    Ventricular Rate:    PR Interval:    QRS Duration:  QT Interval:    QTC Calculation:   R Axis:      Text Interpretation:          Recent Labs: December 29, 2020 Cholesterol 136, HDL 57, LDL 62, triglycerides 87  Mar 29, 2021 Hemoglobin 12.0, creatinine 0.78, ALT 14, TSH 3.19  Mar 28, 2022 Cholesterol 151, HDL 73, LDL 62, triglycerides 86 Hemoglobin 12.5, creatinine 0.8, potassium 4.2, ALT 20 TSH 1.78 on 12/28/2021  03/26/2023 Cholesterol 151, HDL 69, LDL 62, triglycerides 74 Creatinine 1.03, potassium 4.5, TSH 0.9275  03/23/2024 Cholesterol 135, HDL 60, LDL 58, triglycerides 84 Creatinine 0.8, potassium 4.6, normal liver function tests and TSH Hemoglobin 11.3 Hemoglobin A1c 5.4%  ASSESSMENT:    1. Permanent atrial fibrillation (HCC)   2. Pacemaker   3. Ventricular tachycardia (HCC)   4. Hx of mechanical aortic valve replacement   5. History of mitral valve replacement, mechanical   6. Acquired thrombophilia (HCC)   7. Essential hypertension   8. Falls frequently       PLAN:  In order of problems listed above:  Permanent atrial fibrillation: Slow ventricular response.  Even when we programmed her device to 50 bpm she had virtually 100% ventricular pacing.  Device is now programmed with a lower rate limit of 70 bpm. PPM: Good heart rate histogram distribution with current sensor settings. NSVT: None recorded since the pacemaker generator change out a few months ago.  Before she would usually have brief bursts of  nonsustained VT 2 or 3 times a month. Mechanical AVR: for years she has had a moderate degree of stenosis/patient prosthesis mismatch which has been asymptomatic.  The gradients may be exaggerated by the small prosthesis and small aortic root size (likely has some degree of pressure recovery).  She is by no means a candidate for redo surgery.  She is aware of the need for endocarditis prophylaxis.  Repeat her echocardiogram. Mechanical MVR: Normal prosthetic valve function.  Check echo this year. Warfarin: Almost 100% therapeutic range.  No bleeding complications. HTN: Well-controlled.  Diastolic blood pressure is actually a little low.  No changes to medication at this time, but consider reducing the lisinopril  dose. Weight loss: At optimal weight.  Weight loss has stabilized. Falls: She is only had 1 fall in the last year.  Advised her to continue using her walker and be very cautious.  I wonder if her shoulder pain is related to a thoracic vertebral compression fracture.   Medication Adjustments/Labs and Tests Ordered: Current medicines are reviewed at length with the patient today.  Concerns regarding medicines are outlined above.  Medication changes, Labs and Tests ordered today are listed in the Patient Instructions below. Patient Instructions  Medication Instructions:  Your physician recommends that you continue on your current medications as directed. Please refer to the Current Medication list given to you today.  *If you need a refill on your cardiac medications before your next appointment, please call your pharmacy*  Lab Work: NONE ORDERED TODAY  If you have labs (blood work) drawn today and your tests are completely normal, you will receive your results only by: MyChart Message (if you have MyChart) OR A paper copy in the mail If you have any lab test that is abnormal or we need to change your treatment, we will call you to review the results.  Testing/Procedures: NONE ORDERED  TODAY   Follow-Up: At [facility], you and your health needs are our priority.  As part of our continuing mission to  provide you with exceptional heart care, our providers are all part of one team.  This team includes your primary Cardiologist (physician) and Advanced Practice Providers or APPs (Physician Assistants and Nurse Practitioners) who all work together to provide you with the care you need, when you need it.  Your next appointment:   1 year(s)  Provider:   Luana Rumple, MD  We recommend signing up for the patient portal called "MyChart".  Sign up information is provided on this After Visit Summary.  MyChart is used to connect with patients for Virtual Visits (Telemedicine).  Patients are able to view lab/test results, encounter notes, upcoming appointments, etc.  Non-urgent messages can be sent to your provider as well.   To learn more about what you can do with MyChart, go to [email or web address].            Signed, Luana Rumple, MD  04/08/2024 9:22 AM    [facility] [address] [place], [place], [place]  [address] Phone: [phone]; Fax: [phone]

## 2024-04-08 ENCOUNTER — Encounter: Payer: Self-pay | Admitting: Cardiovascular Disease

## 2024-04-13 DIAGNOSIS — H401132 Primary open-angle glaucoma, bilateral, moderate stage: Secondary | ICD-10-CM | POA: Diagnosis not present

## 2024-04-13 DIAGNOSIS — I10 Essential (primary) hypertension: Secondary | ICD-10-CM | POA: Diagnosis not present

## 2024-04-14 ENCOUNTER — Ambulatory Visit: Attending: Cardiovascular Disease | Admitting: *Deleted

## 2024-04-14 DIAGNOSIS — Z952 Presence of prosthetic heart valve: Secondary | ICD-10-CM | POA: Diagnosis not present

## 2024-04-14 DIAGNOSIS — I4891 Unspecified atrial fibrillation: Secondary | ICD-10-CM | POA: Diagnosis not present

## 2024-04-14 DIAGNOSIS — Z5181 Encounter for therapeutic drug level monitoring: Secondary | ICD-10-CM

## 2024-04-14 LAB — POCT INR: INR: 2.2 (ref 2.0–3.0)

## 2024-04-14 NOTE — Patient Instructions (Signed)
Continue warfarin 2 tablets daily.  Recheck in 6 wks

## 2024-05-04 DIAGNOSIS — I10 Essential (primary) hypertension: Secondary | ICD-10-CM | POA: Diagnosis not present

## 2024-05-04 DIAGNOSIS — E782 Mixed hyperlipidemia: Secondary | ICD-10-CM | POA: Diagnosis not present

## 2024-05-04 DIAGNOSIS — E559 Vitamin D deficiency, unspecified: Secondary | ICD-10-CM | POA: Diagnosis not present

## 2024-05-04 DIAGNOSIS — I4821 Permanent atrial fibrillation: Secondary | ICD-10-CM | POA: Diagnosis not present

## 2024-05-04 DIAGNOSIS — E440 Moderate protein-calorie malnutrition: Secondary | ICD-10-CM | POA: Diagnosis not present

## 2024-05-07 ENCOUNTER — Ambulatory Visit (INDEPENDENT_AMBULATORY_CARE_PROVIDER_SITE_OTHER)

## 2024-05-07 DIAGNOSIS — I4891 Unspecified atrial fibrillation: Secondary | ICD-10-CM

## 2024-05-07 LAB — CUP PACEART REMOTE DEVICE CHECK
Battery Remaining Longevity: 147 mo
Battery Voltage: 3.19 V
Brady Statistic AP VP Percent: 0 %
Brady Statistic AP VS Percent: 0 %
Brady Statistic AS VP Percent: 98.99 %
Brady Statistic AS VS Percent: 1.01 %
Brady Statistic RA Percent Paced: 0 %
Brady Statistic RV Percent Paced: 99.15 %
Date Time Interrogation Session: 20250703010032
Implantable Lead Connection Status: 753985
Implantable Lead Connection Status: 753985
Implantable Lead Implant Date: 20110701
Implantable Lead Implant Date: 20110701
Implantable Lead Location: 753859
Implantable Lead Location: 753860
Implantable Lead Model: 5076
Implantable Lead Model: 5076
Implantable Pulse Generator Implant Date: 20250220
Lead Channel Impedance Value: 304 Ohm
Lead Channel Impedance Value: 380 Ohm
Lead Channel Impedance Value: 418 Ohm
Lead Channel Impedance Value: 418 Ohm
Lead Channel Pacing Threshold Amplitude: 0.75 V
Lead Channel Pacing Threshold Pulse Width: 0.4 ms
Lead Channel Sensing Intrinsic Amplitude: 1.625 mV
Lead Channel Sensing Intrinsic Amplitude: 1.625 mV
Lead Channel Sensing Intrinsic Amplitude: 11.625 mV
Lead Channel Sensing Intrinsic Amplitude: 11.625 mV
Lead Channel Setting Pacing Amplitude: 2 V
Lead Channel Setting Pacing Pulse Width: 0.4 ms
Lead Channel Setting Sensing Sensitivity: 2.8 mV
Zone Setting Status: 755011
Zone Setting Status: 755011

## 2024-05-12 ENCOUNTER — Ambulatory Visit: Payer: Self-pay | Admitting: Cardiovascular Disease

## 2024-05-26 ENCOUNTER — Ambulatory Visit: Attending: Cardiovascular Disease | Admitting: *Deleted

## 2024-05-26 DIAGNOSIS — Z5181 Encounter for therapeutic drug level monitoring: Secondary | ICD-10-CM

## 2024-05-26 DIAGNOSIS — Z952 Presence of prosthetic heart valve: Secondary | ICD-10-CM | POA: Diagnosis not present

## 2024-05-26 DIAGNOSIS — I4891 Unspecified atrial fibrillation: Secondary | ICD-10-CM | POA: Diagnosis not present

## 2024-05-26 LAB — POCT INR: INR: 2.3 (ref 2.0–3.0)

## 2024-05-26 NOTE — Patient Instructions (Signed)
Continue warfarin 2 tablets daily.  Recheck in 6 wks

## 2024-05-26 NOTE — Progress Notes (Signed)
Please see anticoagulation encounter.

## 2024-06-04 DIAGNOSIS — E559 Vitamin D deficiency, unspecified: Secondary | ICD-10-CM | POA: Diagnosis not present

## 2024-06-04 DIAGNOSIS — E782 Mixed hyperlipidemia: Secondary | ICD-10-CM | POA: Diagnosis not present

## 2024-06-04 DIAGNOSIS — E440 Moderate protein-calorie malnutrition: Secondary | ICD-10-CM | POA: Diagnosis not present

## 2024-06-04 DIAGNOSIS — I4821 Permanent atrial fibrillation: Secondary | ICD-10-CM | POA: Diagnosis not present

## 2024-06-04 DIAGNOSIS — I10 Essential (primary) hypertension: Secondary | ICD-10-CM | POA: Diagnosis not present

## 2024-06-22 ENCOUNTER — Ambulatory Visit: Payer: Medicare Other | Attending: Cardiovascular Disease

## 2024-06-22 DIAGNOSIS — Z952 Presence of prosthetic heart valve: Secondary | ICD-10-CM | POA: Diagnosis not present

## 2024-06-23 LAB — ECHOCARDIOGRAM COMPLETE
AV Mean grad: 23 mmHg
AV Peak grad: 44.1 mmHg
Ao pk vel: 3.32 m/s
Calc EF: 43 %
S' Lateral: 2.5 cm
Single Plane A2C EF: 39.1 %
Single Plane A4C EF: 50.4 %

## 2024-06-24 ENCOUNTER — Ambulatory Visit: Payer: Self-pay | Admitting: Cardiovascular Disease

## 2024-06-25 DIAGNOSIS — E039 Hypothyroidism, unspecified: Secondary | ICD-10-CM | POA: Diagnosis not present

## 2024-06-25 DIAGNOSIS — Z0001 Encounter for general adult medical examination with abnormal findings: Secondary | ICD-10-CM | POA: Diagnosis not present

## 2024-06-25 DIAGNOSIS — I10 Essential (primary) hypertension: Secondary | ICD-10-CM | POA: Diagnosis not present

## 2024-06-25 DIAGNOSIS — R945 Abnormal results of liver function studies: Secondary | ICD-10-CM | POA: Diagnosis not present

## 2024-06-25 DIAGNOSIS — D559 Anemia due to enzyme disorder, unspecified: Secondary | ICD-10-CM | POA: Diagnosis not present

## 2024-06-29 NOTE — Telephone Encounter (Signed)
 Croitoru, Mihai, MD to Me (Selected Message)    06/24/24  6:34 PM Result Note As before, the aortic valve is a little small, but there is no significant change from last year. Heart pumping strength and the mitral valve look great.  Left the information above over voicemail (dpr) left call back number.

## 2024-07-02 DIAGNOSIS — E440 Moderate protein-calorie malnutrition: Secondary | ICD-10-CM | POA: Diagnosis not present

## 2024-07-02 DIAGNOSIS — E782 Mixed hyperlipidemia: Secondary | ICD-10-CM | POA: Diagnosis not present

## 2024-07-02 DIAGNOSIS — I10 Essential (primary) hypertension: Secondary | ICD-10-CM | POA: Diagnosis not present

## 2024-07-02 DIAGNOSIS — I4821 Permanent atrial fibrillation: Secondary | ICD-10-CM | POA: Diagnosis not present

## 2024-07-02 DIAGNOSIS — Z6821 Body mass index (BMI) 21.0-21.9, adult: Secondary | ICD-10-CM | POA: Diagnosis not present

## 2024-07-03 DIAGNOSIS — E440 Moderate protein-calorie malnutrition: Secondary | ICD-10-CM | POA: Diagnosis not present

## 2024-07-03 DIAGNOSIS — E782 Mixed hyperlipidemia: Secondary | ICD-10-CM | POA: Diagnosis not present

## 2024-07-03 DIAGNOSIS — I4821 Permanent atrial fibrillation: Secondary | ICD-10-CM | POA: Diagnosis not present

## 2024-07-03 DIAGNOSIS — E559 Vitamin D deficiency, unspecified: Secondary | ICD-10-CM | POA: Diagnosis not present

## 2024-07-03 DIAGNOSIS — I10 Essential (primary) hypertension: Secondary | ICD-10-CM | POA: Diagnosis not present

## 2024-07-06 ENCOUNTER — Other Ambulatory Visit: Payer: Self-pay | Admitting: Cardiovascular Disease

## 2024-07-07 ENCOUNTER — Encounter

## 2024-07-13 ENCOUNTER — Ambulatory Visit: Attending: Cardiovascular Disease | Admitting: *Deleted

## 2024-07-13 DIAGNOSIS — I4891 Unspecified atrial fibrillation: Secondary | ICD-10-CM | POA: Diagnosis not present

## 2024-07-13 DIAGNOSIS — Z952 Presence of prosthetic heart valve: Secondary | ICD-10-CM | POA: Diagnosis not present

## 2024-07-13 DIAGNOSIS — Z5181 Encounter for therapeutic drug level monitoring: Secondary | ICD-10-CM

## 2024-07-13 LAB — POCT INR: INR: 2.5 (ref 2.0–3.0)

## 2024-07-13 NOTE — Patient Instructions (Signed)
Continue warfarin 2 tablets daily.  Recheck in 6 wks

## 2024-07-14 NOTE — Progress Notes (Signed)
 INR 2.5. Please see anticoagulation encounter

## 2024-08-04 DIAGNOSIS — I10 Essential (primary) hypertension: Secondary | ICD-10-CM | POA: Diagnosis not present

## 2024-08-04 DIAGNOSIS — E559 Vitamin D deficiency, unspecified: Secondary | ICD-10-CM | POA: Diagnosis not present

## 2024-08-04 DIAGNOSIS — I4821 Permanent atrial fibrillation: Secondary | ICD-10-CM | POA: Diagnosis not present

## 2024-08-04 DIAGNOSIS — E782 Mixed hyperlipidemia: Secondary | ICD-10-CM | POA: Diagnosis not present

## 2024-08-04 DIAGNOSIS — E440 Moderate protein-calorie malnutrition: Secondary | ICD-10-CM | POA: Diagnosis not present

## 2024-08-06 ENCOUNTER — Ambulatory Visit

## 2024-08-06 DIAGNOSIS — I4891 Unspecified atrial fibrillation: Secondary | ICD-10-CM

## 2024-08-07 LAB — CUP PACEART REMOTE DEVICE CHECK
Battery Remaining Longevity: 144 mo
Battery Voltage: 3.15 V
Brady Statistic AP VP Percent: 0 %
Brady Statistic AP VS Percent: 0 %
Brady Statistic AS VP Percent: 99.13 %
Brady Statistic AS VS Percent: 0.87 %
Brady Statistic RA Percent Paced: 0 %
Brady Statistic RV Percent Paced: 99.15 %
Date Time Interrogation Session: 20251001211913
Implantable Lead Connection Status: 753985
Implantable Lead Connection Status: 753985
Implantable Lead Implant Date: 20110701
Implantable Lead Implant Date: 20110701
Implantable Lead Location: 753859
Implantable Lead Location: 753860
Implantable Lead Model: 5076
Implantable Lead Model: 5076
Implantable Pulse Generator Implant Date: 20250220
Lead Channel Impedance Value: 304 Ohm
Lead Channel Impedance Value: 380 Ohm
Lead Channel Impedance Value: 399 Ohm
Lead Channel Impedance Value: 418 Ohm
Lead Channel Pacing Threshold Amplitude: 0.75 V
Lead Channel Pacing Threshold Pulse Width: 0.4 ms
Lead Channel Sensing Intrinsic Amplitude: 1.375 mV
Lead Channel Sensing Intrinsic Amplitude: 1.375 mV
Lead Channel Sensing Intrinsic Amplitude: 11.375 mV
Lead Channel Sensing Intrinsic Amplitude: 11.375 mV
Lead Channel Setting Pacing Amplitude: 2 V
Lead Channel Setting Pacing Pulse Width: 0.4 ms
Lead Channel Setting Sensing Sensitivity: 2.8 mV
Zone Setting Status: 755011
Zone Setting Status: 755011

## 2024-08-10 NOTE — Progress Notes (Signed)
 Remote PPM Transmission

## 2024-08-13 NOTE — Progress Notes (Signed)
 Remote PPM Transmission

## 2024-08-24 ENCOUNTER — Ambulatory Visit: Payer: Self-pay | Admitting: Cardiovascular Disease

## 2024-08-24 ENCOUNTER — Ambulatory Visit: Attending: Cardiovascular Disease | Admitting: *Deleted

## 2024-08-24 DIAGNOSIS — Z5181 Encounter for therapeutic drug level monitoring: Secondary | ICD-10-CM

## 2024-08-24 DIAGNOSIS — Z952 Presence of prosthetic heart valve: Secondary | ICD-10-CM

## 2024-08-24 DIAGNOSIS — I4891 Unspecified atrial fibrillation: Secondary | ICD-10-CM

## 2024-08-24 LAB — POCT INR: INR: 2.5 (ref 2.0–3.0)

## 2024-08-24 NOTE — Progress Notes (Signed)
 INR 2.5. Please see anticoagulation encounter

## 2024-08-24 NOTE — Patient Instructions (Signed)
Continue warfarin 2 tablets daily.  Recheck in 6 wks

## 2024-10-05 ENCOUNTER — Ambulatory Visit: Attending: Cardiovascular Disease | Admitting: *Deleted

## 2024-10-05 DIAGNOSIS — Z952 Presence of prosthetic heart valve: Secondary | ICD-10-CM | POA: Diagnosis not present

## 2024-10-05 DIAGNOSIS — I4891 Unspecified atrial fibrillation: Secondary | ICD-10-CM

## 2024-10-05 DIAGNOSIS — Z5181 Encounter for therapeutic drug level monitoring: Secondary | ICD-10-CM

## 2024-10-05 LAB — POCT INR: INR: 2.2 (ref 2.0–3.0)

## 2024-10-05 NOTE — Patient Instructions (Signed)
Continue warfarin 2 tablets daily.  Recheck in 6 wks

## 2024-10-05 NOTE — Progress Notes (Signed)
 INR 2.2; Please see anticoagulation encounter

## 2024-10-13 ENCOUNTER — Other Ambulatory Visit: Payer: Self-pay | Admitting: Cardiovascular Disease

## 2024-10-13 DIAGNOSIS — I4891 Unspecified atrial fibrillation: Secondary | ICD-10-CM

## 2024-10-13 NOTE — Telephone Encounter (Signed)
 Warfarin 2.5mg  refill Afib Last INR 10/05/24 Last OV 04/01/24

## 2024-11-02 ENCOUNTER — Other Ambulatory Visit: Payer: Self-pay | Admitting: Cardiovascular Disease

## 2024-11-06 ENCOUNTER — Ambulatory Visit

## 2024-11-06 DIAGNOSIS — I4891 Unspecified atrial fibrillation: Secondary | ICD-10-CM | POA: Diagnosis not present

## 2024-11-07 LAB — CUP PACEART REMOTE DEVICE CHECK
Battery Remaining Longevity: 140 mo
Battery Voltage: 3.11 V
Brady Statistic AP VP Percent: 0 %
Brady Statistic AP VS Percent: 0 %
Brady Statistic AS VP Percent: 98.56 %
Brady Statistic AS VS Percent: 1.44 %
Brady Statistic RA Percent Paced: 0 %
Brady Statistic RV Percent Paced: 98.64 %
Date Time Interrogation Session: 20260102110019
Implantable Lead Connection Status: 753985
Implantable Lead Connection Status: 753985
Implantable Lead Implant Date: 20110701
Implantable Lead Implant Date: 20110701
Implantable Lead Location: 753859
Implantable Lead Location: 753860
Implantable Lead Model: 5076
Implantable Lead Model: 5076
Implantable Pulse Generator Implant Date: 20250220
Lead Channel Impedance Value: 304 Ohm
Lead Channel Impedance Value: 380 Ohm
Lead Channel Impedance Value: 399 Ohm
Lead Channel Impedance Value: 418 Ohm
Lead Channel Pacing Threshold Amplitude: 0.625 V
Lead Channel Pacing Threshold Pulse Width: 0.4 ms
Lead Channel Sensing Intrinsic Amplitude: 1.375 mV
Lead Channel Sensing Intrinsic Amplitude: 1.375 mV
Lead Channel Sensing Intrinsic Amplitude: 14.25 mV
Lead Channel Sensing Intrinsic Amplitude: 14.25 mV
Lead Channel Setting Pacing Amplitude: 2 V
Lead Channel Setting Pacing Pulse Width: 0.4 ms
Lead Channel Setting Sensing Sensitivity: 2.8 mV
Zone Setting Status: 755011
Zone Setting Status: 755011

## 2024-11-10 NOTE — Progress Notes (Signed)
 Remote PPM Transmission

## 2024-11-11 ENCOUNTER — Ambulatory Visit: Payer: Self-pay | Admitting: Cardiovascular Disease

## 2024-11-16 ENCOUNTER — Ambulatory Visit: Attending: Cardiovascular Disease | Admitting: *Deleted

## 2024-11-16 DIAGNOSIS — Z5181 Encounter for therapeutic drug level monitoring: Secondary | ICD-10-CM | POA: Diagnosis not present

## 2024-11-16 DIAGNOSIS — I4891 Unspecified atrial fibrillation: Secondary | ICD-10-CM | POA: Diagnosis not present

## 2024-11-16 DIAGNOSIS — Z952 Presence of prosthetic heart valve: Secondary | ICD-10-CM | POA: Diagnosis not present

## 2024-11-16 LAB — POCT INR: INR: 5.8 — AB (ref 2.0–3.0)

## 2024-11-16 NOTE — Patient Instructions (Signed)
 Had flu and took tamiflu.  Poor appetite. Hold warfarin x 3 days then resume 2 tablets daily  Recheck in 1 wk

## 2024-11-16 NOTE — Progress Notes (Signed)
 INR 5.8 Please see anticoagulation encounter

## 2024-11-23 ENCOUNTER — Ambulatory Visit: Attending: Cardiovascular Disease | Admitting: *Deleted

## 2024-11-23 DIAGNOSIS — Z5181 Encounter for therapeutic drug level monitoring: Secondary | ICD-10-CM | POA: Diagnosis not present

## 2024-11-23 DIAGNOSIS — Z952 Presence of prosthetic heart valve: Secondary | ICD-10-CM

## 2024-11-23 DIAGNOSIS — I4891 Unspecified atrial fibrillation: Secondary | ICD-10-CM | POA: Diagnosis not present

## 2024-11-23 LAB — POCT INR: INR: 2.1 (ref 2.0–3.0)

## 2024-11-23 NOTE — Progress Notes (Signed)
"  INR 2.1  "

## 2024-11-23 NOTE — Patient Instructions (Signed)
Continue warfarin 2 tablets daily   Recheck in 4 wks

## 2024-12-21 ENCOUNTER — Ambulatory Visit

## 2025-02-05 ENCOUNTER — Encounter

## 2025-05-06 ENCOUNTER — Encounter

## 2025-08-05 ENCOUNTER — Encounter

## 2025-11-04 ENCOUNTER — Encounter
